# Patient Record
Sex: Male | Born: 1945 | Race: White | Hispanic: No | Marital: Married | Smoking: Never smoker
Health system: Southern US, Community
[De-identification: ages and names within clinical notes are randomized; demographics above are authoritative.]

## PROBLEM LIST (undated history)

## (undated) DIAGNOSIS — C4491 Basal cell carcinoma of skin, unspecified: Secondary | ICD-10-CM

## (undated) DIAGNOSIS — M1990 Unspecified osteoarthritis, unspecified site: Secondary | ICD-10-CM

## (undated) DIAGNOSIS — C61 Malignant neoplasm of prostate: Secondary | ICD-10-CM

## (undated) DIAGNOSIS — N400 Enlarged prostate without lower urinary tract symptoms: Secondary | ICD-10-CM

## (undated) DIAGNOSIS — Z5189 Encounter for other specified aftercare: Secondary | ICD-10-CM

## (undated) DIAGNOSIS — I10 Essential (primary) hypertension: Secondary | ICD-10-CM

## (undated) DIAGNOSIS — K219 Gastro-esophageal reflux disease without esophagitis: Secondary | ICD-10-CM

## (undated) DIAGNOSIS — N189 Chronic kidney disease, unspecified: Secondary | ICD-10-CM

## (undated) HISTORY — PX: HAND SURGERY: SHX662

## (undated) HISTORY — DX: Unspecified osteoarthritis, unspecified site: M19.90

## (undated) HISTORY — DX: Malignant neoplasm of prostate: C61

## (undated) HISTORY — DX: Gastro-esophageal reflux disease without esophagitis: K21.9

## (undated) HISTORY — DX: Encounter for other specified aftercare: Z51.89

## (undated) HISTORY — DX: Benign prostatic hyperplasia without lower urinary tract symptoms: N40.0

## (undated) HISTORY — DX: Basal cell carcinoma of skin, unspecified: C44.91

## (undated) HISTORY — DX: Essential (primary) hypertension: I10

## (undated) HISTORY — DX: Chronic kidney disease, unspecified: N18.9

---
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
## 1970-01-13 HISTORY — PX: TONSILLECTOMY: SHX5217

## 1983-01-14 HISTORY — PX: RETINAL DETACHMENT SURGERY: SHX105

## 1996-12-07 ENCOUNTER — Encounter: Payer: Self-pay | Admitting: Internal Medicine

## 2003-12-11 ENCOUNTER — Ambulatory Visit: Payer: Self-pay | Admitting: Internal Medicine

## 2004-01-14 HISTORY — PX: COLONOSCOPY: SHX174

## 2004-05-22 ENCOUNTER — Ambulatory Visit: Payer: Self-pay | Admitting: Family Medicine

## 2004-05-29 ENCOUNTER — Ambulatory Visit: Payer: Self-pay | Admitting: Internal Medicine

## 2004-06-14 ENCOUNTER — Ambulatory Visit: Payer: Self-pay | Admitting: Internal Medicine

## 2004-06-24 ENCOUNTER — Ambulatory Visit: Payer: Self-pay | Admitting: Internal Medicine

## 2004-06-24 LAB — HM COLONOSCOPY: HM Colonoscopy: NORMAL

## 2004-11-27 ENCOUNTER — Ambulatory Visit: Payer: Self-pay | Admitting: Internal Medicine

## 2005-02-05 ENCOUNTER — Ambulatory Visit: Payer: Self-pay | Admitting: Family Medicine

## 2005-03-06 ENCOUNTER — Ambulatory Visit: Payer: Self-pay | Admitting: Internal Medicine

## 2005-05-26 ENCOUNTER — Ambulatory Visit: Payer: Self-pay | Admitting: Internal Medicine

## 2005-05-28 ENCOUNTER — Ambulatory Visit: Payer: Self-pay | Admitting: Ophthalmology

## 2005-07-28 ENCOUNTER — Ambulatory Visit: Payer: Self-pay | Admitting: Family Medicine

## 2005-12-01 ENCOUNTER — Ambulatory Visit: Payer: Self-pay | Admitting: Internal Medicine

## 2006-01-30 ENCOUNTER — Ambulatory Visit: Payer: Self-pay | Admitting: Family Medicine

## 2006-05-25 DIAGNOSIS — I10 Essential (primary) hypertension: Secondary | ICD-10-CM | POA: Insufficient documentation

## 2006-06-01 ENCOUNTER — Ambulatory Visit: Payer: Self-pay | Admitting: Internal Medicine

## 2006-07-08 ENCOUNTER — Ambulatory Visit: Payer: Self-pay | Admitting: Internal Medicine

## 2006-07-08 DIAGNOSIS — R071 Chest pain on breathing: Secondary | ICD-10-CM | POA: Insufficient documentation

## 2006-07-09 ENCOUNTER — Encounter (INDEPENDENT_AMBULATORY_CARE_PROVIDER_SITE_OTHER): Payer: Self-pay | Admitting: *Deleted

## 2006-07-14 ENCOUNTER — Telehealth (INDEPENDENT_AMBULATORY_CARE_PROVIDER_SITE_OTHER): Payer: Self-pay | Admitting: *Deleted

## 2006-10-05 ENCOUNTER — Ambulatory Visit: Payer: Self-pay | Admitting: Internal Medicine

## 2006-10-13 ENCOUNTER — Encounter: Payer: Self-pay | Admitting: Internal Medicine

## 2006-10-13 ENCOUNTER — Ambulatory Visit: Payer: Self-pay

## 2006-11-09 ENCOUNTER — Ambulatory Visit: Payer: Self-pay | Admitting: Family Medicine

## 2006-12-14 ENCOUNTER — Ambulatory Visit: Payer: Self-pay | Admitting: Internal Medicine

## 2006-12-14 DIAGNOSIS — M1990 Unspecified osteoarthritis, unspecified site: Secondary | ICD-10-CM | POA: Insufficient documentation

## 2006-12-15 LAB — CONVERTED CEMR LAB
LDL Cholesterol: 93 mg/dL (ref 0–99)
PSA: 5.29 ng/mL — ABNORMAL HIGH (ref 0.10–4.00)
VLDL: 13 mg/dL (ref 0–40)

## 2006-12-23 ENCOUNTER — Telehealth: Payer: Self-pay | Admitting: Internal Medicine

## 2006-12-24 ENCOUNTER — Ambulatory Visit: Payer: Self-pay | Admitting: Family Medicine

## 2007-03-02 ENCOUNTER — Ambulatory Visit: Payer: Self-pay | Admitting: Internal Medicine

## 2007-03-03 LAB — CONVERTED CEMR LAB
PSA, Free: 0.7 ng/mL
PSA: 3.38 ng/mL (ref 0.10–4.00)

## 2007-06-15 ENCOUNTER — Ambulatory Visit: Payer: Self-pay | Admitting: Internal Medicine

## 2007-07-08 ENCOUNTER — Telehealth (INDEPENDENT_AMBULATORY_CARE_PROVIDER_SITE_OTHER): Payer: Self-pay | Admitting: *Deleted

## 2007-07-12 ENCOUNTER — Emergency Department: Payer: Self-pay | Admitting: Emergency Medicine

## 2007-09-08 ENCOUNTER — Ambulatory Visit: Payer: Self-pay

## 2007-10-14 HISTORY — PX: ROTATOR CUFF REPAIR: SHX139

## 2007-12-06 ENCOUNTER — Ambulatory Visit: Payer: Self-pay | Admitting: Family Medicine

## 2007-12-21 ENCOUNTER — Ambulatory Visit: Payer: Self-pay | Admitting: Internal Medicine

## 2007-12-22 LAB — CONVERTED CEMR LAB
AST: 24 units/L (ref 0–37)
Basophils Absolute: 0 10*3/uL (ref 0.0–0.1)
CO2: 30 meq/L (ref 19–32)
Calcium: 8.9 mg/dL (ref 8.4–10.5)
Glucose, Bld: 94 mg/dL (ref 70–99)
Hemoglobin: 15.1 g/dL (ref 13.0–17.0)
Lymphocytes Relative: 18.6 % (ref 12.0–46.0)
MCHC: 34.5 g/dL (ref 30.0–36.0)
Neutro Abs: 5 10*3/uL (ref 1.4–7.7)
Neutrophils Relative %: 68.5 % (ref 43.0–77.0)
RDW: 12.2 % (ref 11.5–14.6)
Sodium: 138 meq/L (ref 135–145)
TSH: 3.03 microintl units/mL (ref 0.35–5.50)
Total Bilirubin: 1.2 mg/dL (ref 0.3–1.2)

## 2008-02-28 ENCOUNTER — Ambulatory Visit: Payer: Self-pay | Admitting: Family Medicine

## 2008-04-18 ENCOUNTER — Ambulatory Visit: Payer: Self-pay | Admitting: Family Medicine

## 2008-04-18 DIAGNOSIS — M545 Low back pain, unspecified: Secondary | ICD-10-CM | POA: Insufficient documentation

## 2008-04-18 LAB — CONVERTED CEMR LAB
Bilirubin Urine: NEGATIVE
Blood in Urine, dipstick: NEGATIVE
Glucose, Urine, Semiquant: NEGATIVE
Ketones, urine, test strip: NEGATIVE
Specific Gravity, Urine: 1.02
pH: 6

## 2008-04-21 ENCOUNTER — Encounter: Admission: RE | Admit: 2008-04-21 | Discharge: 2008-04-21 | Payer: Self-pay | Admitting: Family Medicine

## 2008-04-25 ENCOUNTER — Encounter (INDEPENDENT_AMBULATORY_CARE_PROVIDER_SITE_OTHER): Payer: Self-pay | Admitting: *Deleted

## 2008-06-13 ENCOUNTER — Telehealth: Payer: Self-pay | Admitting: Internal Medicine

## 2008-07-04 ENCOUNTER — Ambulatory Visit: Payer: Self-pay | Admitting: Internal Medicine

## 2008-07-04 DIAGNOSIS — R5383 Other fatigue: Secondary | ICD-10-CM

## 2008-07-04 DIAGNOSIS — R5381 Other malaise: Secondary | ICD-10-CM | POA: Insufficient documentation

## 2009-01-02 ENCOUNTER — Encounter: Payer: Self-pay | Admitting: Internal Medicine

## 2009-01-03 ENCOUNTER — Ambulatory Visit: Payer: Self-pay | Admitting: Internal Medicine

## 2009-01-03 DIAGNOSIS — N401 Enlarged prostate with lower urinary tract symptoms: Secondary | ICD-10-CM

## 2009-01-03 DIAGNOSIS — N138 Other obstructive and reflux uropathy: Secondary | ICD-10-CM | POA: Insufficient documentation

## 2009-01-03 DIAGNOSIS — L909 Atrophic disorder of skin, unspecified: Secondary | ICD-10-CM | POA: Insufficient documentation

## 2009-01-03 DIAGNOSIS — L919 Hypertrophic disorder of the skin, unspecified: Secondary | ICD-10-CM

## 2009-01-03 DIAGNOSIS — R079 Chest pain, unspecified: Secondary | ICD-10-CM | POA: Insufficient documentation

## 2009-01-09 LAB — CONVERTED CEMR LAB
ALT: 30 U/L
AST: 29 U/L
Albumin: 4.1 g/dL
Alkaline Phosphatase: 57 U/L
BUN: 13 mg/dL
Basophils Absolute: 0 10*3/uL
Basophils Relative: 0.5 %
Bilirubin, Direct: 0.2 mg/dL
CO2: 31 meq/L
Calcium: 9.2 mg/dL
Chloride: 105 meq/L
Creatinine, Ser: 1.4 mg/dL
Eosinophils Absolute: 0.1 10*3/uL
Eosinophils Relative: 2.2 %
GFR calc non Af Amer: 54.37 mL/min
Glucose, Bld: 94 mg/dL
HCT: 51.1 %
Hemoglobin: 16.4 g/dL
Lymphocytes Relative: 19.3 %
Lymphs Abs: 1.3 10*3/uL
MCHC: 32.2 g/dL
MCV: 97 fL
Monocytes Absolute: 0.6 10*3/uL
Monocytes Relative: 9.8 %
Neutro Abs: 4.6 10*3/uL
Neutrophils Relative %: 68.2 %
PSA: 5.52 ng/mL — ABNORMAL HIGH
Phosphorus: 3.5 mg/dL
Platelets: 207 10*3/uL
Potassium: 4.6 meq/L
RBC: 5.26 M/uL
RDW: 12.2 %
Sodium: 141 meq/L
TSH: 4.83 u[IU]/mL
Total Bilirubin: 1.1 mg/dL
Total Protein: 7.1 g/dL
WBC: 6.6 10*3/uL

## 2009-01-13 HISTORY — PX: PROSTATE BIOPSY: SHX241

## 2009-01-18 ENCOUNTER — Ambulatory Visit: Payer: Self-pay | Admitting: Family Medicine

## 2009-01-18 LAB — CONVERTED CEMR LAB
Epithelial cells, urine: 0 /lpf
Ketones, urine, test strip: NEGATIVE
Nitrite: NEGATIVE
Protein, U semiquant: NEGATIVE
Urobilinogen, UA: 0.2
WBC, UA: 0 cells/hpf

## 2009-01-19 ENCOUNTER — Telehealth: Payer: Self-pay | Admitting: Internal Medicine

## 2009-01-31 ENCOUNTER — Encounter: Payer: Self-pay | Admitting: Internal Medicine

## 2009-07-06 ENCOUNTER — Ambulatory Visit: Payer: Self-pay | Admitting: Internal Medicine

## 2009-09-27 ENCOUNTER — Telehealth: Payer: Self-pay | Admitting: Internal Medicine

## 2009-12-26 ENCOUNTER — Ambulatory Visit: Payer: Self-pay | Admitting: Internal Medicine

## 2009-12-26 DIAGNOSIS — B029 Zoster without complications: Secondary | ICD-10-CM | POA: Insufficient documentation

## 2010-01-11 ENCOUNTER — Ambulatory Visit: Payer: Self-pay | Admitting: Internal Medicine

## 2010-01-15 LAB — CONVERTED CEMR LAB
Albumin: 3.9 g/dL (ref 3.5–5.2)
Alkaline Phosphatase: 51 units/L (ref 39–117)
BUN: 15 mg/dL (ref 6–23)
Basophils Absolute: 0 10*3/uL (ref 0.0–0.1)
Basophils Relative: 0.4 % (ref 0.0–3.0)
CO2: 30 meq/L (ref 19–32)
Calcium: 9 mg/dL (ref 8.4–10.5)
Chloride: 103 meq/L (ref 96–112)
Cholesterol: 125 mg/dL (ref 0–200)
Eosinophils Absolute: 0.2 10*3/uL (ref 0.0–0.7)
HCT: 46.4 % (ref 39.0–52.0)
Hemoglobin: 15.9 g/dL (ref 13.0–17.0)
LDL Cholesterol: 73 mg/dL (ref 0–99)
Lymphs Abs: 1.6 10*3/uL (ref 0.7–4.0)
MCHC: 34.2 g/dL (ref 30.0–36.0)
MCV: 95.6 fL (ref 78.0–100.0)
Neutro Abs: 4.5 10*3/uL (ref 1.4–7.7)
Phosphorus: 3.3 mg/dL (ref 2.3–4.6)
Potassium: 4.7 meq/L (ref 3.5–5.1)
RBC: 4.86 M/uL (ref 4.22–5.81)
RDW: 13.4 % (ref 11.5–14.6)
TSH: 4.68 microintl units/mL (ref 0.35–5.50)
Total Protein: 6.8 g/dL (ref 6.0–8.3)
Triglycerides: 104 mg/dL (ref 0.0–149.0)

## 2010-02-10 LAB — CONVERTED CEMR LAB
ALT: 22 units/L (ref 0–53)
Alkaline Phosphatase: 60 units/L (ref 39–117)
Basophils Relative: 0.3 % (ref 0.0–1.0)
Eosinophils Relative: 1.7 % (ref 0.0–5.0)
GFR calc Af Amer: 79 mL/min
Glucose, Bld: 98 mg/dL (ref 70–99)
HCT: 43.5 % (ref 39.0–52.0)
Lymphocytes Relative: 16.1 % (ref 12.0–46.0)
Neutrophils Relative %: 73.6 % (ref 43.0–77.0)
Phosphorus: 3.3 mg/dL (ref 2.3–4.6)
Platelets: 217 10*3/uL (ref 150–400)
Potassium: 4.3 meq/L (ref 3.5–5.1)
RBC: 4.71 M/uL (ref 4.22–5.81)
Sodium: 144 meq/L (ref 135–145)
Total Bilirubin: 1 mg/dL (ref 0.3–1.2)
Total Protein: 6.3 g/dL (ref 6.0–8.3)
WBC: 7.1 10*3/uL (ref 4.5–10.5)

## 2010-02-13 NOTE — Progress Notes (Signed)
Summary: finasteride  Phone Note Call from Patient Call back at Home Phone [phone]   Caller: Patient Call For: Cindee Salt MD Summary of Call: Patient says that you had discussed putting him on finasteride at his last office visit. He is asking if he could have this called to [facility] in [place].  Initial call taken by: Melody Comas,  September 27, 2009 1:46 PM  Follow-up for Phone Call        Let him know that Rx was sent Have him call if he has more trouble voiding with the change in meds Follow-up by: Cindee Salt MD,  September 27, 2009 2:50 PM  Additional Follow-up for Phone Call Additional follow up Details #1::        LMOM at home that rx was called in and to call if any problems. Additional Follow-up by: Mervin Hack CMA Duncan Dull),  September 27, 2009 4:20 PM    New/Updated Medications: FINASTERIDE 5 MG TABS (FINASTERIDE) 1 tab daily to reduce prostate size Prescriptions: FINASTERIDE 5 MG TABS (FINASTERIDE) 1 tab daily to reduce prostate size  #30 x 11   Entered and Authorized by:   Cindee Salt MD   Signed by:   Cindee Salt MD on 09/27/2009   Method used:   Electronically to        [facility] (retail)       [address]       [place], [place]  [address]       Ph: [phone]       Fax: [phone]   RxID:   [id number]
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
## 2010-02-13 NOTE — Progress Notes (Signed)
Summary: Rx Flomax  Phone Note Call from Patient   Caller: Patient Call For: Cindee Salt MD Summary of Call: Patient called says he saw Billie on yesterday and she sent in a Rx for him for Flomax.  He said he went to pharmacy to get Rx and it was not there.  Called [facility] and spoke to the pharmacist, the Rx was sent electronically and it has been filled.  Patient notified.   Initial call taken by: Linde Gillis CMA (AAMA),  January 19, 2009 11:22 AM

## 2010-02-13 NOTE — Assessment & Plan Note (Signed)
Summary: PROSTATE/DS   Vital Signs:  Patient profile:   65 year old male Height:      64 inches Weight:      194 pounds BMI:     33.42 Temp:     98.7 degrees F oral Pulse rate:   64 / minute Pulse rhythm:   regular BP sitting:   118 / 78  (left arm) Cuff size:   large  Vitals Entered By: Lewanda Rife LPN (January 18, 2009 2:28 PM)  CC:  prostate and void small amts frequently and elevated PSA.  History of Present Illness: Here due to small frequent voidings--worse past 3days--more frequent, no fever or chills --testicles "uncomfortable" past few days PSA elevated on 01/03/2009 to 5.552, up from 3.38 on 03/02/2007.--to repeat PAS in 3 mo and refer if elevated is the current plan  pain in RUQ when bends over tie shoes  sudden onset of diarrhea thiis morning after bkft--has resolved  drinks coffee  ~16oz once daily            tea--16 oz once daily            no sodas            some chocolate  Allergies (verified): No Known Drug Allergies  Past History:  Past Medical History: Reviewed history from 01/03/2009 and no changes required. GERD Hypertension Osteoarthritis Benign prostatic hypertrophy  Review of Systems      See HPI  Physical Exam  General:  alert, well-developed, well-nourished, and well-hydrated.   Rectal:  normal sphincter tone and no masses.   Prostate:  no nodules and no asymmetry.  not tender Neurologic:  alert & oriented X3 and gait normal.   Psych:  normally interactive and slightly anxious.     Impression & Recommendations:  Problem # 1:  BENIGN PROSTATIC HYPERTROPHY (ICD-600.00) Assessment Deteriorated  discussed presentation with Dr Erick Alley start on Flomax now will refer to urology due to increased retension His updated medication list for this problem includes:    Flomax 0.4 Mg Caps (Tamsulosin hcl) .Marland Kitchen... Take 1 daily for bph  Orders: UA Dipstick W/ Micro (manual) (04540)  Problem # 2:  ELEVATED PROSTATE SPECIFIC ANTIGEN  (ICD-790.93) Assessment: Comment Only referral to urology for eval Orders: Urology Referral (Urology)  Complete Medication List: 1)  Aspirin 81 Mg Tabs (Aspirin) .... Take one by mouth daily 2)  Atenolol 25 Mg Tabs (Atenolol) .... Take 1/2 by mouth daily 3)  Omeprazole 20 Mg Tbec (Omeprazole) .... Take 1 tab daily 4)  Amlodipine Besylate 5 Mg Tabs (Amlodipine besylate) .Marland Kitchen.. 1 tab daily for high blood pressure 5)  Flomax 0.4 Mg Caps (Tamsulosin hcl) .... Take 1 daily for bph  Patient Instructions: 1)  refer to urology Prescriptions: FLOMAX 0.4 MG CAPS (TAMSULOSIN HCL) take 1 daily for BPH  #30 x 1   Entered and Authorized by:   Gildardo Griffes FNP   Signed by:   Delilah Shan CMA (AAMA) on 01/19/2009   Method used:   Electronically to        [facility] (retail)       [address]       [place], [place]  [address]       Ph: [phone]       Fax: [phone]   RxID:   [id number]   Current Allergies (reviewed today): No known allergies   Laboratory Results   Urine  Tests  Date/Time Received: January 18, 2009 2:51 PM  Date/Time Reported: January 18, 2009 2:51 PM   Routine Urinalysis   Color: yellow Appearance: Clear Glucose: negative   (Normal Range: Negative) Bilirubin: negative   (Normal Range: Negative) Ketone: negative   (Normal Range: Negative) Spec. Gravity: 1.010   (Normal Range: 1.003-1.035) Blood: trace-lysed   (Normal Range: Negative) pH: 6.0   (Normal Range: 5.0-8.0) Protein: negative   (Normal Range: Negative) Urobilinogen: 0.2   (Normal Range: 0-1) Nitrite: negative   (Normal Range: Negative) Leukocyte Esterace: negative   (Normal Range: Negative)  Urine Microscopic WBC/HPF: 0 RBC/HPF: 0-1 Bacteria/HPF: 0 Epithelial/HPF: 0        Laboratory Results   Urine Tests    Routine Urinalysis   Color: yellow Appearance: Clear Glucose: negative   (Normal Range: Negative) Bilirubin: negative   (Normal  Range: Negative) Ketone: negative   (Normal Range: Negative) Spec. Gravity: 1.010   (Normal Range: 1.003-1.035) Blood: trace-lysed   (Normal Range: Negative) pH: 6.0   (Normal Range: 5.0-8.0) Protein: negative   (Normal Range: Negative) Urobilinogen: 0.2   (Normal Range: 0-1) Nitrite: negative   (Normal Range: Negative) Leukocyte Esterace: negative   (Normal Range: Negative)  Urine Microscopic WBC/hpf: 0 RBC/hpf: 0-1 Bacteria: 0 Epithelial: 0
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
## 2010-02-14 NOTE — Assessment & Plan Note (Signed)
Summary: ?RASH ON CHEST AND BACK/CLE   Vital Signs:  Patient profile:   65 year old male Height:      64 inches Weight:      192 pounds BMI:     33.08 Temp:     99.4 degrees F oral Pulse rate:   60 / minute Pulse rhythm:   regular BP sitting:   126 / 80  (left arm) Cuff size:   large  Vitals Entered By: Selena Batten Dance CMA Duncan Dull) (December 26, 2009 9:23 AM) CC: Rash on chest and back   History of Present Illness: CC: rash  4d h/o rash on back and chest.  Started feeling irritation on back as well as burning.  not really itchy. more burning.  h/o chicken pox in past  No h/o previous rash.  No change in detergents/soap/shampoo.  Does go outside often, but not recently in brush/woods.  No fevers/chills, abd pain, n/v, trouble breathing or oral lesions.  New medicine finasteride about 3 months ago started.  (previously on jalyn)  + coworker with shingles last week.  Current Medications (verified): 1)  Atenolol 25 Mg Tabs (Atenolol) .... Take 1/2 By Mouth Daily 2)  Amlodipine Besylate 5 Mg Tabs (Amlodipine Besylate) .Marland Kitchen.. 1 Tab Daily For High Blood Pressure 3)  Aspirin 81 Mg Tabs (Aspirin) .... Take One By Mouth Daily 4)  Omeprazole 20 Mg Tbec (Omeprazole) .... Take 1 Tab Daily 5)  Finasteride 5 Mg Tabs (Finasteride) .Marland Kitchen.. 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   Lungs:  normal respiratory effort and normal breath sounds.   Heart:  normal rate, regular rhythm, no murmur, and no gallop.   Skin:  erythematous cluster of blisters  R T4 dermatomal distribution chest, axilla, back.   Impression & Recommendations:  Problem # 1:  SHINGLES (ICD-053.9) 4-5 days into course.  treat with acyclovir to help prevent PHN, ibuprofen for discomfort.  handout on shingles provided with care instructions.  Complete Medication List: 1)  Atenolol 25 Mg Tabs (Atenolol) .... Take 1/2 by mouth daily 2)  Amlodipine Besylate 5 Mg Tabs (Amlodipine besylate) .Marland Kitchen.. 1 tab daily for high blood pressure 3)  Aspirin 81 Mg Tabs (Aspirin) .... Take one by mouth daily 4)  Omeprazole 20 Mg Tbec (Omeprazole) .... Take 1 tab daily 5)  Finasteride 5 Mg Tabs (Finasteride) .Marland Kitchen.. 1 tab daily to reduce prostate size 6)  Acyclovir 800 Mg Tabs (Acyclovir) .... One by mouth 5x/day x 7 days  Patient Instructions: 1)  You have shingles. 2)  Take acyclovir 5 x/day for 7 days. 3)  Handout provided. 4)  Ibuprofen for pain/discomfort. 5)  Keep area covered and as clean and dry as able. 6)  Let us know if not getting better as expected. Prescriptions: ACYCLOVIR 800 MG TABS (ACYCLOVIR) one by mouth 5x/day x 7 days  #35 x 0   Entered and Authorized by:   Eustaquio Boyden  MD   Signed by:   Eustaquio Boyden  MD on 12/26/2009   Method used:   Electronically to  [facility] (retail)       [address]       [place], [place]  [address]       Ph: [phone]       Fax: [phone]   RxID:   [id number]    Orders Added: 1)  Est. Patient Level III [40102]    Current Allergies (reviewed today): No known allergies

## 2010-04-22 ENCOUNTER — Other Ambulatory Visit: Payer: Self-pay | Admitting: *Deleted

## 2010-04-22 MED ORDER — OMEPRAZOLE MAGNESIUM 20 MG PO TBEC: 20.0000 mg | DELAYED_RELEASE_TABLET | Freq: Every day | ORAL | Status: DC

## 2010-05-30 ENCOUNTER — Other Ambulatory Visit: Payer: Self-pay | Admitting: *Deleted

## 2010-05-30 MED ORDER — AMLODIPINE BESYLATE 5 MG PO TABS: 5.0000 mg | ORAL_TABLET | Freq: Every day | ORAL | Status: DC

## 2010-07-16 ENCOUNTER — Encounter: Payer: Self-pay | Admitting: Internal Medicine

## 2010-07-18 ENCOUNTER — Other Ambulatory Visit: Payer: Self-pay | Admitting: *Deleted

## 2010-07-18 MED ORDER — ATENOLOL 25 MG PO TABS: ORAL_TABLET | ORAL | Status: DC

## 2010-07-18 NOTE — Telephone Encounter (Signed)
rx sent to pharmacy by e-script  

## 2010-07-19 ENCOUNTER — Ambulatory Visit: Payer: Self-pay | Admitting: Internal Medicine

## 2010-07-19 ENCOUNTER — Encounter: Payer: Self-pay | Admitting: Internal Medicine

## 2010-07-19 ENCOUNTER — Ambulatory Visit (INDEPENDENT_AMBULATORY_CARE_PROVIDER_SITE_OTHER): Payer: BC Managed Care – PPO | Admitting: Internal Medicine

## 2010-07-19 VITALS — BP 133/78 | HR 54 | Temp 98.3°F | Ht 64.0 in | Wt 191.0 lb

## 2010-07-19 DIAGNOSIS — K219 Gastro-esophageal reflux disease without esophagitis: Secondary | ICD-10-CM | POA: Insufficient documentation

## 2010-07-19 DIAGNOSIS — N400 Enlarged prostate without lower urinary tract symptoms: Secondary | ICD-10-CM

## 2010-07-19 DIAGNOSIS — I10 Essential (primary) hypertension: Secondary | ICD-10-CM

## 2010-07-19 DIAGNOSIS — R079 Chest pain, unspecified: Secondary | ICD-10-CM

## 2010-07-19 MED ORDER — ATENOLOL 25 MG PO TABS: ORAL_TABLET | ORAL | Status: DC

## 2010-07-19 NOTE — Assessment & Plan Note (Signed)
Does okay on the med 

## 2010-07-19 NOTE — Assessment & Plan Note (Signed)
Atypical pain Stress test in past EKG shows bradycardia from beta blocker and J point elevation--no ischemic changes No further workup unless more worrisome symtpoms--discussed with him

## 2010-07-19 NOTE — Assessment & Plan Note (Signed)
BP Readings from Last 3 Encounters:  07/19/10 133/78  01/11/10 148/90  12/26/09 126/80   Good control  No changes needed

## 2010-07-19 NOTE — Assessment & Plan Note (Signed)
Voids okay on finasteride No change

## 2010-07-19 NOTE — Progress Notes (Signed)
Subjective:    Patient ID: Bobby Woods, male    DOB: [birth year], 65 y.o.   MRN: [id number]  HPI Doing okay Still having some stress with wife's issues "I have been taking care of everyone but me"  Has had some chest pains since last visit--none in several weeks Not exertional Feels like aching on left Would last for hours No SOB No ankle edema but some swelling above ankles after sitting all day at work  Occ checks BP Usually good 120's/80's  Voiding okay Nocturia 1-2 at night Feels the finasteride has helped  Some spine arthritis Other aches and pains No meds now  Current Outpatient Prescriptions on File Prior to Visit  Medication Sig Dispense Refill  . amLODipine (NORVASC) 5 MG tablet Take 1 tablet (5 mg total) by mouth daily.  30 tablet  11  . aspirin 81 MG tablet Take 81 mg by mouth daily.        Marland Kitchen atenolol (TENORMIN) 25 MG tablet Take 1/2 tablet by mouth once daily  45 tablet  0  . finasteride (PROSCAR) 5 MG tablet Take 5 mg by mouth daily.        Marland Kitchen omeprazole (PRILOSEC OTC) 20 MG tablet Take 1 tablet (20 mg total) by mouth daily.  28 tablet  1  . DISCONTD: traMADol (ULTRAM) 50 MG tablet Take 50-100 mg by mouth at bedtime as needed.          No Known Allergies  Past Medical History  Diagnosis Date  . GERD (gastroesophageal reflux disease)   . Hypertension   . Arthritis   . BPH (benign prostatic hypertrophy)     Past Surgical History  Procedure Date  . Inguinal hernia repair 1965  . Tonsillectomy 1972  . Retinal detachment surgery 1985    left, right 05/07  . Rotator cuff repair 10/09    right  . Prostate biopsy 2011    negative    Family History  Problem Relation Age of Onset  . Cancer Father     prostate  . Cancer Paternal Uncle     prostate cancer    History   Social History  . Marital Status: Married    Spouse Name: N/A    Number of Children: 3  . Years of Education: N/A   Occupational History  . Art therapist    Social  History Main Topics  . Smoking status: Never Smoker   . Smokeless tobacco: Never Used  . Alcohol Use: No  . Drug Use: No  . Sexually Active: Not on file   Other Topics Concern  . Not on file   Social History Narrative  . No narrative on file   Review of Systems Appetite is fine Weight is stable Generally sleeps okay Hasn't had time to exercise with caregiving for dad and wife No reflux problems as long as he takes the omeprazole     Objective:   Physical Exam  Constitutional: He appears well-developed and well-nourished. No distress.  Neck: Normal range of motion. Neck supple. No thyromegaly present.  Cardiovascular: Normal rate, regular rhythm, normal heart sounds and intact distal pulses.  Exam reveals no gallop.   No murmur heard. Pulmonary/Chest: Effort normal and breath sounds normal. No respiratory distress. He has no wheezes. He has no rales.  Abdominal: Soft. He exhibits no mass. There is no tenderness.  Musculoskeletal: Normal range of motion. He exhibits no edema and no tenderness.  Lymphadenopathy:    He has no cervical  adenopathy.  Psychiatric: He has a normal mood and affect. His behavior is normal. Judgment and thought content normal.          Assessment & Plan:

## 2010-07-23 ENCOUNTER — Other Ambulatory Visit: Payer: Self-pay | Admitting: *Deleted

## 2010-07-23 MED ORDER — OMEPRAZOLE MAGNESIUM 20 MG PO TBEC: 20.0000 mg | DELAYED_RELEASE_TABLET | Freq: Every day | ORAL | Status: DC

## 2010-07-23 NOTE — Telephone Encounter (Signed)
Received faxed refill request from pharmacy. Rx sent to pharmacy electronically. 

## 2010-08-12 ENCOUNTER — Encounter: Payer: Self-pay | Admitting: Family Medicine

## 2010-08-12 ENCOUNTER — Ambulatory Visit (INDEPENDENT_AMBULATORY_CARE_PROVIDER_SITE_OTHER): Payer: BC Managed Care – PPO | Admitting: Family Medicine

## 2010-08-12 DIAGNOSIS — B029 Zoster without complications: Secondary | ICD-10-CM

## 2010-08-12 MED ORDER — VALACYCLOVIR HCL 1 G PO TABS: 1000.0000 mg | ORAL_TABLET | Freq: Three times a day (TID) | ORAL | Status: DC

## 2010-08-12 NOTE — Assessment & Plan Note (Signed)
Likely atypical presentation of zoster.  Start valtrex, out of work until crusted and f/u prn.  He agrees.

## 2010-08-12 NOTE — Patient Instructions (Signed)
Start the valtrex today.  Out of work until rash is scabbed over.  Let us know if you have other concerns.  Take care.

## 2010-08-12 NOTE — Progress Notes (Signed)
Last Wednesday or Thursday-he felt burning and stinging in the L upper back (this was the site of the prev zoster), but no rash.  Now with rash on B medial thighs and medial forearms.  Itches and stings.  No help with topical cortisone.   Feels like shingles.    Feeling well o/w.  Meds, vitals, and allergies reviewed.   ROS: See HPI.  Otherwise, noncontributory.  nad ncat Mmm Back and torso wnl on skin exam except for hypersensitivity on L upper back Medial thighs x2  and forearms x2 with dermatomal rash- erythematous blanching maculopapular rash with occ vesicles

## 2010-08-16 ENCOUNTER — Telehealth: Payer: Self-pay | Admitting: *Deleted

## 2010-08-16 NOTE — Telephone Encounter (Signed)
I would continue this for now.  If he continues to have troubles or new symptoms, then I'd like to see him next week.  Thanks.

## 2010-08-16 NOTE — Telephone Encounter (Signed)
Pt has been taking valtrex for shingles since Monday.  He continues to get new bumps.  He's taking 1000 mg's 3 times a day.  Any suggestions for what he can do.

## 2010-08-16 NOTE — Telephone Encounter (Signed)
Left message on machine asking pt to call back. 

## 2010-08-20 NOTE — Telephone Encounter (Signed)
Left message on machine at home for patient to return call. 

## 2010-08-21 NOTE — Telephone Encounter (Signed)
.  left message to have patient return my call.  

## 2010-08-21 NOTE — Telephone Encounter (Signed)
I usually advise waiting a while after an attack (like a year) before getting the vaccine

## 2010-08-21 NOTE — Telephone Encounter (Signed)
Patient advised as instructed via telephone.  He stated that the blisters are scabbing over, there is no redness like there was before.  He is still itching a little but he feels better than he did before.  He wanted to know if he should at some point get the Shingles vaccine?  Please advise.

## 2010-08-22 NOTE — Telephone Encounter (Signed)
Spoke with patient and advised results   

## 2010-09-10 ENCOUNTER — Other Ambulatory Visit: Payer: Self-pay | Admitting: *Deleted

## 2010-09-10 MED ORDER — AMLODIPINE BESYLATE 5 MG PO TABS: 5.0000 mg | ORAL_TABLET | Freq: Every day | ORAL | Status: DC

## 2010-09-10 NOTE — Telephone Encounter (Signed)
rx sent to pharmacy by e-script  

## 2010-10-24 ENCOUNTER — Other Ambulatory Visit: Payer: Self-pay | Admitting: *Deleted

## 2010-10-24 MED ORDER — FINASTERIDE 5 MG PO TABS: 5.0000 mg | ORAL_TABLET | Freq: Every day | ORAL | Status: DC

## 2010-12-09 ENCOUNTER — Other Ambulatory Visit: Payer: Self-pay | Admitting: *Deleted

## 2010-12-09 ENCOUNTER — Other Ambulatory Visit: Payer: Self-pay | Admitting: Family Medicine

## 2010-12-09 ENCOUNTER — Encounter: Payer: Self-pay | Admitting: Family Medicine

## 2010-12-09 ENCOUNTER — Encounter: Payer: Self-pay | Admitting: *Deleted

## 2010-12-09 ENCOUNTER — Ambulatory Visit (INDEPENDENT_AMBULATORY_CARE_PROVIDER_SITE_OTHER): Payer: BC Managed Care – PPO | Admitting: Family Medicine

## 2010-12-09 VITALS — BP 150/90 | HR 93 | Temp 98.7°F | Ht 64.0 in | Wt 189.0 lb

## 2010-12-09 DIAGNOSIS — J209 Acute bronchitis, unspecified: Secondary | ICD-10-CM

## 2010-12-09 DIAGNOSIS — I10 Essential (primary) hypertension: Secondary | ICD-10-CM

## 2010-12-09 MED ORDER — AZITHROMYCIN 250 MG PO TABS: ORAL_TABLET | ORAL | Status: AC

## 2010-12-09 NOTE — Progress Notes (Signed)
  Subjective:    Patient ID: Bobby Woods, male    DOB: February 09, 1945, 65 y.o.   MRN: [id number]  Sinusitis The current episode started 1 to 4 weeks ago. There has been no fever. He is experiencing no pain. Associated symptoms include congestion, coughing, headaches and a sore throat. Pertinent negatives include no chills, ear pain, shortness of breath, sinus pressure or sneezing. (Dry cough, occ yellow productive) Treatments tried: ricola cough drops.   4 family members with illness at home..bronchitis or sinus infection. Was around dust over weekend.  Hypertension:  Elevated today...has not taken medication yesterday or today yet. Using medication without problems or lightheadedness:  Chest pain with exertion:None Edema:None Short of breath:None Average home BPs: Not checking. Other issues:    Review of Systems  Constitutional: Negative for chills.  HENT: Positive for congestion and sore throat. Negative for ear pain, sneezing and sinus pressure.   Respiratory: Positive for cough. Negative for shortness of breath.   Neurological: Positive for headaches.       Objective:   Physical Exam  Constitutional: Vital signs are normal. He appears well-developed and well-nourished.  Non-toxic appearance. He does not appear ill. No distress.  HENT:  Head: Normocephalic and atraumatic.  Right Ear: Hearing, tympanic membrane, external ear and ear canal normal. No tenderness. No foreign bodies. Tympanic membrane is not retracted and not bulging.  Left Ear: Hearing, tympanic membrane, external ear and ear canal normal. No tenderness. No foreign bodies. Tympanic membrane is not retracted and not bulging.  Nose: Nose normal. No mucosal edema or rhinorrhea. Right sinus exhibits no maxillary sinus tenderness and no frontal sinus tenderness. Left sinus exhibits no maxillary sinus tenderness and no frontal sinus tenderness.  Mouth/Throat: Uvula is midline, oropharynx is clear and moist and mucous  membranes are normal. Normal dentition. No dental caries. No oropharyngeal exudate or tonsillar abscesses.  Eyes: Conjunctivae, EOM and lids are normal. Pupils are equal, round, and reactive to light. No foreign bodies found.  Neck: Trachea normal, normal range of motion and phonation normal. Neck supple. Carotid bruit is not present. No mass and no thyromegaly present.  Cardiovascular: Normal rate, regular rhythm, S1 normal, S2 normal, normal heart sounds, intact distal pulses and normal pulses.  Exam reveals no gallop.   No murmur heard. Pulmonary/Chest: Effort normal and breath sounds normal. No respiratory distress. He has no wheezes. He has no rhonchi. He has no rales.  Abdominal: Soft. Normal appearance and bowel sounds are normal. There is no hepatosplenomegaly. There is no tenderness. There is no rebound, no guarding and no CVA tenderness. No hernia.  Neurological: He is alert. He has normal reflexes.  Skin: Skin is warm, dry and intact. No rash noted.  Psychiatric: He has a normal mood and affect. His speech is normal and behavior is normal. Judgment normal.          Assessment & Plan:

## 2010-12-09 NOTE — Assessment & Plan Note (Signed)
Will treat with antibiotics given present symptoms greater than 10 days. NO cough med indicated. Start mucinex DM.

## 2010-12-09 NOTE — Assessment & Plan Note (Signed)
Bp elevated due to not taking med in 2 days... Pt encouraged to take med daily.

## 2010-12-09 NOTE — Patient Instructions (Addendum)
Make sure taking medication everyday to control blood pressure.  Follow BP at home.. Goal <140/90. Start mucinex DM to break up mucus and treat cough. Start antibiotic for likely bacterial bronchitis.  Call if not  Improving as expected.

## 2010-12-27 ENCOUNTER — Ambulatory Visit (INDEPENDENT_AMBULATORY_CARE_PROVIDER_SITE_OTHER): Payer: BC Managed Care – PPO | Admitting: Family Medicine

## 2010-12-27 ENCOUNTER — Encounter: Payer: Self-pay | Admitting: Family Medicine

## 2010-12-27 DIAGNOSIS — J329 Chronic sinusitis, unspecified: Secondary | ICD-10-CM

## 2010-12-27 MED ORDER — BENZONATATE 200 MG PO CAPS: 200.0000 mg | ORAL_CAPSULE | Freq: Three times a day (TID) | ORAL | Status: AC | PRN

## 2010-12-27 MED ORDER — AMOXICILLIN 875 MG PO TABS: 875.0000 mg | ORAL_TABLET | Freq: Two times a day (BID) | ORAL | Status: AC

## 2010-12-27 NOTE — Progress Notes (Signed)
Was seen 12/09/10, rx'd zmax and had diarrhea from that med but he overall improved.  Now with return of sx:  duration of symptoms: 1 week  Rhinorrhea: no Congestion: no ear pain: no sore throat: yes, scratchy Cough: yes, no sputum Myalgias: no other concerns: facial pain Felt hot the last few nights "I just feel bad."    ROS: See HPI.  Otherwise negative.    Meds, vitals, and allergies reviewed.   GEN: nad, alert and oriented HEENT: mucous membranes moist, TM w/o erythema, nasal epithelium injected, OP with cobblestoning, Max sinus ttp NECK: supple w/o LA CV: rrr. PULM: ctab, no inc wob ABD: soft, +bs EXT: no edema

## 2010-12-27 NOTE — Patient Instructions (Signed)
Drink plenty of fluids, take tylenol as needed, and gargle with warm salt water for your throat.  Take the amoxil for 10 days.  Take the tessalon for cough.  This should gradually improve.  Take care.  Let us know if you have other concerns.

## 2010-12-29 ENCOUNTER — Encounter: Payer: Self-pay | Admitting: Family Medicine

## 2010-12-29 DIAGNOSIS — J329 Chronic sinusitis, unspecified: Secondary | ICD-10-CM | POA: Insufficient documentation

## 2010-12-29 NOTE — Assessment & Plan Note (Signed)
Nontoxic, but given the exam, I would treat.  Use Amoxil and tessalon, rest and fluids, f/u prn.  He agrees.

## 2011-01-08 ENCOUNTER — Other Ambulatory Visit: Payer: Self-pay | Admitting: Internal Medicine

## 2011-01-08 DIAGNOSIS — I10 Essential (primary) hypertension: Secondary | ICD-10-CM

## 2011-01-15 ENCOUNTER — Other Ambulatory Visit (INDEPENDENT_AMBULATORY_CARE_PROVIDER_SITE_OTHER): Payer: BC Managed Care – PPO

## 2011-01-15 DIAGNOSIS — I10 Essential (primary) hypertension: Secondary | ICD-10-CM

## 2011-01-15 LAB — HEPATIC FUNCTION PANEL
AST: 24 U/L (ref 0–37)
Alkaline Phosphatase: 52 U/L (ref 39–117)
Bilirubin, Direct: 0.1 mg/dL (ref 0.0–0.3)

## 2011-01-15 LAB — CBC WITH DIFFERENTIAL/PLATELET
Basophils Absolute: 0 10*3/uL (ref 0.0–0.1)
Eosinophils Absolute: 0.2 10*3/uL (ref 0.0–0.7)
Hemoglobin: 15.2 g/dL (ref 13.0–17.0)
Lymphocytes Relative: 17 % (ref 12.0–46.0)
MCHC: 33.8 g/dL (ref 30.0–36.0)
Monocytes Relative: 9 % (ref 3.0–12.0)
Neutrophils Relative %: 71 % (ref 43.0–77.0)
RBC: 4.78 Mil/uL (ref 4.22–5.81)
RDW: 12.9 % (ref 11.5–14.6)

## 2011-01-15 LAB — BASIC METABOLIC PANEL
CO2: 28 mEq/L (ref 19–32)
Calcium: 8.7 mg/dL (ref 8.4–10.5)
Creatinine, Ser: 1.3 mg/dL (ref 0.4–1.5)
GFR: 57.82 mL/min — ABNORMAL LOW (ref >60.00)
Glucose, Bld: 105 mg/dL — ABNORMAL HIGH (ref 70–99)
Sodium: 142 mEq/L (ref 135–145)

## 2011-01-15 LAB — TSH: TSH: 4.67 u[IU]/mL (ref 0.35–5.50)

## 2011-01-22 ENCOUNTER — Encounter: Payer: Self-pay | Admitting: Internal Medicine

## 2011-01-22 ENCOUNTER — Ambulatory Visit (INDEPENDENT_AMBULATORY_CARE_PROVIDER_SITE_OTHER): Payer: BC Managed Care – PPO | Admitting: Internal Medicine

## 2011-01-22 VITALS — BP 112/78 | HR 56 | Temp 97.7°F | Ht 64.0 in | Wt 189.0 lb

## 2011-01-22 DIAGNOSIS — Z0000 Encounter for general adult medical examination without abnormal findings: Secondary | ICD-10-CM | POA: Insufficient documentation

## 2011-01-22 DIAGNOSIS — I10 Essential (primary) hypertension: Secondary | ICD-10-CM

## 2011-01-22 DIAGNOSIS — N400 Enlarged prostate without lower urinary tract symptoms: Secondary | ICD-10-CM

## 2011-01-22 DIAGNOSIS — Z23 Encounter for immunization: Secondary | ICD-10-CM

## 2011-01-22 DIAGNOSIS — L570 Actinic keratosis: Secondary | ICD-10-CM | POA: Insufficient documentation

## 2011-01-22 NOTE — Assessment & Plan Note (Signed)
BP Readings from Last 3 Encounters:  01/22/11 112/78  12/27/10 126/80  12/09/10 150/90   Good control back on meds Labs fine Lab Results  Component Value Date   CREATININE 1.3 01/15/2011

## 2011-01-22 NOTE — Assessment & Plan Note (Signed)
Doing well UTD on colon No PSA after discussion Discussed fitness---glucose was 105---will need to be watched zostavax next year---recent zoster attack

## 2011-01-22 NOTE — Assessment & Plan Note (Signed)
Liquid nitrogen 45 seconds x 2 Tolerated well Discussed home care 

## 2011-01-22 NOTE — Progress Notes (Signed)
Subjective:    Patient ID: Bobby Woods, male    DOB: 05-07-1945, 66 y.o.   MRN: [id number]  HPI Doing okay Wants a couple of moles checked No other new problems otherwise 28 year old father has had recurrence of prostate cancer from years ago---now on hospice  Discussed PSA testing He had negative biopsy and is comfortable without checking again  Respiratory symptoms are some better Still some cough and occ yellow mucus No SOB or fever  Current Outpatient Prescriptions on File Prior to Visit  Medication Sig Dispense Refill  . amLODipine (NORVASC) 5 MG tablet Take 1 tablet (5 mg total) by mouth daily.  90 tablet  3  . aspirin 81 MG tablet Take 81 mg by mouth daily.        Marland Kitchen atenolol (TENORMIN) 25 MG tablet Take 1/2 tablet by mouth once daily  45 tablet  3  . finasteride (PROSCAR) 5 MG tablet Take 1 tablet (5 mg total) by mouth daily.  30 tablet  11  . omeprazole (PRILOSEC OTC) 20 MG tablet Take 1 tablet (20 mg total) by mouth daily.  90 tablet  1    Allergies  Allergen Reactions  . Zithromax (Azithromycin Dihydrate)     GI upset    Past Medical History  Diagnosis Date  . Hypertension   . Arthritis   . BPH (benign prostatic hypertrophy)   . GERD (gastroesophageal reflux disease)     Past Surgical History  Procedure Date  . Inguinal hernia repair 1965  . Tonsillectomy 1972  . Retinal detachment surgery 1985    left, right 05/07  . Rotator cuff repair 10/09    right  . Prostate biopsy 2011    negative    Family History  Problem Relation Age of Onset  . Cancer Father     prostate  . Cancer Paternal Uncle     prostate cancer    History   Social History  . Marital Status: Married    Spouse Name: N/A    Number of Children: 3  . Years of Education: N/A   Occupational History  . Art therapist    Social History Main Topics  . Smoking status: Never Smoker   . Smokeless tobacco: Never Used  . Alcohol Use: No  . Drug Use: No  . Sexually Active:  Not on file   Other Topics Concern  . Not on file   Social History Narrative  . No narrative on file   Review of Systems  Constitutional: Negative for fatigue and unexpected weight change.       Wears seat belt  HENT: Positive for rhinorrhea and tinnitus. Negative for hearing loss, congestion and dental problem.        Keeps up with dentist  Eyes: Negative for visual disturbance.       No unilateral vision loss or diplopia  Respiratory: Positive for cough. Negative for chest tightness and shortness of breath.   Cardiovascular: Positive for palpitations. Negative for chest pain and leg swelling.       Occ feels brief palpitations---not exertional. Last only seconds  Gastrointestinal: Negative for nausea, vomiting, abdominal pain, constipation and blood in stool.       Heartburn quiet on meds   Genitourinary: Negative for dysuria, urgency, frequency and difficulty urinating.       Voids well on the finasteride No sexual problems  Musculoskeletal: Positive for back pain. Negative for joint swelling and arthralgias.  Intermittent back pain--no meds  Skin: Negative for rash.       Moles to check  Neurological: Positive for numbness and headaches. Negative for dizziness, syncope and light-headedness.       Occ numbness in hands---goes away on its own occ mild headache  Hematological: Negative for adenopathy. Does not bruise/bleed easily.  Psychiatric/Behavioral: Negative for sleep disturbance and dysphoric mood. The patient is not nervous/anxious.        Ongoing financial stress       Objective:   Physical Exam  Constitutional: He is oriented to person, place, and time. He appears well-developed and well-nourished. No distress.  HENT:  Head: Normocephalic and atraumatic.  Right Ear: External ear normal.  Left Ear: External ear normal.  Mouth/Throat: Oropharynx is clear and moist. No oropharyngeal exudate.       TMs normal  Eyes: Conjunctivae and EOM are normal. Pupils are  equal, round, and reactive to light.  Neck: Normal range of motion. Neck supple. No thyromegaly present.  Cardiovascular: Normal rate, regular rhythm, normal heart sounds and intact distal pulses.  Exam reveals no gallop.   No murmur heard. Pulmonary/Chest: Effort normal and breath sounds normal. No respiratory distress. He has no wheezes. He has no rales.  Abdominal: Soft. There is no tenderness.  Musculoskeletal: Normal range of motion. He exhibits no edema and no tenderness.  Lymphadenopathy:    He has no cervical adenopathy.  Neurological: He is alert and oriented to person, place, and time.  Skin: No rash noted.       Multiple benign nevi and keratoses 1 actinic ~65mm on right upper chest  Psychiatric: He has a normal mood and affect. His behavior is normal. Judgment and thought content normal.          Assessment & Plan:

## 2011-01-22 NOTE — Assessment & Plan Note (Signed)
Voids well on the med 

## 2011-02-06 ENCOUNTER — Other Ambulatory Visit: Payer: Self-pay | Admitting: *Deleted

## 2011-02-06 MED ORDER — OMEPRAZOLE MAGNESIUM 20 MG PO TBEC: 20.0000 mg | DELAYED_RELEASE_TABLET | Freq: Every day | ORAL | Status: DC

## 2011-05-13 ENCOUNTER — Other Ambulatory Visit: Payer: Self-pay | Admitting: *Deleted

## 2011-05-13 MED ORDER — OMEPRAZOLE MAGNESIUM 20 MG PO TBEC: 20.0000 mg | DELAYED_RELEASE_TABLET | Freq: Every day | ORAL | Status: DC

## 2011-05-13 MED ORDER — ATENOLOL 25 MG PO TABS: ORAL_TABLET | ORAL | Status: DC

## 2011-07-07 ENCOUNTER — Other Ambulatory Visit: Payer: Self-pay | Admitting: *Deleted

## 2011-07-07 MED ORDER — AMLODIPINE BESYLATE 5 MG PO TABS: 5.0000 mg | ORAL_TABLET | Freq: Every day | ORAL | Status: DC

## 2011-07-24 ENCOUNTER — Ambulatory Visit (INDEPENDENT_AMBULATORY_CARE_PROVIDER_SITE_OTHER): Payer: BC Managed Care – PPO | Admitting: Internal Medicine

## 2011-07-24 ENCOUNTER — Encounter: Payer: Self-pay | Admitting: Internal Medicine

## 2011-07-24 VITALS — BP 110/70 | HR 54 | Temp 98.4°F | Ht 64.0 in | Wt 189.0 lb

## 2011-07-24 DIAGNOSIS — I10 Essential (primary) hypertension: Secondary | ICD-10-CM

## 2011-07-24 DIAGNOSIS — N400 Enlarged prostate without lower urinary tract symptoms: Secondary | ICD-10-CM

## 2011-07-24 DIAGNOSIS — M545 Low back pain, unspecified: Secondary | ICD-10-CM

## 2011-07-24 DIAGNOSIS — K219 Gastro-esophageal reflux disease without esophagitis: Secondary | ICD-10-CM

## 2011-07-24 NOTE — Assessment & Plan Note (Signed)
Does fine on the med He will continue

## 2011-07-24 NOTE — Progress Notes (Signed)
Subjective:    Patient ID: Bobby Woods, male    DOB: 12-May-1945, 66 y.o.   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 amLODipine (NORVASC) 5 MG tablet Take 1 tablet (5 mg total) by mouth daily.  90 tablet  3  . aspirin 81 MG tablet Take 81 mg by mouth daily.        Marland Kitchen atenolol (TENORMIN) 25 MG tablet Take 1/2 tablet by mouth once daily  45 tablet  3  . finasteride (PROSCAR) 5 MG tablet Take 1 tablet (5 mg total) by mouth daily.  30 tablet  11  . omeprazole (PRILOSEC OTC) 20 MG tablet Take 1 tablet (20 mg total) by mouth daily.  90 tablet  1    Allergies  Allergen Reactions  . Zithromax (Azithromycin Dihydrate)     GI upset    Past Medical History  Diagnosis Date  . Hypertension   . Arthritis   . BPH (benign prostatic hypertrophy)   . GERD (gastroesophageal reflux disease)     Past Surgical History  Procedure Date  . Inguinal hernia repair 1965  . Tonsillectomy 1972  . Retinal detachment surgery 1985    left, right 05/07  . Rotator cuff repair 10/09    right  . Prostate biopsy 2011    negative    Family History  Problem Relation Age of Onset  . Cancer Father     prostate  . Cancer Paternal Uncle     prostate cancer    History   Social History  . Marital Status: Married    Spouse Name: N/A    Number of Children: 3  . 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 Smoking status: Never Smoker   . Smokeless tobacco: Never Used  . Alcohol Use: No  . Drug Use: No  . Sexually Active: Not on file   Other Topics Concern  . Not on file   Social History Narrative  . No narrative on file   Review of Systems Sleeps well Appetite is good Weight is stable Had bouts of abdominal pain after eating peanuts and corn---better now     Objective:   Physical Exam  Constitutional: He appears well-developed and well-nourished. No distress.  Neck: Normal range of motion. Neck supple. No thyromegaly present.  Cardiovascular: Normal rate, regular rhythm, normal heart sounds and intact distal pulses.  Exam reveals no gallop.   No murmur heard. Pulmonary/Chest: Effort normal and breath sounds normal. No respiratory distress. He has no wheezes. He has no rales.  Abdominal: Soft. There is no tenderness.  Musculoskeletal: He exhibits no edema and no tenderness.  Lymphadenopathy:    He has no cervical adenopathy.  Psychiatric: He has a normal mood and affect. His behavior is normal. Thought  content normal.          Assessment & Plan:

## 2011-07-24 NOTE — Assessment & Plan Note (Signed)
Only occ pain Hasn't needed meds lately

## 2011-07-24 NOTE — Assessment & Plan Note (Signed)
BP Readings from Last 3 Encounters:  07/24/11 110/70  01/22/11 112/78  12/27/10 126/80   Good control No changes needed

## 2011-07-24 NOTE — Assessment & Plan Note (Signed)
Voids okay on the finasteride If worsens, would add tamsulosin

## 2011-07-25 ENCOUNTER — Ambulatory Visit: Payer: BC Managed Care – PPO | Admitting: Internal Medicine

## 2011-08-18 ENCOUNTER — Other Ambulatory Visit: Payer: Self-pay | Admitting: *Deleted

## 2011-08-18 MED ORDER — OMEPRAZOLE MAGNESIUM 20 MG PO TBEC: 20.0000 mg | DELAYED_RELEASE_TABLET | Freq: Every day | ORAL | Status: DC

## 2011-10-28 ENCOUNTER — Other Ambulatory Visit: Payer: Self-pay | Admitting: *Deleted

## 2011-10-28 MED ORDER — FINASTERIDE 5 MG PO TABS: 5.0000 mg | ORAL_TABLET | Freq: Every day | ORAL | Status: DC

## 2011-12-16 ENCOUNTER — Other Ambulatory Visit: Payer: Self-pay | Admitting: *Deleted

## 2011-12-16 MED ORDER — ATENOLOL 25 MG PO TABS: ORAL_TABLET | ORAL | Status: DC

## 2011-12-18 ENCOUNTER — Encounter: Payer: Self-pay | Admitting: Family Medicine

## 2011-12-18 ENCOUNTER — Ambulatory Visit (INDEPENDENT_AMBULATORY_CARE_PROVIDER_SITE_OTHER): Payer: BC Managed Care – PPO | Admitting: Family Medicine

## 2011-12-18 VITALS — BP 122/78 | HR 56 | Temp 98.8°F | Wt 189.2 lb

## 2011-12-18 DIAGNOSIS — J069 Acute upper respiratory infection, unspecified: Secondary | ICD-10-CM

## 2011-12-18 NOTE — Progress Notes (Signed)
  Subjective:    Patient ID: Bobby Woods, male    DOB: 1945/02/20, 66 y.o.   MRN: [id number]  HPI CC: ST  3d h/o sore throat, last night felt feverish.  Throat not getting better.  + R ear pain.  + nasal congestion with some blood.  Has chronic cough, attributes to GERD.  Nonproductive.  So far has tried nothing for this.  No abd pain, HA, tooth pain.  No new rashes.  Son smokes at home, sometimes inside. Daughter sick recently with ear infection, wife recently sick as well with sore throat. No h/o asthma/COPD.  Past Medical History  Diagnosis Date  . Hypertension   . Arthritis   . BPH (benign prostatic hypertrophy)   . GERD (gastroesophageal reflux disease)     Review of Systems Per HPI    Objective:   Physical Exam  Nursing note and vitals reviewed. Constitutional: He appears well-developed and well-nourished. No distress.  HENT:  Head: Normocephalic and atraumatic.  Right Ear: Hearing, tympanic membrane and external ear normal.  Left Ear: Hearing, tympanic membrane, external ear and ear canal normal.  Nose: No mucosal edema or rhinorrhea. Right sinus exhibits no maxillary sinus tenderness and no frontal sinus tenderness. Left sinus exhibits frontal sinus tenderness (mild). Left sinus exhibits no maxillary sinus tenderness.  Mouth/Throat: Uvula is midline and mucous membranes are normal. Posterior oropharyngeal erythema present. No oropharyngeal exudate, posterior oropharyngeal edema or tonsillar abscesses.       Mild erythema inferior ear canal on right Fluid behind bilateral ears.  Eyes: Conjunctivae normal and EOM are normal. Pupils are equal, round, and reactive to light. No scleral icterus.  Neck: Normal range of motion. Neck supple.  Cardiovascular: Normal rate, regular rhythm, normal heart sounds and intact distal pulses.   No murmur heard. Pulmonary/Chest: Effort normal and breath sounds normal. No respiratory distress. He has no wheezes. He has no rales.   Lymphadenopathy:    He has cervical adenopathy (mild R AC LAD).  Skin: Skin is warm and dry. No rash noted.      Assessment & Plan:

## 2011-12-18 NOTE — Patient Instructions (Addendum)
I do think you have a viral upper respiratory infection. Antibiotics are not needed for this.  Viral infections usually take 7-10 days to resolve.  The cough can last several weeks to go away. Use ibuprofen 400-600mg  twice daily for next 2-3 days, with food. Salt water gargles. Suck on cold things like popsicles or warm things like herbal teas (whichever soothes the throat better). Push fluids and plenty of rest. If fever >101, if worsening productive cough, worsening ear pain, or if symptoms are going on past 10 days, let me know. Call clinic with questions.  Good to see you today.

## 2011-12-18 NOTE — Assessment & Plan Note (Signed)
See pt instructions for plan. No evidence of bacterial infection today.  Anticipate all viral. Red flags to notify us discussed.

## 2011-12-22 ENCOUNTER — Telehealth: Payer: Self-pay | Admitting: Internal Medicine

## 2011-12-22 MED ORDER — AMOXICILLIN 500 MG PO TABS: 1000.0000 mg | ORAL_TABLET | Freq: Two times a day (BID) | ORAL | Status: DC

## 2011-12-22 NOTE — Telephone Encounter (Signed)
Geraldine Contras was with another pt; she advised empiric amoxicillin is no different than amoxicillin.Patient notified as instructed by telephone.Medication phoned to [facility]) as instructed.Pt will call back when finishes med with update; sooner if needed.

## 2011-12-22 NOTE — Telephone Encounter (Signed)
Please call him If not doing any better, would try empiric amoxicillin 500mg  2 tabs bid #40 x 0

## 2011-12-22 NOTE — Telephone Encounter (Signed)
Patient Information:  Caller Name: Bobby Woods  Phone: [phone]  Patient: Bobby Woods  Gender: Male  DOB: 1945/05/26  Age: 66 Years  PCP: Eustaquio Boyden [facility])   Symptoms  Reason For Call & Symptoms: Patient was in the office 12/18/11 and seen by Dr. Patrice Paradise and diagnosed with URI.  He states on going  congestion,  right ear discomfort, cough productive yellow. ? Fever last night awoke wet with sweat "fever broke". Sides of neck sore below ear.  He was told to call back if he was no better for antibiotic  Reviewed Health History In EMR: Yes  Reviewed Medications In EMR: Yes  Reviewed Allergies In EMR: Yes  Reviewed Surgeries / Procedures: No  Date of Onset of Symptoms: 12/16/2011  Treatments Tried: Ibuprofen  Treatments Tried Worked: No  Any Fever: Yes  Fever Taken: Tactile  Fever Time Of Reading: 10:01:18  Fever Last Reading: N/A  Guideline(s) Used:  Colds  Disposition Per Guideline:   Home Care  Reason For Disposition Reached:   Colds with no complications  Advice Given:  Reassurance  Colds are very common and may make you feel uncomfortable.  For a Runny Nose With Profuse Discharge:   Nasal mucus and discharge helps to wash viruses and bacteria out of the nose and sinuses.  Blowing the nose is all that is needed.  For a Stuffy Nose - Use Nasal Washes:  Introduction: Saline (salt water) nasal irrigation (nasal wash) is an effective and simple home remedy for treating stuffy nose and sinus congestion. The nose can be irrigated by pouring, spraying, or squirting salt water into the nose and then letting it run back out.  How it Helps: The salt water rinses out excess mucus, washes out any irritants (dust, allergens) that might be present, and moistens the nasal cavity.  Humidifier:  If the air in your home is dry, use a cool-mist humidifier  Treatment for Associated Symptoms of Colds:  For muscle aches, headaches, or moderate fever (more than 101 F or  38.9 C): Take acetaminophen every 4 hours.  Hydrate: Drink adequate liquids.  Contagiousness:  The cold virus is present in your nasal secretions.  Cover your nose and mouth with a tissue when you sneeze or cough.  Wash your hands frequently with soap and water.  Expected Course:   Fever may last 2-3 days  Nasal discharge 7-14 days  Cough up to 2-3 weeks.  Call Back If:  Difficulty breathing occurs  Fever lasts more than 3 days  Nasal discharge lasts more than 10 days  Cough lasts more than 3 weeks  You become worse  Vitamin C:   Vitamin C is probably harmless in standard doses (less than 2 gm daily).  Zinc:   Important Note about Zicam: A zinc nasal gel (i.e., Zicam) is also available over-the-counter. There have been a number of lawsuits claiming that Zicam causes loss of smell (anosmia); it is uncertain whether this truly happens, but for now you should not use this medicine.  Office Follow Up:  Does the office need to follow up with this patient?: Yes  Instructions For The Office: Per Patient, he was  told to call back today if no better for antibiotic.  RN Note:  Patient uses [facility] in [place].  He was told to call back to office today if no better.

## 2011-12-22 NOTE — Telephone Encounter (Signed)
Male answered and stated that patient was not at home and to try again later. I advised I would call tomorrow

## 2012-02-04 ENCOUNTER — Encounter: Payer: Self-pay | Admitting: Internal Medicine

## 2012-02-04 ENCOUNTER — Ambulatory Visit (INDEPENDENT_AMBULATORY_CARE_PROVIDER_SITE_OTHER): Payer: Medicare Other | Admitting: Internal Medicine

## 2012-02-04 VITALS — BP 120/80 | HR 51 | Temp 98.4°F | Ht 65.0 in | Wt 188.0 lb

## 2012-02-04 DIAGNOSIS — Z0000 Encounter for general adult medical examination without abnormal findings: Secondary | ICD-10-CM

## 2012-02-04 DIAGNOSIS — N400 Enlarged prostate without lower urinary tract symptoms: Secondary | ICD-10-CM

## 2012-02-04 DIAGNOSIS — I10 Essential (primary) hypertension: Secondary | ICD-10-CM

## 2012-02-04 DIAGNOSIS — K219 Gastro-esophageal reflux disease without esophagitis: Secondary | ICD-10-CM

## 2012-02-04 LAB — CBC WITH DIFFERENTIAL/PLATELET
Basophils Absolute: 0 10*3/uL (ref 0.0–0.1)
Eosinophils Relative: 2.5 % (ref 0.0–5.0)
HCT: 45.7 % (ref 39.0–52.0)
Hemoglobin: 15.7 g/dL (ref 13.0–17.0)
Lymphs Abs: 1.2 10*3/uL (ref 0.7–4.0)
MCV: 91.5 fl (ref 78.0–100.0)
Monocytes Relative: 8.8 % (ref 3.0–12.0)
Neutro Abs: 4.1 10*3/uL (ref 1.4–7.7)
RDW: 13.1 % (ref 11.5–14.6)

## 2012-02-04 LAB — HEPATIC FUNCTION PANEL
AST: 26 U/L (ref 0–37)
Albumin: 4.1 g/dL (ref 3.5–5.2)
Alkaline Phosphatase: 53 U/L (ref 39–117)
Total Protein: 7 g/dL (ref 6.0–8.3)

## 2012-02-04 LAB — BASIC METABOLIC PANEL
Chloride: 106 mEq/L (ref 96–112)
GFR: 67.57 mL/min (ref >60.00)
Glucose, Bld: 111 mg/dL — ABNORMAL HIGH (ref 70–99)
Potassium: 4.1 mEq/L (ref 3.5–5.1)
Sodium: 140 mEq/L (ref 135–145)

## 2012-02-04 MED ORDER — ATENOLOL 25 MG PO TABS: ORAL_TABLET | ORAL | Status: DC

## 2012-02-04 MED ORDER — FINASTERIDE 5 MG PO TABS: 5.0000 mg | ORAL_TABLET | Freq: Every day | ORAL | Status: DC

## 2012-02-04 MED ORDER — OMEPRAZOLE 20 MG PO CPDR: 20.0000 mg | DELAYED_RELEASE_CAPSULE | Freq: Every day | ORAL | Status: DC

## 2012-02-04 MED ORDER — AMLODIPINE BESYLATE 5 MG PO TABS: 5.0000 mg | ORAL_TABLET | Freq: Every day | ORAL | Status: DC

## 2012-02-04 NOTE — Assessment & Plan Note (Signed)
Voiding fine  No changes needed

## 2012-02-04 NOTE — Assessment & Plan Note (Signed)
Quiet on the med 

## 2012-02-04 NOTE — Assessment & Plan Note (Signed)
Fairly healthy Discussed his overweight and working more on fitness Rx for zostavax

## 2012-02-04 NOTE — Progress Notes (Signed)
Subjective:    Patient ID: Bobby Woods, male    DOB: [birth year], 67 y.o.   MRN: [id number]  HPI Here for physical Reviewed advanced directives Retirement has been very busy---taking wife and daughter (chronic headaches) to multiple doctor visits Has joined [employer] and [facility] 3-4 [facility] per [facility] usually Walks/swims/exercise program  Stomach has been quiet on the [employer] refused the omeprazole---discussed  Voiding okay Nocturia 1-2 and stable No daytime symptoms  Current Outpatient Prescriptions on File Prior to Visit  Medication Sig Dispense Refill  . amLODipine (NORVASC) 5 MG tablet Take 1 tablet (5 mg total) by mouth daily.  90 tablet  3  . aspirin 81 MG tablet Take 81 mg by mouth daily.        Marland Kitchen atenolol (TENORMIN) 25 MG tablet Take 1/2 tablet by mouth once daily  45 tablet  11  . finasteride (PROSCAR) 5 MG tablet Take 1 tablet (5 mg total) by mouth daily.  30 tablet  11  . omeprazole (PRILOSEC OTC) 20 MG tablet Take 1 tablet (20 mg total) by mouth daily.  90 tablet  3    Allergies  Allergen Reactions  . Zithromax (Azithromycin Dihydrate)     GI upset    Past Medical History  Diagnosis Date  . Hypertension   . Arthritis   . BPH (benign prostatic hypertrophy)   . GERD (gastroesophageal reflux disease)     Past Surgical History  Procedure Date  . Inguinal hernia repair 1965  . Tonsillectomy 1972  . Retinal detachment surgery 1985    left, right 05/07  . Rotator cuff repair 10/09    right  . Prostate biopsy 2011    negative    Family History  Problem Relation Age of Onset  . Cancer Father     prostate  . Cancer Paternal Uncle     prostate cancer    History   Social History  . Marital Status: Married    Spouse Name: N/A    Number of Children: 3  . Years of Education: N/A   Occupational History  . 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 Smoking status: Never Smoker   . Smokeless tobacco: Never Used    . Alcohol Use: No  . Drug Use: No  . Sexually Active: Not on file   Other Topics Concern  . Not on file   Social History Narrative   Has living willWife has [name] health care POA and alternate is [name] (cousin)Would accept attempts at resuscitation but no prolonged artificial ventilationWould accept a feeding tube   Review of Systems  Constitutional: Negative for fatigue and unexpected weight change.       Wears seat belt  HENT: Positive for tinnitus. Negative for hearing loss, congestion, rhinorrhea and dental problem.        Mild tinnitus, slight hearing problems Regular with dentist  Eyes: Negative for visual disturbance.       No diplopia or unilateral vision loss  Respiratory: Negative for cough, chest tightness and shortness of breath.   Cardiovascular: Positive for chest pain and palpitations. Negative for leg swelling.       Occ very brief "hurting" in chest--can be sore to touch (he thinks it is costochondritis) Rare skipped beats  Gastrointestinal: Negative for nausea, vomiting, abdominal pain, constipation and blood in stool.       No heartburn on medication  Genitourinary: Negative for urgency, frequency and  difficulty urinating.       No sexual problems  Musculoskeletal: Positive for back pain. Negative for joint swelling and arthralgias.       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       Has lesion on chest he wants checked  Neurological: Negative for dizziness, syncope, weakness, light-headedness, numbness and headaches.  Hematological: Negative for adenopathy. Does not bruise/bleed easily.  Psychiatric/Behavioral: Negative for sleep disturbance and dysphoric mood. The patient is not nervous/anxious.        Dealing okay with stress from wife and daughter's illnesses       Objective:   Physical Exam  Constitutional: He is oriented to person, place, and time. He appears well-developed and well-nourished. No distress.   HENT:  Head: Normocephalic and atraumatic.  Right Ear: External ear normal.  Left Ear: External ear normal.  Mouth/Throat: Oropharynx is clear and moist. No oropharyngeal exudate.  Eyes: Conjunctivae normal and EOM are normal.       Eccentric left pupil  Neck: Normal range of motion. Neck supple. No thyromegaly present.  Cardiovascular: Normal rate, regular rhythm, normal heart sounds and intact distal pulses.  Exam reveals no gallop.   No murmur heard. Pulmonary/Chest: Effort normal and breath sounds normal. No respiratory distress. He has no wheezes. He has no rales.  Abdominal: There is no tenderness.  Musculoskeletal: He exhibits no edema and no tenderness.  Lymphadenopathy:    He has no cervical adenopathy.  Neurological: He is alert and oriented to person, place, and time.  Skin: No rash noted. No erythema.       Benign keratosis on chest  Psychiatric: He has a normal mood and affect. His behavior is normal.          Assessment & Plan:

## 2012-02-04 NOTE — Assessment & Plan Note (Signed)
BP Readings from Last 3 Encounters:  02/04/12 120/80  12/18/11 122/78  07/24/11 110/70   Good control Due for labs

## 2012-02-05 ENCOUNTER — Encounter: Payer: Self-pay | Admitting: *Deleted

## 2012-10-23 ENCOUNTER — Other Ambulatory Visit: Payer: Self-pay | Admitting: Internal Medicine

## 2013-02-08 ENCOUNTER — Ambulatory Visit (INDEPENDENT_AMBULATORY_CARE_PROVIDER_SITE_OTHER): Payer: Medicare Other | Admitting: Internal Medicine

## 2013-02-08 ENCOUNTER — Encounter: Payer: Self-pay | Admitting: Internal Medicine

## 2013-02-08 VITALS — BP 120/80 | HR 56 | Temp 98.1°F | Ht 65.0 in | Wt 186.0 lb

## 2013-02-08 DIAGNOSIS — Z0000 Encounter for general adult medical examination without abnormal findings: Secondary | ICD-10-CM

## 2013-02-08 DIAGNOSIS — I10 Essential (primary) hypertension: Secondary | ICD-10-CM

## 2013-02-08 DIAGNOSIS — K219 Gastro-esophageal reflux disease without esophagitis: Secondary | ICD-10-CM

## 2013-02-08 DIAGNOSIS — N400 Enlarged prostate without lower urinary tract symptoms: Secondary | ICD-10-CM

## 2013-02-08 DIAGNOSIS — D234 Other benign neoplasm of skin of scalp and neck: Secondary | ICD-10-CM

## 2013-02-08 DIAGNOSIS — Z23 Encounter for immunization: Secondary | ICD-10-CM

## 2013-02-08 LAB — CBC WITH DIFFERENTIAL/PLATELET
Basophils Absolute: 0 10*3/uL (ref 0.0–0.1)
Basophils Relative: 0.3 % (ref 0.0–3.0)
EOS ABS: 0.1 10*3/uL (ref 0.0–0.7)
Eosinophils Relative: 1.8 % (ref 0.0–5.0)
HEMATOCRIT: 48.4 % (ref 39.0–52.0)
HEMOGLOBIN: 16.4 g/dL (ref 13.0–17.0)
LYMPHS ABS: 1.5 10*3/uL (ref 0.7–4.0)
Lymphocytes Relative: 21.9 % (ref 12.0–46.0)
MCHC: 33.9 g/dL (ref 30.0–36.0)
MCV: 93 fl (ref 78.0–100.0)
MONO ABS: 0.6 10*3/uL (ref 0.1–1.0)
MONOS PCT: 8.9 % (ref 3.0–12.0)
NEUTROS ABS: 4.6 10*3/uL (ref 1.4–7.7)
Neutrophils Relative %: 67.1 % (ref 43.0–77.0)
PLATELETS: 197 10*3/uL (ref 150.0–400.0)
RBC: 5.21 Mil/uL (ref 4.22–5.81)
RDW: 12.9 % (ref 11.5–14.6)
WBC: 6.8 10*3/uL (ref 4.5–10.5)

## 2013-02-08 LAB — TSH: TSH: 2.73 u[IU]/mL (ref 0.35–5.50)

## 2013-02-08 LAB — COMPREHENSIVE METABOLIC PANEL
ALK PHOS: 51 U/L (ref 39–117)
ALT: 26 U/L (ref 0–53)
AST: 23 U/L (ref 0–37)
Albumin: 4.4 g/dL (ref 3.5–5.2)
BUN: 16 mg/dL (ref 6–23)
CO2: 29 meq/L (ref 19–32)
Calcium: 9.1 mg/dL (ref 8.4–10.5)
Chloride: 105 mEq/L (ref 96–112)
Creatinine, Ser: 1.3 mg/dL (ref 0.4–1.5)
GFR: 57.96 mL/min — AB (ref >60.00)
Glucose, Bld: 101 mg/dL — ABNORMAL HIGH (ref 70–99)
Potassium: 4.1 mEq/L (ref 3.5–5.1)
SODIUM: 140 meq/L (ref 135–145)
TOTAL PROTEIN: 7.5 g/dL (ref 6.0–8.3)
Total Bilirubin: 1.5 mg/dL — ABNORMAL HIGH (ref 0.3–1.2)

## 2013-02-08 LAB — T4, FREE: Free T4: 0.93 ng/dL (ref 0.60–1.60)

## 2013-02-08 NOTE — Assessment & Plan Note (Signed)
Quiet on the PPI Still has some cough though

## 2013-02-08 NOTE — Addendum Note (Signed)
Addended by: Despina Hidden on: 02/08/2013 11:55 AM   Modules accepted: Orders

## 2013-02-08 NOTE — Assessment & Plan Note (Signed)
BP Readings from Last 3 Encounters:  02/08/13 120/80  02/04/12 120/80  12/18/11 122/78   Good control Due for labs No changes

## 2013-02-08 NOTE — Patient Instructions (Signed)
Exercise to Lose Weight Exercise and a healthy diet may help you lose weight. Your doctor may suggest specific exercises. EXERCISE IDEAS AND TIPS  Choose low-cost things you enjoy doing, such as walking, bicycling, or exercising to workout videos.  Take stairs instead of the elevator.  Walk during your lunch break.  Park your car further away from work or school.  Go to a gym or an exercise class.  Start with 5 to 10 minutes of exercise each day. Build up to 30 minutes of exercise 4 to 6 days a week.  Wear shoes with good support and comfortable clothes.  Stretch before and after working out.  Work out until you breathe harder and your heart beats faster.  Drink extra water when you exercise.  Do not do so much that you hurt yourself, feel dizzy, or get very short of breath. Exercises that burn about 150 calories:  Running 1  miles in 15 minutes.  Playing volleyball for 45 to 60 minutes.  Washing and waxing a car for 45 to 60 minutes.  Playing touch football for 45 minutes.  Walking 1  miles in 35 minutes.  Pushing a stroller 1  miles in 30 minutes.  Playing basketball for 30 minutes.  Raking leaves for 30 minutes.  Bicycling 5 miles in 30 minutes.  Walking 2 miles in 30 minutes.  Dancing for 30 minutes.  Shoveling snow for 15 minutes.  Swimming laps for 20 minutes.  Walking up stairs for 15 minutes.  Bicycling 4 miles in 15 minutes.  Gardening for 30 to 45 minutes.  Jumping rope for 15 minutes.  Washing windows or floors for 45 to 60 minutes. Document Released: 02/01/2010 Document Revised: 03/24/2011 Document Reviewed: 02/01/2010 [facility] Patient Information 2014 [place], [place]. DASH Diet The DASH diet stands for "Dietary Approaches to Stop Hypertension." It is a healthy eating plan that has been shown to reduce high blood pressure (hypertension) in as little as 14 days, while also possibly providing other significant health benefits. These other  health benefits include reducing the risk of breast cancer after menopause and reducing the risk of type 2 diabetes, heart disease, colon cancer, and stroke. Health benefits also include weight loss and slowing kidney failure in patients with chronic kidney disease.  DIET GUIDELINES  Limit salt (sodium). Your diet should contain less than 1500 mg of sodium daily.  Limit refined or processed carbohydrates. Your diet should include mostly whole grains. Desserts and added sugars should be used sparingly.  Include small amounts of heart-healthy fats. These types of fats include nuts, oils, and tub margarine. Limit saturated and trans fats. These fats have been shown to be harmful in the body. CHOOSING FOODS  The following food groups are based on a 2000 calorie diet. See your Registered Dietitian for individual calorie needs. Grains and Grain Products (6 to 8 servings daily)  Eat More Often: Whole-wheat bread, brown rice, whole-grain or wheat pasta, quinoa, popcorn without added fat or salt (air popped).  Eat Less Often: White bread, white pasta, white rice, cornbread. Vegetables (4 to 5 servings daily)  Eat More Often: Fresh, frozen, and canned vegetables. Vegetables may be raw, steamed, roasted, or grilled with a minimal amount of fat.  Eat Less Often/Avoid: Creamed or fried vegetables. Vegetables in a cheese sauce. Fruit (4 to 5 servings daily)  Eat More Often: All fresh, canned (in natural juice), or frozen fruits. Dried fruits without added sugar. One hundred percent fruit juice ( cup [237 mL] daily).  Eat Less Often: Dried fruits with added sugar. Canned fruit in light or heavy syrup. [employer], Fish, and Poultry (2 servings or less daily. One serving is 3 to 4 oz [85-114 g]).  Eat More Often: Ninety percent or leaner ground beef, tenderloin, sirloin. Round cuts of beef, chicken breast, [place] breast. All fish. Grill, bake, or broil your meat. Nothing should be fried.  Eat Less  Often/Avoid: Fatty cuts of meat, [place], or chicken leg, thigh, or wing. Fried cuts of meat or fish. Dairy (2 to 3 servings)  Eat More Often: Low-fat or fat-free milk, low-fat plain or light yogurt, reduced-fat or part-skim cheese.  Eat Less Often/Avoid: Milk (whole, 2%).Whole milk yogurt. Full-fat cheeses. Nuts, Seeds, and Legumes (4 to 5 servings per week)  Eat More Often: All without added salt.  Eat Less Often/Avoid: Salted nuts and seeds, canned beans with added salt. Fats and Sweets (limited)  Eat More Often: Vegetable oils, tub margarines without trans fats, sugar-free gelatin. Mayonnaise and salad dressings.  Eat Less Often/Avoid: Coconut oils, palm oils, butter, stick margarine, cream, half and half, cookies, candy, pie. FOR MORE INFORMATION The Dash Diet Eating Plan: www.dashdiet.org Document Released: 12/19/2010 Document Revised: 03/24/2011 Document Reviewed: 12/19/2010 [facility] Patient Information 2014 [place], [place].

## 2013-02-08 NOTE — Progress Notes (Signed)
Pre-visit discussion using our clinic review tool. No additional management support is needed unless otherwise documented below in the visit note.  

## 2013-02-08 NOTE — Progress Notes (Signed)
Subjective:    Patient ID: Bobby Woods, male    DOB: [birth year], 68 y.o.   MRN: [id number]  HPI Here for physical Tries to stay active Still busy with wife and daughter with their health visits [facility]  Voids okay Still on finasteride  Takes the prilosec regularly Controls the heartburn No swallowing problems  Current Outpatient Prescriptions on File Prior to Visit  Medication Sig Dispense Refill  . aspirin 81 MG tablet Take 81 mg by mouth daily.        Marland Kitchen atenolol (TENORMIN) 25 MG tablet Take 1/2 tablet by mouth once daily  45 tablet  3  . finasteride (PROSCAR) 5 MG tablet TAKE 1 TABLET (5 MG TOTAL) BY MOUTH DAILY.  90 tablet  2  . omeprazole (PRILOSEC) 20 MG capsule Take 1 capsule (20 mg total) by mouth daily.  90 capsule  3   No current facility-administered medications on file prior to visit.    Allergies  Allergen Reactions  . Zithromax [Azithromycin Dihydrate]     GI upset    Past Medical History  Diagnosis Date  . Hypertension   . Arthritis   . BPH (benign prostatic hypertrophy)   . GERD (gastroesophageal reflux disease)     Past Surgical History  Procedure Laterality Date  . Inguinal hernia repair  1965  . Tonsillectomy  1972  . Retinal detachment surgery  1985    left, right 05/07  . Rotator cuff repair  10/09    right  . Prostate biopsy  2011    negative    Family History  Problem Relation Age of Onset  . Cancer Father     prostate  . Cancer Paternal Uncle     prostate cancer    History   Social History  . Marital Status: Married    Spouse Name: N/A    Number of Children: 3  . Years of Education: N/A   Occupational History  . 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 Smoking status: Never Smoker   . Smokeless tobacco: Never Used  . Alcohol Use: No  . Drug Use: No  . Sexual Activity: Not on file   Other Topics Concern  . Not on file   Social History Narrative   Has  living will   Wife has [place] health care POA and alternate is [name] (cousin)   Would accept attempts at resuscitation but no prolonged artificial ventilation   Would accept a feeding tube   Review of Systems  Constitutional: Negative for fatigue and unexpected weight change.       Weight down 2# Wears seat belt  HENT: Positive for tinnitus. Negative for dental problem and hearing loss.        Rare tinnitus Regular with dentist  Eyes: Negative for visual disturbance.       No diplopia or unilateral vision loss  Respiratory: Positive for cough. Negative for chest tightness and shortness of breath.        Some cough from GERD  Cardiovascular: Positive for chest pain. Negative for palpitations and leg swelling.       Rare "hurt" in chest -- over sternum. At rest and 1 minute or less. No associated symptoms. No exertional symptoms  Gastrointestinal: Negative for nausea, vomiting, abdominal pain, constipation and blood in stool.  Endocrine: Negative for cold intolerance and heat intolerance.  Genitourinary: Negative for urgency, frequency and difficulty  urinating.       Nocturia x 2 No sex--no problem  Musculoskeletal: Positive for arthralgias and back pain. Negative for joint swelling.       Some pain and stiffness in fingers and hands--no meds. Occasional back pain  Skin: Negative for rash.       Has a few spots he needs checked  Allergic/Immunologic: Negative for environmental allergies and immunocompromised state.  Neurological: Negative for dizziness, syncope, weakness, light-headedness, numbness and headaches.  Hematological: Negative for adenopathy. Does not bruise/bleed easily.  Psychiatric/Behavioral: Positive for sleep disturbance. Negative for dysphoric mood. The patient is not nervous/anxious.        Occasional trouble getting back to sleep after voiding No daytime somnolence       Objective:   Physical Exam  Constitutional: He is oriented to person, place, and  time. He appears well-developed and well-nourished. No distress.  HENT:  Head: Normocephalic and atraumatic.  Right Ear: External ear normal.  Left Ear: External ear normal.  Mouth/Throat: Oropharynx is clear and moist. No oropharyngeal exudate.  Eyes: Conjunctivae and EOM are normal. Pupils are equal, round, and reactive to light.  Neck: Normal range of motion. Neck supple. No thyromegaly present.  Cardiovascular: Normal rate, regular rhythm, normal heart sounds and intact distal pulses.  Exam reveals no gallop.   No murmur heard. Pulmonary/Chest: Effort normal and breath sounds normal. No respiratory distress. He has no wheezes. He has no rales.  Abdominal: Soft. There is no tenderness.  Musculoskeletal: He exhibits no edema and no tenderness.  Lymphadenopathy:    He has no cervical adenopathy.  Neurological: He is alert and oriented to person, place, and time.  Skin: No rash noted. No erythema.  Mildly inflamed dermatofibroma on posterior neck Multiple seb keratoses and benign nevi  Psychiatric: He has a normal mood and affect. His behavior is normal.          Assessment & Plan:

## 2013-02-08 NOTE — Assessment & Plan Note (Signed)
Generally healthy Counseling done Td today No PSA after discussion

## 2013-02-08 NOTE — Assessment & Plan Note (Signed)
Liquid nitrogen 45 seconds x 2 Tolerated well Discussed home care  seb keratosis on chest also treated

## 2013-02-08 NOTE — Assessment & Plan Note (Signed)
Voiding fine on the finasteride

## 2013-02-09 ENCOUNTER — Encounter: Payer: Self-pay | Admitting: *Deleted

## 2013-02-16 ENCOUNTER — Telehealth: Payer: Self-pay | Admitting: Internal Medicine

## 2013-02-16 NOTE — Telephone Encounter (Signed)
Relevant patient education mailed to patient.  

## 2013-03-11 ENCOUNTER — Other Ambulatory Visit: Payer: Self-pay | Admitting: Internal Medicine

## 2013-03-25 ENCOUNTER — Other Ambulatory Visit: Payer: Self-pay | Admitting: *Deleted

## 2013-03-25 MED ORDER — ATENOLOL 25 MG PO TABS: ORAL_TABLET | ORAL | Status: DC

## 2013-05-27 ENCOUNTER — Other Ambulatory Visit: Payer: Self-pay | Admitting: Internal Medicine

## 2013-08-12 ENCOUNTER — Encounter: Payer: Self-pay | Admitting: Internal Medicine

## 2013-09-03 ENCOUNTER — Other Ambulatory Visit: Payer: Self-pay | Admitting: Internal Medicine

## 2014-01-30 ENCOUNTER — Other Ambulatory Visit: Payer: Self-pay | Admitting: *Deleted

## 2014-01-30 MED ORDER — ATENOLOL 25 MG PO TABS: ORAL_TABLET | ORAL | Status: DC

## 2014-01-30 NOTE — Telephone Encounter (Signed)
Rx has been send.

## 2014-02-08 ENCOUNTER — Encounter: Payer: Medicare Other | Admitting: Internal Medicine

## 2014-04-05 ENCOUNTER — Encounter: Payer: Self-pay | Admitting: Internal Medicine

## 2014-04-05 ENCOUNTER — Ambulatory Visit (INDEPENDENT_AMBULATORY_CARE_PROVIDER_SITE_OTHER): Payer: Medicare Other | Admitting: Internal Medicine

## 2014-04-05 VITALS — BP 122/80 | HR 58 | Temp 99.3°F | Wt 180.2 lb

## 2014-04-05 DIAGNOSIS — J069 Acute upper respiratory infection, unspecified: Secondary | ICD-10-CM

## 2014-04-05 NOTE — Progress Notes (Signed)
   Subjective:    Patient ID: Bobby Woods, male    DOB: 28-Jun-1945, 69 y.o.   MRN: [id number]  HPI Here due to respiratory symptoms  Got sick yesterday--came on quick Throat was scratchy--then head congestion and voice change Low grade fever in night-- no sweats or chills though Lots of cough-- mostly dry Clear rhinorrhea No SOB No ear pain  No meds for this ---other than cough drops  Current Outpatient Prescriptions on File Prior to Visit  Medication Sig Dispense Refill  . amLODipine (NORVASC) 5 MG tablet TAKE 1 TABLET (5 MG TOTAL) BY MOUTH DAILY. 90 tablet 3  . aspirin 81 MG tablet Take 81 mg by mouth daily.      Marland Kitchen atenolol (TENORMIN) 25 MG tablet Take 1/2 tablet by mouth once daily 45 tablet 0  . finasteride (PROSCAR) 5 MG tablet TAKE 1 TABLET (5 MG TOTAL) BY MOUTH DAILY. 90 tablet 2  . omeprazole (PRILOSEC) 20 MG capsule TAKE 1 CAPSULE (20 MG TOTAL) BY MOUTH DAILY. 90 capsule 3   No current facility-administered medications on file prior to visit.    Allergies  Allergen Reactions  . Zithromax [Azithromycin Dihydrate]     GI upset    Past Medical History  Diagnosis Date  . Hypertension   . Arthritis   . BPH (benign prostatic hypertrophy)   . GERD (gastroesophageal reflux disease)     Past Surgical History  Procedure Laterality Date  . Inguinal hernia repair  1965  . Tonsillectomy  1972  . Retinal detachment surgery  1985    left, right 05/07  . Rotator cuff repair  10/09    right  . Prostate biopsy  2011    negative    Family History  Problem Relation Age of Onset  . Cancer Father     prostate  . Cancer Paternal Uncle     prostate cancer    History   Social History  . Marital Status: Married    Spouse Name: N/A  . Number of Children: 3  . Years of Education: N/A   Occupational History  . 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 Smoking status: Never Smoker   . Smokeless tobacco: Never Used  . Alcohol Use: No    . Drug Use: No  . Sexual Activity: Not on file   Other Topics Concern  . Not on file   Social History Narrative   Has living will   Wife has [place] health care POA and alternate is [name] (cousin)   Would accept attempts at resuscitation but no prolonged artificial ventilation   Would accept a feeding tube   Review of Systems  No rash No vomiting or diarrhea Appetite is okay     Objective:   Physical Exam  Constitutional: No distress.  Slight coarse cough Mild hoarseness  HENT:  Mouth/Throat: Oropharynx is clear and moist. No oropharyngeal exudate.  Mild maxillary and frontal tenderness Moderate nasal inflammation TMs normal  Neck: Normal range of motion. Neck supple. No thyromegaly present.  Pulmonary/Chest: Effort normal and breath sounds normal. No respiratory distress. He has no wheezes. He has no rales.  Lymphadenopathy:    He has no cervical adenopathy.  Skin: No rash noted.          Assessment & Plan:

## 2014-04-05 NOTE — Assessment & Plan Note (Signed)
Discussed apparent viral etiology and supportive care Would try amoxicillin if worsening next week Discussed Rx for his allergies

## 2014-04-05 NOTE — Progress Notes (Signed)
Pre visit review using our clinic review tool, if applicable. No additional management support is needed unless otherwise documented below in the visit note. 

## 2014-04-05 NOTE — Patient Instructions (Signed)
Please try over the counter loratadine 10mg  1-2 tabs daily or cetirizine 10mg  1 tab daily for the allergies. If you are worsening, instead of improving, next week--- call and I will send an order for an antibiotic.

## 2014-04-16 ENCOUNTER — Encounter: Payer: Self-pay | Admitting: Internal Medicine

## 2014-04-25 ENCOUNTER — Ambulatory Visit (INDEPENDENT_AMBULATORY_CARE_PROVIDER_SITE_OTHER): Payer: Medicare Other | Admitting: Internal Medicine

## 2014-04-25 ENCOUNTER — Encounter: Payer: Self-pay | Admitting: Internal Medicine

## 2014-04-25 VITALS — BP 120/80 | HR 60 | Temp 97.7°F | Ht 65.0 in | Wt 184.0 lb

## 2014-04-25 DIAGNOSIS — Z23 Encounter for immunization: Secondary | ICD-10-CM

## 2014-04-25 DIAGNOSIS — I10 Essential (primary) hypertension: Secondary | ICD-10-CM

## 2014-04-25 DIAGNOSIS — L570 Actinic keratosis: Secondary | ICD-10-CM | POA: Diagnosis not present

## 2014-04-25 DIAGNOSIS — K219 Gastro-esophageal reflux disease without esophagitis: Secondary | ICD-10-CM

## 2014-04-25 DIAGNOSIS — Z0000 Encounter for general adult medical examination without abnormal findings: Secondary | ICD-10-CM | POA: Diagnosis not present

## 2014-04-25 DIAGNOSIS — N400 Enlarged prostate without lower urinary tract symptoms: Secondary | ICD-10-CM | POA: Diagnosis not present

## 2014-04-25 DIAGNOSIS — Z7189 Other specified counseling: Secondary | ICD-10-CM | POA: Insufficient documentation

## 2014-04-25 LAB — PSA: PSA: 3.15 ng/mL (ref 0.10–4.00)

## 2014-04-25 LAB — CBC WITH DIFFERENTIAL/PLATELET
BASOS ABS: 0 10*3/uL (ref 0.0–0.1)
BASOS PCT: 0.4 % (ref 0.0–3.0)
EOS ABS: 0.2 10*3/uL (ref 0.0–0.7)
Eosinophils Relative: 2.5 % (ref 0.0–5.0)
HCT: 46.2 % (ref 39.0–52.0)
Hemoglobin: 15.8 g/dL (ref 13.0–17.0)
LYMPHS PCT: 18.1 % (ref 12.0–46.0)
Lymphs Abs: 1.4 10*3/uL (ref 0.7–4.0)
MCHC: 34.3 g/dL (ref 30.0–36.0)
MCV: 91.5 fl (ref 78.0–100.0)
Monocytes Absolute: 0.6 10*3/uL (ref 0.1–1.0)
Monocytes Relative: 8.4 % (ref 3.0–12.0)
NEUTROS PCT: 70.6 % (ref 43.0–77.0)
Neutro Abs: 5.5 10*3/uL (ref 1.4–7.7)
PLATELETS: 219 10*3/uL (ref 150.0–400.0)
RBC: 5.05 Mil/uL (ref 4.22–5.81)
RDW: 13.5 % (ref 11.5–15.5)
WBC: 7.8 10*3/uL (ref 4.0–10.5)

## 2014-04-25 LAB — COMPREHENSIVE METABOLIC PANEL
ALK PHOS: 68 U/L (ref 39–117)
ALT: 19 U/L (ref 0–53)
AST: 19 U/L (ref 0–37)
Albumin: 4.3 g/dL (ref 3.5–5.2)
BILIRUBIN TOTAL: 0.8 mg/dL (ref 0.2–1.2)
BUN: 17 mg/dL (ref 6–23)
CO2: 31 meq/L (ref 19–32)
CREATININE: 1.28 mg/dL (ref 0.40–1.50)
Calcium: 9.6 mg/dL (ref 8.4–10.5)
Chloride: 102 mEq/L (ref 96–112)
GFR: 59.32 mL/min — ABNORMAL LOW (ref >60.00)
GLUCOSE: 106 mg/dL — AB (ref 70–99)
Potassium: 4.5 mEq/L (ref 3.5–5.1)
Sodium: 138 mEq/L (ref 135–145)
Total Protein: 6.9 g/dL (ref 6.0–8.3)

## 2014-04-25 LAB — T4, FREE: Free T4: 0.82 ng/dL (ref 0.60–1.60)

## 2014-04-25 NOTE — Assessment & Plan Note (Signed)
Single lesion on right cheek treated with liquid nitrogen-- 40 seconds x 2

## 2014-04-25 NOTE — Assessment & Plan Note (Signed)
Voids okay on Rx Finasteride will affect his PSA

## 2014-04-25 NOTE — Assessment & Plan Note (Signed)
I have personally reviewed the Medicare Annual Wellness questionnaire and have noted 1. The patient's medical and social history 2. Their use of alcohol, tobacco or illicit drugs 3. Their current medications and supplements 4. The patient's functional ability including ADL's, fall risks, home safety risks and hearing or visual             impairment. 5. Diet and physical activities 6. Evidence for depression or mood disorders  The patients weight, height, BMI and visual acuity have been recorded in the chart I have made referrals, counseling and provided education to the patient based review of the above and I have provided the pt with a written personalized care plan for preventive services.  I have provided you with a copy of your personalized plan for preventive services. Please take the time to review along with your updated medication list.  Setting up colonoscopy for June Will check PSA after discussion prevnar today Needs to get back to more regular exercise

## 2014-04-25 NOTE — Assessment & Plan Note (Signed)
Controlled with PPI Will continue

## 2014-04-25 NOTE — Assessment & Plan Note (Signed)
See social history 

## 2014-04-25 NOTE — Assessment & Plan Note (Signed)
BP Readings from Last 3 Encounters:  04/25/14 120/80  04/05/14 122/80  02/08/13 120/80   Good control No changes needed

## 2014-04-25 NOTE — Progress Notes (Signed)
Subjective:    Patient ID: Bobby Woods, male    DOB: 1945/01/24, 69 y.o.   MRN: [id number]  HPI Here for Medicare wellness and follow up of chronic medical conditions Reviewed advanced directives No hospitalizations or procedures in past 1 year Dentist-- Dr French Ana doctor-- [place]. No other doctors No tobacco products and no alcohol Tries to exercise when he gets to-- Y, etc. Likes to swim. Hearing is okay--mild loss if people speak low volume Vision is okay Independent in instrumental ADLs No falls No depression or anhedonia Reviewed Bobby med list Mild memory lapses--nothing that seems worrisome  Voids okay Continues on the finasteride No recent visit at the urologist Discussed PSA testing  Gets occasional chest pain if he moves Bobby left arm up---soreness over pectoralis muscle No SOB Mild dizziness processing down aisle in church and stands still for a while. Passes quickly No syncope No edema  Stomach is okay No heartburn as long as he takes the omeprazole  Current Outpatient Prescriptions on File Prior to Visit  Medication Sig Dispense Refill  . amLODipine (NORVASC) 5 MG tablet TAKE 1 TABLET (5 MG TOTAL) BY MOUTH DAILY. 90 tablet 3  . aspirin 81 MG tablet Take 81 mg by mouth daily.      Marland Kitchen atenolol (TENORMIN) 25 MG tablet Take 1/2 tablet by mouth once daily 45 tablet 0  . finasteride (PROSCAR) 5 MG tablet TAKE 1 TABLET (5 MG TOTAL) BY MOUTH DAILY. 90 tablet 2  . omeprazole (PRILOSEC) 20 MG capsule TAKE 1 CAPSULE (20 MG TOTAL) BY MOUTH DAILY. 90 capsule 3   No current facility-administered medications on file prior to visit.    Allergies  Allergen Reactions  . Zithromax [Azithromycin Dihydrate]     GI upset    Past Medical History  Diagnosis Date  . Hypertension   . Arthritis   . BPH (benign prostatic hypertrophy)   . GERD (gastroesophageal reflux disease)     Past Surgical History  Procedure Laterality Date  . Inguinal hernia repair   1965  . Tonsillectomy  1972  . Retinal detachment surgery  1985    left, right 05/07  . Rotator cuff repair  10/09    right  . Prostate biopsy  2011    negative    Family History  Problem Relation Age of Onset  . Cancer Father     prostate  . Cancer Paternal Uncle     prostate cancer    History   Social History  . Marital Status: Married    Spouse Name: N/A  . Number of Children: 3  . Years of Education: N/A   Occupational History  . 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 Smoking status: Never Smoker   . Smokeless tobacco: Never Used  . Alcohol Use: No  . Drug Use: No  . Sexual Activity: Not on file   Other Topics Concern  . Not on file   Social History Narrative   Has living will   Wife has [facility] health care POA and alternate is Bobby Woods (cousin)   Would accept attempts at resuscitation but no prolonged artificial ventilation   Not sure about a feeding tube   Review of Systems Just got recall for colonoscopy. He is due in June. Bowels have been fine. Sleeps poorly. Awakens to help wife get up for work. Trouble getting back to sleep.  Occasional daytime somnolence--naps help Appetite  is fine Weight is up a few pounds Finally sold father's house---needed a lot of work till it could be ready Has a few spots he needs checked. Has irritated pimple on chin that gets hit with shaving Teeth okay--regular with dentist Wears seat belt    Objective:   Physical Exam  Constitutional: He is oriented to person, place, and time. He appears well-developed and well-nourished. No distress.  HENT:  Mouth/Throat: Oropharynx is clear and moist. No oropharyngeal exudate.  Neck: Normal range of motion. Neck supple. No thyromegaly present.  Cardiovascular: Normal rate, regular rhythm, normal heart sounds and intact distal pulses.  Exam reveals no gallop.   No murmur heard. Pulmonary/Chest: Effort normal and breath sounds normal. No respiratory  distress. He has no wheezes. He has no rales.  Abdominal: Soft. There is no tenderness.  Musculoskeletal: He exhibits no edema or tenderness.  Lymphadenopathy:    He has no cervical adenopathy.  Neurological: He is alert and oriented to person, place, and time.  President-- "Bobby Woods, Bobby Woods, Bobby Woods--then Bobby Woods [phone] [id number] Recall 2/3  Skin:  Actinic right cheek Small 1-29mm pimple on chin Multiple other benign nevi and keratoses  Psychiatric: He has a normal mood and affect. Bobby behavior is normal.          Assessment & Plan:

## 2014-04-25 NOTE — Addendum Note (Signed)
Addended by: Despina Hidden on: 04/25/2014 12:05 PM   Modules accepted: Orders

## 2014-04-25 NOTE — Progress Notes (Signed)
Pre visit review using our clinic review tool, if applicable. No additional management support is needed unless otherwise documented below in the visit note. 

## 2014-04-26 ENCOUNTER — Encounter: Payer: Self-pay | Admitting: *Deleted

## 2014-05-11 ENCOUNTER — Encounter: Payer: Self-pay | Admitting: Internal Medicine

## 2014-05-23 ENCOUNTER — Other Ambulatory Visit: Payer: Self-pay

## 2014-05-23 MED ORDER — ATENOLOL 25 MG PO TABS: ORAL_TABLET | ORAL | Status: DC

## 2014-05-23 NOTE — Telephone Encounter (Signed)
Pt request refill atenolol to [facility]el; advised done.

## 2014-06-02 ENCOUNTER — Other Ambulatory Visit: Payer: Self-pay | Admitting: Internal Medicine

## 2014-06-26 ENCOUNTER — Ambulatory Visit (AMBULATORY_SURGERY_CENTER): Payer: Self-pay | Admitting: *Deleted

## 2014-06-26 VITALS — Ht 66.0 in | Wt 182.0 lb

## 2014-06-26 DIAGNOSIS — Z1211 Encounter for screening for malignant neoplasm of colon: Secondary | ICD-10-CM

## 2014-06-26 MED ORDER — NA SULFATE-K SULFATE-MG SULF 17.5-3.13-1.6 GM/177ML PO SOLN: 1.0000 | Freq: Once | ORAL | Status: DC

## 2014-06-26 NOTE — Progress Notes (Signed)
No  egg or soy allergy No issues with past sedation No home 02 use No diet pills No e mail,pt states he doesnt use a computer at all

## 2014-07-10 ENCOUNTER — Encounter: Payer: Medicare Other | Admitting: Internal Medicine

## 2014-07-11 ENCOUNTER — Encounter: Payer: Self-pay | Admitting: Internal Medicine

## 2014-07-11 ENCOUNTER — Ambulatory Visit (AMBULATORY_SURGERY_CENTER): Payer: Medicare Other | Admitting: Internal Medicine

## 2014-07-11 VITALS — BP 130/78 | HR 50 | Temp 97.2°F | Resp 18 | Ht 66.0 in | Wt 182.0 lb

## 2014-07-11 DIAGNOSIS — K635 Polyp of colon: Secondary | ICD-10-CM

## 2014-07-11 DIAGNOSIS — Z1211 Encounter for screening for malignant neoplasm of colon: Secondary | ICD-10-CM | POA: Diagnosis not present

## 2014-07-11 DIAGNOSIS — D120 Benign neoplasm of cecum: Secondary | ICD-10-CM

## 2014-07-11 DIAGNOSIS — D123 Benign neoplasm of transverse colon: Secondary | ICD-10-CM | POA: Diagnosis not present

## 2014-07-11 MED ORDER — SODIUM CHLORIDE 0.9 % IV SOLN: 500.0000 mL | INTRAVENOUS | Status: DC

## 2014-07-11 NOTE — Progress Notes (Signed)
Report to PACU, RN, vss, BBS= Clear.  

## 2014-07-11 NOTE — Progress Notes (Signed)
Called to room to assist during endoscopic procedure.  Patient ID and intended procedure confirmed with present staff. Received instructions for my participation in the procedure from the performing physician.  

## 2014-07-11 NOTE — Op Note (Signed)
[place]  [address] [place], [address]   COLONOSCOPY PROCEDURE REPORT  PATIENT: Bobby Woods, Bobby Woods  MR#: [id number] BIRTHDATE: 07/06/1945 , 68  yrs. old GENDER: male ENDOSCOPIST: Eustace Quail, MD REFERRED IR:[id number] Recall, PROCEDURE DATE:  07/11/2014 PROCEDURE:   Colonoscopy, screening and Colonoscopy with snare polypectomy X2 First Screening Colonoscopy - Avg.  risk and is 50 yrs.  old or older - No.  Prior Negative Screening - Now for repeat screening. 10 or more years since last screening  History of Adenoma - Now for follow-up colonoscopy & has been > or = to 3 yrs.  N/A  Polyps removed today? Yes ASA CLASS:   Class II INDICATIONS:Screening for colonic neoplasia and Colorectal Neoplasm Risk Assessment for this procedure is average risk. Previous examinations in 2002 and again in 2006 were negative. MEDICATIONS: Monitored anesthesia care and Propofol 200 mg IV  DESCRIPTION OF PROCEDURE:   After the risks benefits and alternatives of the procedure were thoroughly explained, informed consent was obtained.  The digital rectal exam revealed no abnormalities of the rectum.   The LB [id number]  endoscope was introduced through the anus and advanced to the cecum, which was identified by both the appendix and ileocecal valve. No adverse events experienced.   The quality of the prep was excellent. (MoviPrep was used)  The instrument was then slowly withdrawn as the colon was fully examined. Estimated blood loss is zero unless otherwise noted in this procedure report.     COLON FINDINGS: Two polyps were found in the transverse colon(5 mm) and at the cecum(2 mm).  A polypectomy was performed with a cold snare.  The resection was complete, the polyp tissue was completely retrieved and sent to histology.   The examination was otherwise normal.  Retroflexed views revealed no abnormalities. The time to cecum = 2.2 Withdrawal time = 9.3   The scope was  withdrawn and the procedure completed. COMPLICATIONS: There were no immediate complications.  ENDOSCOPIC IMPRESSION: 1.   Two polyps were found in the transverse colon and at the cecum; polypectomy was performed with a cold snare 2.   The examination was otherwise normal  RECOMMENDATIONS: 1. Repeat colonoscopy in 5 years if polyp adenomatous; otherwise 10 years  eSigned:  Eustace Quail, MD 07/11/2014 11:12 AM   cc: The Patient and Venia Carbon, MD

## 2014-07-11 NOTE — Patient Instructions (Signed)
YOU HAD AN ENDOSCOPIC PROCEDURE TODAY AT [facility]:   Refer to the procedure report that was given to you for any specific questions about what was found during the examination.  If the procedure report does not answer your questions, please call your gastroenterologist to clarify.  If you requested that your care partner not be given the details of your procedure findings, then the procedure report has been included in a sealed envelope for you to review at your convenience later.  YOU SHOULD EXPECT: Some feelings of bloating in the abdomen. Passage of more gas than usual.  Walking can help get rid of the air that was put into your GI tract during the procedure and reduce the bloating. If you had a lower endoscopy (such as a colonoscopy or flexible sigmoidoscopy) you may notice spotting of blood in your stool or on the toilet paper. If you underwent a bowel prep for your procedure, you may not have a normal bowel movement for a few days.  Please Note:  You might notice some irritation and congestion in your nose or some drainage.  This is from the oxygen used during your procedure.  There is no need for concern and it should clear up in a day or so.  SYMPTOMS TO REPORT IMMEDIATELY:   Following lower endoscopy (colonoscopy or flexible sigmoidoscopy):  Excessive amounts of blood in the stool  Significant tenderness or worsening of abdominal pains  Swelling of the abdomen that is new, acute  Fever of 100F or higher  For urgent or emergent issues, a gastroenterologist can be reached at any hour by calling [phone].   DIET: Your first meal following the procedure should be a small meal and then it is ok to progress to your normal diet. Heavy or fried foods are harder to digest and may make you feel nauseous or bloated.  Likewise, meals heavy in dairy and vegetables can increase bloating.  Drink plenty of fluids but you should avoid alcoholic beverages for 24  hours.  ACTIVITY:  You should plan to take it easy for the rest of today and you should NOT DRIVE or use heavy machinery until tomorrow (because of the sedation medicines used during the test).    FOLLOW UP: Our staff will call the number listed on your records the next business day following your procedure to check on you and address any questions or concerns that you may have regarding the information given to you following your procedure. If we do not reach you, we will leave a message.  However, if you are feeling well and you are not experiencing any problems, there is no need to return our call.  We will assume that you have returned to your regular daily activities without incident.  If any biopsies were taken you will be contacted by phone or by letter within the next 1-3 weeks.  Please call us at [phone] if you have not heard about the biopsies in 3 weeks.    SIGNATURES/CONFIDENTIALITY: You and/or your care partner have signed paperwork which will be entered into your electronic medical record.  These signatures attest to the fact that that the information above on your After Visit Summary has been reviewed and is understood.  Full responsibility of the confidentiality of this discharge information lies with you and/or your care-partner.  Polyp handout given Repeat Colonoscopy in 5 years until otherwise noted

## 2014-07-12 ENCOUNTER — Telehealth: Payer: Self-pay | Admitting: *Deleted

## 2014-07-12 NOTE — Telephone Encounter (Signed)
  Follow up Call-  Call back number 07/11/2014  Post procedure Call Back phone  # [phone]  Permission to leave phone message Yes     Patient questions:  Do you have a fever, pain , or abdominal swelling? No. Pain Score  0 *  Have you tolerated food without any problems? Yes.    Have you been able to return to your normal activities? Yes.    Do you have any questions about your discharge instructions: Diet   No. Medications  No. Follow up visit  No.  Do you have questions or concerns about your Care? No.  Actions: * If pain score is 4 or above: No action needed, pain <4.

## 2014-07-18 ENCOUNTER — Encounter: Payer: Self-pay | Admitting: Internal Medicine

## 2014-07-28 ENCOUNTER — Other Ambulatory Visit: Payer: Self-pay | Admitting: Internal Medicine

## 2014-08-29 ENCOUNTER — Other Ambulatory Visit: Payer: Self-pay | Admitting: Internal Medicine

## 2014-08-31 ENCOUNTER — Other Ambulatory Visit: Payer: Self-pay | Admitting: Internal Medicine

## 2014-09-10 ENCOUNTER — Other Ambulatory Visit: Payer: Self-pay | Admitting: Internal Medicine

## 2014-11-10 ENCOUNTER — Ambulatory Visit (INDEPENDENT_AMBULATORY_CARE_PROVIDER_SITE_OTHER): Payer: Medicare Other | Admitting: Family Medicine

## 2014-11-10 ENCOUNTER — Encounter: Payer: Self-pay | Admitting: Family Medicine

## 2014-11-10 ENCOUNTER — Ambulatory Visit: Payer: Medicare Other | Admitting: Family Medicine

## 2014-11-10 VITALS — BP 120/78 | HR 55 | Temp 98.2°F | Ht 65.0 in | Wt 187.0 lb

## 2014-11-10 DIAGNOSIS — M545 Low back pain, unspecified: Secondary | ICD-10-CM | POA: Insufficient documentation

## 2014-11-10 DIAGNOSIS — M722 Plantar fascial fibromatosis: Secondary | ICD-10-CM | POA: Diagnosis not present

## 2014-11-10 DIAGNOSIS — M7710 Lateral epicondylitis, unspecified elbow: Secondary | ICD-10-CM | POA: Insufficient documentation

## 2014-11-10 DIAGNOSIS — M7712 Lateral epicondylitis, left elbow: Secondary | ICD-10-CM

## 2014-11-10 MED ORDER — BACLOFEN 10 MG PO TABS: 5.0000 mg | ORAL_TABLET | Freq: Three times a day (TID) | ORAL | Status: DC | PRN

## 2014-11-10 MED ORDER — ACE TENNIS ELBOW STRAP MISC: 1.0000 | Freq: Every day | Status: DC

## 2014-11-10 NOTE — Patient Instructions (Signed)
Nice to meet you. You likely have plantar fascitis in your right foot. You should ice this area for 15-20 minutes 3 times a day. You should pick up heel cups from a local sporting goods store to have in your shoes.  You likely have tennis elbow in your left elbow. We will send in a prescription for a brace. You should ice this area as above. You also have strained your back. You should use heat on this.  You can take tylenol as needed for discomfort.  If you develop worsening pain, abdominal pain, nausea, vomiting, diarrhea, numbness, weakness, loss of bowel or bladder function, or fevers please seek medical attention.

## 2014-11-10 NOTE — Progress Notes (Signed)
Patient ID: Bobby Woods, male   DOB: February 13, 1945, 69 y.o.   MRN: [id number]  Bobby Rumps, MD Phone: [phone]  Bobby Woods is a 69 y.o. male who presents today for same-day appointment.  Left elbow pain: Patient notes the area of the lateral epicondyles has been tender and painful for several months. He notes this waxes and wanes. It is intermittent. He notes some radiation down his forearm. Occasionally his fingers hurt as well with this. He has no numbness or weakness in the left arm or hand. He had no injury to the area. He did repetitive motion at work in the past. He has not tried anything for this.  Right heel pain: Patient notes for several months the mid and anterior portion of his right heel has been sore. This occurs in the morning and is worse with this first step out of bed. He gets progressively better throughout the day. He had no injury to this area. He has no numbness or weakness. He's never had this before.  Left low back pain: Patient notes over the weekend he was moving a large pile of branches at his son's house from the backyard out to the street so that the city could pick them up. He notes he started to feel sore in his left low back with this and it progressively got worse throughout the day. He notes now it hurts with rotation of his back. He has no abdominal pain with this. He has no shortness of breath with this. He has no radiation down his legs. He has no dysuria, urinary frequency, or urgency with this. He has had no blood in his urine. He has been having normal bowel movements with this. He denies bowel and bladder incontinence, saddle anesthesia, fevers, and history of cancer. He notes this bothers him mostly at night when he rolls over in bed.  PMH: nonsmoker.   ROS see history of present illness  Objective  Physical Exam Filed Vitals:   11/10/14 1413  BP: 120/78  Pulse: 55  Temp: 98.2 F (36.8 C)   Physical Exam  Constitutional: He is  well-developed, well-nourished, and in no distress.  HENT:  Head: Normocephalic and atraumatic.  Right Ear: External ear normal.  Left Ear: External ear normal.  Mouth/Throat: Oropharynx is clear and moist. No oropharyngeal exudate.  Eyes: Conjunctivae are normal. Pupils are equal, round, and reactive to light.  Neck: Neck supple.  Cardiovascular: Normal rate, regular rhythm and normal heart sounds.  Exam reveals no gallop and no friction rub.   No murmur heard. 2+ DP and radial pulses, hands and feet are warm and well perfused  Pulmonary/Chest: Effort normal and breath sounds normal. No respiratory distress. He has no wheezes. He has no rales.  Abdominal: Soft. Bowel sounds are normal. He exhibits no distension. There is no tenderness. There is no rebound.  Musculoskeletal: He exhibits no edema.  No midline back tenderness or step-off, there is left low back muscular tenderness to palpation, there is no erythema or swelling in this area, exam of left elbow reveals tenderness over the lateral epicondyle and pain with resisted pronation, there is no tenderness of the rest of his forearm or elbow there is no swelling or erythema, he has full range of motion of his elbow, exam of the right heel reveals tenderness at the insertion of the plantar fascia, there is no swelling or erythema, hands and feet are warm and well perfused  Lymphadenopathy:    He has  no cervical adenopathy.  Neurological:  5/5 strength in bilateral biceps, triceps, grip, quads, hamstrings, plantar and dorsiflexion, sensation to light touch intact in bilateral UE and LE, normal gait, 2+ patellar reflexes  Skin: He is not diaphoretic.     Assessment/Plan: Please see individual problem list.  Tennis elbow Symptoms in left elbow consistent with lateral epicondylitis. He is neurovascularly intact. He will ice this area and wear tennis elbow brace. He can take Tylenol for discomfort. Since ibuprofen is not a great option given  his history of reflux and his decreased GFR. He is given return precautions.  Plantar fasciitis of right foot Right heel discomfort likely related to the plantar fasciitis based on location and reproduction of pain and history. he is neurovascularly intact. He will treat this with ice and heel cups. Given return precautions.  Left low back pain History and exam are consistent with back strain. His vitals are stable, he has no abdominal pain, and he has intact peripheral pulses making a vascular cause of this discomfort unlikely. He is neuro vascularly intact. He has no red flags. Treat his discomfort with Tylenol and baclofen. He will apply heat to the area. He is given return precautions.    Meds ordered this encounter  Medications  . baclofen (LIORESAL) 10 MG tablet    Sig: Take 0.5 tablets (5 mg total) by mouth 3 (three) times daily as needed for muscle spasms.    Dispense:  20 each    Refill:  0  . Elastic Bandages & Supports (ACE TENNIS ELBOW STRAP) MISC    Sig: 1 Device by Does not apply route daily.    Dispense:  1 each    Refill:  0    Bobby Woods

## 2014-11-10 NOTE — Progress Notes (Signed)
Pre visit review using our clinic review tool, if applicable. No additional management support is needed unless otherwise documented below in the visit note. 

## 2014-11-10 NOTE — Assessment & Plan Note (Addendum)
Right heel discomfort likely related to the plantar fasciitis based on location and reproduction of pain and history. he is neurovascularly intact. He will treat this with ice and heel cups. Given return precautions.

## 2014-11-10 NOTE — Assessment & Plan Note (Signed)
History and exam are consistent with back strain. His vitals are stable, he has no abdominal pain, and he has intact peripheral pulses making a vascular cause of this discomfort unlikely. He is neuro vascularly intact. He has no red flags. Treat his discomfort with Tylenol and baclofen. He will apply heat to the area. He is given return precautions.

## 2014-11-10 NOTE — Assessment & Plan Note (Signed)
Symptoms in left elbow consistent with lateral epicondylitis. He is neurovascularly intact. He will ice this area and wear tennis elbow brace. He can take Tylenol for discomfort. Since ibuprofen is not a great option given his history of reflux and his decreased GFR. He is given return precautions.

## 2014-11-13 ENCOUNTER — Other Ambulatory Visit: Payer: Self-pay

## 2014-11-13 NOTE — Telephone Encounter (Signed)
Please advise refill? 

## 2014-11-15 NOTE — Telephone Encounter (Signed)
I sent this in for the patient on 11/10/14, he should not have needed a refill that quickly. Please see if he ever got this filled. Thanks.

## 2014-11-17 MED ORDER — BACLOFEN 10 MG PO TABS: 5.0000 mg | ORAL_TABLET | Freq: Three times a day (TID) | ORAL | Status: DC | PRN

## 2014-11-17 NOTE — Telephone Encounter (Signed)
Approved: #[phone] x 0 If he has ongoing problems, he should schedule a follow up

## 2015-01-18 ENCOUNTER — Ambulatory Visit (INDEPENDENT_AMBULATORY_CARE_PROVIDER_SITE_OTHER)
Admission: RE | Admit: 2015-01-18 | Discharge: 2015-01-18 | Disposition: A | Discharge status: Home / Self Care | Payer: Medicare Other | Source: Ambulatory Visit | Attending: Primary Care | Admitting: Primary Care

## 2015-01-18 ENCOUNTER — Ambulatory Visit (INDEPENDENT_AMBULATORY_CARE_PROVIDER_SITE_OTHER): Payer: Medicare Other | Admitting: Primary Care

## 2015-01-18 ENCOUNTER — Encounter: Payer: Self-pay | Admitting: Primary Care

## 2015-01-18 VITALS — BP 120/86 | HR 55 | Temp 98.4°F | Ht 65.0 in | Wt 187.0 lb

## 2015-01-18 DIAGNOSIS — R109 Unspecified abdominal pain: Secondary | ICD-10-CM | POA: Diagnosis not present

## 2015-01-18 DIAGNOSIS — M79671 Pain in right foot: Secondary | ICD-10-CM | POA: Diagnosis not present

## 2015-01-18 DIAGNOSIS — M7751 Other enthesopathy of right foot: Secondary | ICD-10-CM | POA: Insufficient documentation

## 2015-01-18 DIAGNOSIS — M779 Enthesopathy, unspecified: Secondary | ICD-10-CM | POA: Diagnosis not present

## 2015-01-18 LAB — COMPREHENSIVE METABOLIC PANEL
ALT: 21 U/L (ref 0–53)
AST: 20 U/L (ref 0–37)
Albumin: 4.4 g/dL (ref 3.5–5.2)
Alkaline Phosphatase: 54 U/L (ref 39–117)
BUN: 17 mg/dL (ref 6–23)
CALCIUM: 9.3 mg/dL (ref 8.4–10.5)
CHLORIDE: 104 meq/L (ref 96–112)
CO2: 30 meq/L (ref 19–32)
Creatinine, Ser: 1.23 mg/dL (ref 0.40–1.50)
GFR: 61.98 mL/min (ref >60.00)
Glucose, Bld: 90 mg/dL (ref 70–99)
POTASSIUM: 4.1 meq/L (ref 3.5–5.1)
Sodium: 140 mEq/L (ref 135–145)
Total Bilirubin: 0.8 mg/dL (ref 0.2–1.2)
Total Protein: 6.8 g/dL (ref 6.0–8.3)

## 2015-01-18 LAB — CBC WITH DIFFERENTIAL/PLATELET
BASOS PCT: 0.3 % (ref 0.0–3.0)
Basophils Absolute: 0 10*3/uL (ref 0.0–0.1)
Eosinophils Absolute: 0.1 10*3/uL (ref 0.0–0.7)
Eosinophils Relative: 1.5 % (ref 0.0–5.0)
HEMATOCRIT: 46 % (ref 39.0–52.0)
Hemoglobin: 15.5 g/dL (ref 13.0–17.0)
LYMPHS ABS: 1.7 10*3/uL (ref 0.7–4.0)
LYMPHS PCT: 20.4 % (ref 12.0–46.0)
MCHC: 33.7 g/dL (ref 30.0–36.0)
MCV: 92.8 fl (ref 78.0–100.0)
MONOS PCT: 8.3 % (ref 3.0–12.0)
Monocytes Absolute: 0.7 10*3/uL (ref 0.1–1.0)
NEUTROS ABS: 5.6 10*3/uL (ref 1.4–7.7)
Neutrophils Relative %: 69.5 % (ref 43.0–77.0)
PLATELETS: 204 10*3/uL (ref 150.0–400.0)
RBC: 4.96 Mil/uL (ref 4.22–5.81)
RDW: 13.2 % (ref 11.5–15.5)
WBC: 8.1 10*3/uL (ref 4.0–10.5)

## 2015-01-18 LAB — POC URINALSYSI DIPSTICK (AUTOMATED)
BILIRUBIN UA: NEGATIVE
GLUCOSE UA: NEGATIVE
Ketones, UA: NEGATIVE
LEUKOCYTES UA: NEGATIVE
NITRITE UA: NEGATIVE
Protein, UA: NEGATIVE
RBC UA: NEGATIVE
Spec Grav, UA: >=1.03
Urobilinogen, UA: NEGATIVE
pH, UA: 6

## 2015-01-18 LAB — LIPASE: Lipase: 21 U/L (ref 11.0–59.0)

## 2015-01-18 NOTE — Patient Instructions (Signed)
Your urine test did not show any evidence of kidney stones.  Complete lab work prior to leaving today. I will notify you of your results once received.   Complete xray(s) prior to leaving today. I will notify you of your results once received.  Please notify me if you develop nausea, vomiting, diarrhea, abdominal pain.   You may have a bone spur, so be sure to wear comfortable, padded shoes, and do exercises as discussed.  I'll be in touch with you soon.  It was a pleasure meeting you!

## 2015-01-18 NOTE — Assessment & Plan Note (Addendum)
Heel pain x several months, no improvement with heel cups and tylenol. Xray today reveals small bone spur.  Education provided regarding treatment, home exercises provided. Will offer podiatry referral for further evaluation. He is to notify PCP if no improvement.  Suspect posterior knee pain is due to changes in gait due to heel pain. Discussed exercises and good body mechanics.

## 2015-01-18 NOTE — Progress Notes (Signed)
Pre visit review using our clinic review tool, if applicable. No additional management support is needed unless otherwise documented below in the visit note. 

## 2015-01-18 NOTE — Progress Notes (Signed)
Subjective:    Patient ID: Bobby Woods, male    DOB: [birth year], 70 y.o.   MRN: [id number]  HPI  Ms. Bobby Woods is a 70 year old male who presents today with a chief complaint of back and lower extremity pain. His pain is located to the right posterior knee and heel. His back pain has been present for several months and has now moved around to the left lateral side.   Denies numbness/tingling, urinary symptoms, penile discharge, lower extremity swelling, recent trauma or injury, abdominal pain, nausea/vomiting. He was evaluated in October for similar symptoms and suspected to have a muscle strain and plantar fasciitis. He purchased gel heel cups without any improvement.   He's been taking tylenol with some improvement. His heel pain is first noticeable after getting out of bed and progresses throughout the day. He's been walking differently for the past several weeks due to heel pain. His lateral side pain is worse with movement, mostly noticeable when changing positions in bed.  Review of Systems  Constitutional: Negative for fever and chills.  Cardiovascular: Negative for leg swelling.  Gastrointestinal: Negative for abdominal pain.  Genitourinary: Negative for dysuria and flank pain.  Musculoskeletal: Negative for back pain.       Right heel and posterior knee pain  Neurological: Negative for numbness.       Past Medical History  Diagnosis Date  . Arthritis   . BPH (benign prostatic hypertrophy)   . GERD (gastroesophageal reflux disease)   . Hypertension     on medicines   . Blood transfusion without reported diagnosis     age 10 year     Social History   Social History  . Marital Status: Married    Spouse Name: N/A  . Number of Children: 3  . Years of Education: N/A   Occupational History  . 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 Smoking status: Never Smoker   . Smokeless tobacco: Never Used  . Alcohol Use: No  . Drug Use: No  .  Sexual Activity: Not on file   Other Topics Concern  . Not on file   Social History Narrative   Has living will   Wife has [place] health care POA and alternate is Bobby Woods (cousin)   Would accept attempts at resuscitation but no prolonged artificial ventilation   Not sure about a feeding tube    Past Surgical History  Procedure Laterality Date  . Inguinal hernia repair  1965  . Tonsillectomy  1972  . Retinal detachment surgery  1985    left, right 05/07  . Rotator cuff repair  10/09    right  . Prostate biopsy  2011    negative  . Hand surgery    . Colonoscopy  2006    Family History  Problem Relation Age of Onset  . Cancer Father     prostate  . Cancer Paternal Uncle     prostate cancer  . Colon cancer Neg Hx   . Rectal cancer Neg Hx   . Stomach cancer Neg Hx     Allergies  Allergen Reactions  . Zithromax [Azithromycin Dihydrate]     GI upset    Current Outpatient Prescriptions on File Prior to Visit  Medication Sig Dispense Refill  . amLODipine (NORVASC) 5 MG tablet TAKE 1 TABLET (5 MG TOTAL) BY MOUTH DAILY. 90 tablet 3  . aspirin 81 MG tablet Take 81 mg by  mouth daily.      Marland Kitchen atenolol (TENORMIN) 25 MG tablet TAKE 1/2 TABLET BY MOUTH ONCE DAILY. 45 tablet 3  . finasteride (PROSCAR) 5 MG tablet TAKE 1 TABLET (5 MG TOTAL) BY MOUTH DAILY. 90 tablet 3  . omeprazole (PRILOSEC) 20 MG capsule TAKE 1 CAPSULE (20 MG TOTAL) BY MOUTH DAILY. 90 capsule 3  . baclofen (LIORESAL) 10 MG tablet Take 0.5 tablets (5 mg total) by mouth 3 (three) times daily as needed for muscle spasms. (Patient not taking: Reported on 01/18/2015) 20 each 0  . Elastic Bandages & Supports (ACE TENNIS ELBOW STRAP) MISC 1 Device by Does not apply route daily. (Patient not taking: Reported on 01/18/2015) 1 each 0   No current facility-administered medications on file prior to visit.    BP 120/86 mmHg  Pulse 55  Temp(Src) 98.4 F (36.9 C) (Oral)  Ht 5\' 5"  (1.651 m)  Wt 187 lb (84.823 kg)  BMI  31.12 kg/m2  SpO2 97%    Objective:   Physical Exam  Constitutional: He appears well-nourished.  Cardiovascular: Normal rate and regular rhythm.   Pulmonary/Chest: Effort normal and breath sounds normal.  Abdominal: There is generalized tenderness. There is no rebound, no CVA tenderness and negative Murphy's sign.  Musculoskeletal:  Right heel tender upon palpation. No tenderness to left lateral abdomen/flank. Negative straight leg raise.          Assessment & Plan:  Abdominal Tenderness/left lateral side pain:  Incidental finding of abdominal tenderness during exam. No complaints of abdominal pain, however during assessment of left lateral flank patient had generalized tenderness to abdomen. Will obtain CBC and CMP to rule out other causes. Pending. UA today negative, no signs of renal stone. Suspect muscle involvement as his pain is mostly present upon movement in bed. Labs returned and are unremarkable.

## 2015-01-19 ENCOUNTER — Other Ambulatory Visit: Payer: Self-pay | Admitting: Primary Care

## 2015-01-19 DIAGNOSIS — M7730 Calcaneal spur, unspecified foot: Secondary | ICD-10-CM

## 2015-04-27 ENCOUNTER — Encounter: Payer: Medicare Other | Admitting: Internal Medicine

## 2015-05-01 ENCOUNTER — Encounter: Payer: Self-pay | Admitting: Internal Medicine

## 2015-05-01 ENCOUNTER — Ambulatory Visit (INDEPENDENT_AMBULATORY_CARE_PROVIDER_SITE_OTHER): Payer: Medicare Other | Admitting: Internal Medicine

## 2015-05-01 VITALS — BP 132/84 | HR 63 | Temp 98.2°F | Ht 64.0 in | Wt 187.0 lb

## 2015-05-01 DIAGNOSIS — Z0000 Encounter for general adult medical examination without abnormal findings: Secondary | ICD-10-CM

## 2015-05-01 DIAGNOSIS — I10 Essential (primary) hypertension: Secondary | ICD-10-CM

## 2015-05-01 DIAGNOSIS — Z7189 Other specified counseling: Secondary | ICD-10-CM

## 2015-05-01 DIAGNOSIS — K219 Gastro-esophageal reflux disease without esophagitis: Secondary | ICD-10-CM

## 2015-05-01 DIAGNOSIS — N400 Enlarged prostate without lower urinary tract symptoms: Secondary | ICD-10-CM

## 2015-05-01 DIAGNOSIS — M722 Plantar fascial fibromatosis: Secondary | ICD-10-CM

## 2015-05-01 NOTE — Assessment & Plan Note (Signed)
Has not tolerated days off with PPI Continue daily (despite concerns he has heard about)

## 2015-05-01 NOTE — Progress Notes (Signed)
Subjective:    Patient ID: Bobby Woods, male    DOB: 1945/12/25, 70 y.o.   MRN: [id number]  HPI  Here for Medicare wellness and follow up of chronic health conditions Reviewed form and advanced directives Reviewed other doctors No tobacco or alcohol Vision is fine. Mild hearing loss--not a big deal No falls No depression or anhedonia Independent with instrumental ADLs No apparent cognitive problems  Has had flare of plantar fasciitis Better now Did get good walking shoes (?[employer]) Also had epicondylitis--better also  Wants to exercise more Limited by his feet Mostly does yard work Had been swimming regularly last year--but spends much of time taking wife and family for appointments  No chest pain No SOB No dizziness or syncope No edema Occasional headaches--uses tylenol with success at times  Voids okay on med Nocturia stable---usually 2 per night Stream is okay--still not very strong but adequate  Ongoing heartburn if he misses prilosec dose No swallowing problems Does fine if he takes it  Current Outpatient Prescriptions on File Prior to Visit  Medication Sig Dispense Refill  . amLODipine (NORVASC) 5 MG tablet TAKE 1 TABLET (5 MG TOTAL) BY MOUTH DAILY. 90 tablet 3  . aspirin 81 MG tablet Take 81 mg by mouth daily.      Marland Kitchen atenolol (TENORMIN) 25 MG tablet TAKE 1/2 TABLET BY MOUTH ONCE DAILY. 45 tablet 3  . finasteride (PROSCAR) 5 MG tablet TAKE 1 TABLET (5 MG TOTAL) BY MOUTH DAILY. 90 tablet 3  . omeprazole (PRILOSEC) 20 MG capsule TAKE 1 CAPSULE (20 MG TOTAL) BY MOUTH DAILY. 90 capsule 3   No current facility-administered medications on file prior to visit.    Allergies  Allergen Reactions  . Zithromax [Azithromycin Dihydrate]     GI upset    Past Medical History  Diagnosis Date  . Arthritis   . BPH (benign prostatic hypertrophy)   . GERD (gastroesophageal reflux disease)   . Hypertension     on medicines   . Blood transfusion without reported  diagnosis     age 70 year     Past Surgical History  Procedure Laterality Date  . Inguinal hernia repair  1965  . Tonsillectomy  1972  . Retinal detachment surgery  1985    left, right 05/07  . Rotator cuff repair  10/09    right  . Prostate biopsy  2011    negative  . Hand surgery    . Colonoscopy  2006    Family History  Problem Relation Age of Onset  . Cancer Father     prostate  . Cancer Paternal Uncle     prostate cancer  . Colon cancer Neg Hx   . Rectal cancer Neg Hx   . Stomach cancer Neg Hx     Social History   Social History  . Marital Status: Married    Spouse Name: N/A  . Number of Children: 3  . Years of Education: N/A   Occupational History  . 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 Smoking status: Never Smoker   . Smokeless tobacco: Never Used  . Alcohol Use: No  . Drug Use: No  . Sexual Activity: Not on file   Other Topics Concern  . Not on file   Social History Narrative   Has living will   Wife has [place] health care POA and alternate is [name] (cousin)   Would accept attempts at resuscitation  but no prolonged artificial ventilation   Not sure about a feeding tube   Review of Systems Teeth okay--- keeps up with dentist Had colon last year---had small tubular adenoma so 5 year recall Appetite is good Weight is stable--knows he should lose some Some locking up in fingers--especially after driving mower Mild arthritis problems---sporadic in back at times. No meds (rare tylenol for this) Sleeps well Has spot on back of neck he needs checked Bowels are fine    Objective:   Physical Exam  Constitutional: He is oriented to person, place, and time. He appears well-developed and well-nourished. No distress.  HENT:  Mouth/Throat: Oropharynx is clear and moist. No oropharyngeal exudate.  Neck: Normal range of motion. Neck supple. No thyromegaly present.  Cardiovascular: Normal rate, regular rhythm, normal  heart sounds and intact distal pulses.  Exam reveals no gallop.   No murmur heard. Pulmonary/Chest: Effort normal and breath sounds normal. No respiratory distress. He has no wheezes. He has no rales.  Abdominal: Soft. There is no tenderness.  Musculoskeletal: He exhibits no edema or tenderness.  Lymphadenopathy:    He has no cervical adenopathy.  Neurological: He is alert and oriented to person, place, and time.  President --- "Daisy Floro, Obama, Clinton----George Bush" [phone] D-l-r-o-w Recall 2/3  Skin: No rash noted. No erythema.  Small benign nodule posterior neck  Psychiatric: He has a normal mood and affect. His behavior is normal.          Assessment & Plan:

## 2015-05-01 NOTE — Assessment & Plan Note (Signed)
I have personally reviewed the Medicare Annual Wellness questionnaire and have noted 1. The patient's medical and social history 2. Their use of alcohol, tobacco or illicit drugs 3. Their current medications and supplements 4. The patient's functional ability including ADL's, fall risks, home safety risks and hearing or visual             impairment. 5. Diet and physical activities 6. Evidence for depression or mood disorders  The patients weight, height, BMI and visual acuity have been recorded in the chart I have made referrals, counseling and provided education to the patient based review of the above and I have provided the pt with a written personalized care plan for preventive services.  I have provided you with a copy of your personalized plan for preventive services. Please take the time to review along with your updated medication list.  Defer PSA to next year at least Will need colon again in 2021 Up to date on immunizations--yearly flu

## 2015-05-01 NOTE — Assessment & Plan Note (Signed)
See social history 

## 2015-05-01 NOTE — Progress Notes (Signed)
Pre visit review using our clinic review tool, if applicable. No additional management support is needed unless otherwise documented below in the visit note. 

## 2015-05-01 NOTE — Assessment & Plan Note (Signed)
BP Readings from Last 3 Encounters:  05/01/15 132/84  01/18/15 120/86  11/10/14 120/78   Good control Recent labs fine

## 2015-05-01 NOTE — Assessment & Plan Note (Signed)
Voids okay on finasteride No change needed

## 2015-05-01 NOTE — Assessment & Plan Note (Signed)
Better with heel lifts and firmer support shoes

## 2015-06-01 ENCOUNTER — Other Ambulatory Visit: Payer: Self-pay | Admitting: Internal Medicine

## 2015-09-08 ENCOUNTER — Other Ambulatory Visit: Payer: Self-pay | Admitting: Internal Medicine

## 2015-09-27 ENCOUNTER — Other Ambulatory Visit: Payer: Self-pay | Admitting: Internal Medicine

## 2015-11-05 ENCOUNTER — Other Ambulatory Visit: Payer: Self-pay | Admitting: Internal Medicine

## 2015-11-05 ENCOUNTER — Telehealth: Payer: Self-pay

## 2015-11-05 NOTE — Telephone Encounter (Signed)
Please switch him to metoprolol succinate 25mg  daily 1 year Rx Let him know that I am fine with him just staying on this unless he has some problem with it

## 2015-11-05 NOTE — Telephone Encounter (Signed)
Tarheel left v/m; atenolol is on b.o. And request to switch to different med; possibly Metoprolol or whatever Dr Silvio Pate chooses. Tarheel request cb.

## 2015-11-05 NOTE — Telephone Encounter (Signed)
Left message to call office

## 2015-11-06 MED ORDER — METOPROLOL SUCCINATE ER 25 MG PO TB24: 25.0000 mg | ORAL_TABLET | Freq: Every day | ORAL | 3 refills | Status: DC

## 2015-11-06 NOTE — Telephone Encounter (Signed)
Spoke to pt. Atenolol stopped and sent in metoprolol.

## 2016-02-07 ENCOUNTER — Ambulatory Visit (INDEPENDENT_AMBULATORY_CARE_PROVIDER_SITE_OTHER): Payer: Medicare Other | Admitting: Primary Care

## 2016-02-07 ENCOUNTER — Encounter: Payer: Self-pay | Admitting: Primary Care

## 2016-02-07 VITALS — BP 130/84 | HR 57 | Temp 98.2°F | Ht 65.0 in | Wt 182.1 lb

## 2016-02-07 DIAGNOSIS — J069 Acute upper respiratory infection, unspecified: Secondary | ICD-10-CM | POA: Diagnosis not present

## 2016-02-07 MED ORDER — AMOXICILLIN 875 MG PO TABS: 875.0000 mg | ORAL_TABLET | Freq: Two times a day (BID) | ORAL | 0 refills | Status: DC

## 2016-02-07 NOTE — Progress Notes (Signed)
Pre visit review using our clinic review tool, if applicable. No additional management support is needed unless otherwise documented below in the visit note. 

## 2016-02-07 NOTE — Patient Instructions (Signed)
Your symptoms are representative of a viral illness which will resolve on its own over time. Our goal is to treat your symptoms in order to aid your body in the healing process and to make you more comfortable.   Try taking Ibuprofen 400 mg three times daily as needed for inflammation/pain.  Ear Pressure: Try using Flonase (fluticasone) nasal spray. Instill 1 spray in each nostril twice daily.   Continue Dayquil as needed for cough.  Fill the Amoxicillin in 2-3 days if no improvement or if you start to fell worse.  It was a pleasure meeting you!

## 2016-02-07 NOTE — Progress Notes (Signed)
Subjective:    Patient ID: Bobby Woods, male    DOB: 1945-10-21, 71 y.o.   MRN: [id number]  HPI  Bobby Woods is a 71 year old male who presents today with a chief complaint of ear pain. His pain is located to his bilateral ears and has been present for the past 2-3 days. He also reports cough and fatigue that has been present for the past 1 week. He denies fevers, sore throat, sinus pressure, shortness of breath. He's taken Dayquil with some improvement.Overall his cough has improved. He has been exposed to his grandchildren who have recently been ill.  Review of Systems  Constitutional: Positive for fatigue. Negative for chills and fever.  HENT: Positive for ear discharge. Negative for congestion and sinus pressure.   Respiratory: Positive for cough. Negative for shortness of breath and wheezing.   Cardiovascular: Negative for chest pain.       Past Medical History:  Diagnosis Date  . Arthritis   . Blood transfusion without reported diagnosis    age 47 year   . BPH (benign prostatic hypertrophy)   . GERD (gastroesophageal reflux disease)   . Hypertension    on medicines      Social History   Social History  . Marital status: Married    Spouse name: N/A  . Number of children: 3  . Years of education: N/A   Occupational History  . 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 Smoking status: Never Smoker  . Smokeless tobacco: Never Used  . Alcohol use No  . Drug use: No  . Sexual activity: Not on file   Other Topics Concern  . Not on file   Social History Narrative   Has living will   Wife has [place] health care POA and alternate is [name] (cousin)   Would accept attempts at resuscitation but no prolonged artificial ventilation   Not sure about a feeding tube    Past Surgical History:  Procedure Laterality Date  . COLONOSCOPY  2006  . HAND SURGERY    . [place]  . PROSTATE BIOPSY  2011   negative  . [place]   left, right 05/07  . ROTATOR CUFF REPAIR  10/09   right  . TONSILLECTOMY  1972    Family History  Problem Relation Age of Onset  . Cancer Father     prostate  . Cancer Paternal Uncle     prostate cancer  . Colon cancer Neg Hx   . Rectal cancer Neg Hx   . Stomach cancer Neg Hx     Allergies  Allergen Reactions  . Zithromax [Azithromycin Dihydrate]     GI upset    Current Outpatient Prescriptions on File Prior to Visit  Medication Sig Dispense Refill  . amLODipine (NORVASC) 5 MG tablet TAKE 1 TABLET (5 MG TOTAL) BY MOUTH DAILY. 90 tablet 2  . aspirin 81 MG tablet Take 81 mg by mouth daily.      . finasteride (PROSCAR) 5 MG tablet TAKE 1 TABLET (5 MG TOTAL) BY MOUTH DAILY. 90 tablet 1  . metoprolol succinate (TOPROL-XL) 25 MG 24 hr tablet Take 1 tablet (25 mg total) by mouth daily. 90 tablet 3  . omeprazole (PRILOSEC) 20 MG capsule TAKE 1 CAPSULE (20 MG TOTAL) BY MOUTH DAILY. 90 capsule 3   No current facility-administered medications on file prior to visit.  BP 130/84   Pulse (!) 57   Temp 98.2 F (36.8 C) (Oral)   Ht 5\' 5"  (1.651 m)   Wt 182 lb 1.9 oz (82.6 kg)   SpO2 98%   BMI 30.31 kg/m    Objective:   Physical Exam  Constitutional: He appears well-nourished.  HENT:  Right Ear: Tympanic membrane and ear canal normal.  Left Ear: Tympanic membrane and ear canal normal.  Nose: No mucosal edema. Right sinus exhibits no maxillary sinus tenderness and no frontal sinus tenderness. Left sinus exhibits no maxillary sinus tenderness and no frontal sinus tenderness.  Mouth/Throat: Oropharynx is clear and moist. No posterior oropharyngeal erythema.  Dullness to bilateral TM's  Eyes: Conjunctivae are normal.  Neck: Neck supple.  Cardiovascular: Normal rate and regular rhythm.   Pulmonary/Chest: Effort normal and breath sounds normal. He has no wheezes. He has no rales.  Dry cough during exam  Skin: Skin is warm and  dry.          Assessment & Plan:  URI:  Ear pain x 2-3 days, cough with fatigue x 1 week. Cough improved, no fevers.  Exam today with clear lungs, does not appear sickly, dry cough during exam, no ear infection. Suspect viral URI and recommend we treat with conservative measures as he's feeling better and has an unremarkable exam. Recommended ibuprofen, Flonase, Dayquil, rest. Rx printed for Amoxil course for him to fill in 2-3 days if cough doesn't improve/becomes worse. He verbalized understanding.  Sheral Flow, NP

## 2016-04-13 ENCOUNTER — Other Ambulatory Visit: Payer: Self-pay | Admitting: Internal Medicine

## 2016-05-02 ENCOUNTER — Encounter: Payer: Medicare Other | Admitting: Internal Medicine

## 2016-05-23 ENCOUNTER — Other Ambulatory Visit: Payer: Self-pay | Admitting: Internal Medicine

## 2016-07-07 ENCOUNTER — Ambulatory Visit (INDEPENDENT_AMBULATORY_CARE_PROVIDER_SITE_OTHER): Payer: Medicare Other | Admitting: Internal Medicine

## 2016-07-07 ENCOUNTER — Encounter: Payer: Self-pay | Admitting: Internal Medicine

## 2016-07-07 VITALS — BP 122/84 | HR 53 | Temp 98.3°F | Ht 65.0 in | Wt 184.0 lb

## 2016-07-07 DIAGNOSIS — I10 Essential (primary) hypertension: Secondary | ICD-10-CM | POA: Diagnosis not present

## 2016-07-07 DIAGNOSIS — R079 Chest pain, unspecified: Secondary | ICD-10-CM | POA: Diagnosis not present

## 2016-07-07 DIAGNOSIS — Z7189 Other specified counseling: Secondary | ICD-10-CM | POA: Diagnosis not present

## 2016-07-07 DIAGNOSIS — K219 Gastro-esophageal reflux disease without esophagitis: Secondary | ICD-10-CM

## 2016-07-07 DIAGNOSIS — Z0000 Encounter for general adult medical examination without abnormal findings: Secondary | ICD-10-CM

## 2016-07-07 DIAGNOSIS — N401 Enlarged prostate with lower urinary tract symptoms: Secondary | ICD-10-CM | POA: Diagnosis not present

## 2016-07-07 DIAGNOSIS — N138 Other obstructive and reflux uropathy: Secondary | ICD-10-CM

## 2016-07-07 LAB — CBC WITH DIFFERENTIAL/PLATELET
BASOS PCT: 0.8 % (ref 0.0–3.0)
Basophils Absolute: 0 10*3/uL (ref 0.0–0.1)
EOS ABS: 0.2 10*3/uL (ref 0.0–0.7)
EOS PCT: 3.4 % (ref 0.0–5.0)
HEMATOCRIT: 45.7 % (ref 39.0–52.0)
Hemoglobin: 15.8 g/dL (ref 13.0–17.0)
LYMPHS PCT: 17.5 % (ref 12.0–46.0)
Lymphs Abs: 1.1 10*3/uL (ref 0.7–4.0)
MCHC: 34.5 g/dL (ref 30.0–36.0)
MCV: 92.1 fl (ref 78.0–100.0)
MONO ABS: 0.6 10*3/uL (ref 0.1–1.0)
Monocytes Relative: 9.3 % (ref 3.0–12.0)
NEUTROS ABS: 4.4 10*3/uL (ref 1.4–7.7)
Neutrophils Relative %: 69 % (ref 43.0–77.0)
Platelets: 206 10*3/uL (ref 150.0–400.0)
RBC: 4.97 Mil/uL (ref 4.22–5.81)
RDW: 12.8 % (ref 11.5–15.5)
WBC: 6.4 10*3/uL (ref 4.0–10.5)

## 2016-07-07 LAB — COMPREHENSIVE METABOLIC PANEL
ALT: 20 U/L (ref 0–53)
AST: 18 U/L (ref 0–37)
Albumin: 4.4 g/dL (ref 3.5–5.2)
Alkaline Phosphatase: 59 U/L (ref 39–117)
BUN: 15 mg/dL (ref 6–23)
CHLORIDE: 103 meq/L (ref 96–112)
CO2: 31 mEq/L (ref 19–32)
CREATININE: 1.24 mg/dL (ref 0.40–1.50)
Calcium: 9.4 mg/dL (ref 8.4–10.5)
GFR: 61.14 mL/min (ref >60.00)
Glucose, Bld: 100 mg/dL — ABNORMAL HIGH (ref 70–99)
Potassium: 4.4 mEq/L (ref 3.5–5.1)
SODIUM: 138 meq/L (ref 135–145)
Total Bilirubin: 1.1 mg/dL (ref 0.2–1.2)
Total Protein: 6.3 g/dL (ref 6.0–8.3)

## 2016-07-07 LAB — PSA: PSA: 4.08 ng/mL — AB (ref 0.10–4.00)

## 2016-07-07 LAB — T4, FREE: Free T4: 0.69 ng/dL (ref 0.60–1.60)

## 2016-07-07 NOTE — Progress Notes (Signed)
Subjective:    Patient ID: Bobby Woods, male    DOB: 1945/02/25, 71 y.o.   MRN: [id number]  HPI Here for Medicare wellness visit and follow up of chronic health conditions Reviewed form and advanced directives Reviewed other doctors No alcohol or tobacco Tries to exercise regularly--swimming, walking, yard work No falls No depression or anhedonia Vision is fine Some hearing loss--no sig progression Independent with instrumental ADLs No significant memory issues  No new concerns Does need his skin checked---?concerned about lesions (but doesn't know of any)  BP seems to be fine Will get some minor chest pain-- not exertional. Goes away within a couple of minutes No SOB No change in exercise tolerance Not related to meals No edema No palpitations or rare  Voids okay Nocturia x 2 at least--- stable Flow is okay  On PPI daily No heartburn or dysphagia as long as he takes it  Current Outpatient Prescriptions on File Prior to Visit  Medication Sig Dispense Refill  . amLODipine (NORVASC) 5 MG tablet TAKE 1 TABLET (5 MG TOTAL) BY MOUTH DAILY. 90 tablet 2  . aspirin 81 MG tablet Take 81 mg by mouth daily.      . finasteride (PROSCAR) 5 MG tablet TAKE 1 TABLET (5 MG TOTAL) BY MOUTH DAILY. 90 tablet 0  . metoprolol succinate (TOPROL-XL) 25 MG 24 hr tablet Take 1 tablet (25 mg total) by mouth daily. 90 tablet 3  . omeprazole (PRILOSEC) 20 MG capsule TAKE 1 CAPSULE (20 MG TOTAL) BY MOUTH DAILY. 90 capsule 0   No current facility-administered medications on file prior to visit.     Allergies  Allergen Reactions  . Zithromax [Azithromycin Dihydrate]     GI upset    Past Medical History:  Diagnosis Date  . Arthritis   . Blood transfusion without reported diagnosis    age 1 year   . BPH (benign prostatic hypertrophy)   . GERD (gastroesophageal reflux disease)   . Hypertension    on medicines     Past Surgical History:  Procedure Laterality Date  . COLONOSCOPY   2006  . HAND SURGERY    . [place]  . PROSTATE BIOPSY  2011   negative  . [place]   left, right 05/07  . ROTATOR CUFF REPAIR  10/09   right  . TONSILLECTOMY  1972    Family History  Problem Relation Age of Onset  . Cancer Father        prostate  . Cancer Paternal Uncle        prostate cancer  . Colon cancer Neg Hx   . Rectal cancer Neg Hx   . Stomach cancer Neg Hx     Social History   Social History  . Marital status: Married    Spouse name: N/A  . Number of children: 3  . Years of education: N/A   Occupational History  . 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 Smoking status: Never Smoker  . Smokeless tobacco: Never Used  . Alcohol use No  . Drug use: No  . Sexual activity: Not on file   Other Topics Concern  . Not on file   Social History Narrative   Has living will   Wife has [place] health care POA and alternate is [name] (cousin)   Would accept attempts at resuscitation but no prolonged artificial ventilation   Not  sure about a feeding tube   Review of Systems Wife not doing well--"can barely go" Thinking about going back to work--for finances Appetite is okay Weight is stable Sleep is not good at times--some night awakening (things on his mind) Wears seat belt Bowels are fine. No blood No joint pain. Intermittent mild back pain (no meds) No rash  Teeth fine--keeps up with dentist    Objective:   Physical Exam  Constitutional: He is oriented to person, place, and time. He appears well-nourished. No distress.  HENT:  Mouth/Throat: Oropharynx is clear and moist. No oropharyngeal exudate.  Neck: Normal range of motion. Neck supple. No thyromegaly present.  Cardiovascular: Regular rhythm, normal heart sounds and intact distal pulses.  Exam reveals no gallop.   No murmur heard. Mild bradycardia  Pulmonary/Chest: Effort normal and breath sounds normal. No  respiratory distress. He has no wheezes. He has no rales.  Abdominal: Soft. There is no tenderness.  Musculoskeletal: He exhibits no edema or tenderness.  Lymphadenopathy:    He has no cervical adenopathy.  Neurological: He is alert and oriented to person, place, and time.  President--- "Dwaine Deter, Bush" [phone] D-l-r-o-w Recall 3/3  Skin: No rash noted. No erythema.  No actinics  Psychiatric: He has a normal mood and affect. His behavior is normal.          Assessment & Plan:

## 2016-07-07 NOTE — Assessment & Plan Note (Addendum)
Very atypical --not exertional and no changes with activity Not clearly GI either EKG--sinus bradycardia. Repolarization changes seen but no ischemic changes Compared to 2012---same rate, slight T change in L (just about the same)  I doubt this is ischemia Discussed warning symptoms but would hold off on further work up

## 2016-07-07 NOTE — Assessment & Plan Note (Signed)
Quiet on the PPI 

## 2016-07-07 NOTE — Assessment & Plan Note (Signed)
See social history 

## 2016-07-07 NOTE — Assessment & Plan Note (Signed)
BP Readings from Last 3 Encounters:  07/07/16 122/84  02/07/16 130/84  05/01/15 132/84   Good control

## 2016-07-07 NOTE — Assessment & Plan Note (Signed)
Mild stable symptoms No med changes needed

## 2016-07-07 NOTE — Assessment & Plan Note (Signed)
I have personally reviewed the Medicare Annual Wellness questionnaire and have noted 1. The patient's medical and social history 2. Their use of alcohol, tobacco or illicit drugs 3. Their current medications and supplements 4. The patient's functional ability including ADL's, fall risks, home safety risks and hearing or visual             impairment. 5. Diet and physical activities 6. Evidence for depression or mood disorders  The patients weight, height, BMI and visual acuity have been recorded in the chart I have made referrals, counseling and provided education to the patient based review of the above and I have provided the pt with a written personalized care plan for preventive services.  I have provided you with a copy of your personalized plan for preventive services. Please take the time to review along with your updated medication list.  Discussed PSA--will check for last time Colon due 2021 Discussed fitness Yearly flu vaccine

## 2016-07-23 ENCOUNTER — Other Ambulatory Visit: Payer: Self-pay | Admitting: Internal Medicine

## 2016-08-13 ENCOUNTER — Other Ambulatory Visit: Payer: Self-pay | Admitting: Internal Medicine

## 2016-08-21 ENCOUNTER — Other Ambulatory Visit: Payer: Self-pay | Admitting: Internal Medicine

## 2016-10-05 ENCOUNTER — Other Ambulatory Visit: Payer: Self-pay | Admitting: Internal Medicine

## 2016-11-14 ENCOUNTER — Ambulatory Visit: Payer: Self-pay

## 2016-11-14 ENCOUNTER — Encounter: Payer: Self-pay | Admitting: Family Medicine

## 2016-11-14 ENCOUNTER — Ambulatory Visit (INDEPENDENT_AMBULATORY_CARE_PROVIDER_SITE_OTHER): Payer: Medicare Other | Admitting: Family Medicine

## 2016-11-14 VITALS — BP 132/74 | HR 61 | Temp 98.5°F | Ht 65.0 in | Wt 174.0 lb

## 2016-11-14 DIAGNOSIS — J069 Acute upper respiratory infection, unspecified: Secondary | ICD-10-CM | POA: Diagnosis not present

## 2016-11-14 NOTE — Patient Instructions (Signed)
Thank you for coming in,   Please try things such as zyrtec-D or allegra-D which is an antihistamine and decongestant.   Please try afrin which will help with nasal congestion but use for only three days.   Please also try using a netti pot on a regular occasion.  Honey can help with a sore throat.      Please feel free to call with any questions or concerns at any time, at [phone]. --Dr. Schmitz  

## 2016-11-14 NOTE — Telephone Encounter (Signed)
   Reason for Disposition . [1] Continuous (nonstop) coughing interferes with work or school AND [2] no improvement using cough treatment per protocol  Answer Assessment - Initial Assessment Questions 1. ONSET: "When did the cough begin?"      2 days ago 2. SEVERITY: "How bad is the cough today?"      Bad 3. RESPIRATORY DISTRESS: "Describe your breathing."      No problems 4. FEVER: "Do you have a fever?" If so, ask: "What is your temperature, how was it measured, and when did it start?"     No 5. HEMOPTYSIS: "Are you coughing up any blood?" If so ask: "How much?" (flecks, streaks, tablespoons, etc.)     No 6. TREATMENT: "What have you done so far to treat the cough?" (e.g., meds, fluids, humidifier)     OTC Cold and flu 7. CARDIAC HISTORY: "Do you have any history of heart disease?" (e.g., heart attack, congestive heart failure)      No 8. LUNG HISTORY: "Do you have any history of lung disease?"  (e.g., pulmonary embolus, asthma, emphysema)     No 9. PE RISK FACTORS: "Do you have a history of blood clots?" (or: recent major surgery, recent prolonged travel, bedridden )     No 10. OTHER SYMPTOMS: "Do you have any other symptoms? (e.g., runny nose, wheezing, chest pain)       Runny nose 11. PREGNANCY: "Is there any chance you are pregnant?" "When was your last menstrual period?"       No 12. TRAVEL: "Have you traveled out of the country in the last month?" (e.g., travel history, exposures)       No  Protocols used: COUGH - ACUTE NON-PRODUCTIVE-A-AH

## 2016-11-14 NOTE — Progress Notes (Signed)
Bobby Woods - 71 y.o. male MRN [id number]  Date of birth: [birth year]  SUBJECTIVE:  Including CC & ROS.  Chief Complaint  Patient presents with  . Cough    ongoing since 11/11/16. He describes the cough as dry. He does have some sinus pressure.     Bobby Woods is a 71 y.o. male that is is presenting with cough that started about 3 days ago. This cough is nonproductive. He seems to be staying the same. Has not tried any over-the-counter medications. Has had some rhinorrhea and sinus congestion. Has been around other people with sick syndromes. Denies any rash.    Review of Systems  Constitutional: Negative for fever.  HENT: Positive for rhinorrhea and sinus pressure.   Respiratory: Positive for cough.     HISTORY: Past Medical, Surgical, Social, and Family History Reviewed & Updated per EMR.   Pertinent Historical Findings include:  Past Medical History:  Diagnosis Date  . Arthritis   . Blood transfusion without reported diagnosis    age 45 year   . BPH (benign prostatic hypertrophy)   . GERD (gastroesophageal reflux disease)   . Hypertension    on medicines     Past Surgical History:  Procedure Laterality Date  . COLONOSCOPY  2006  . HAND SURGERY    . [place]  . PROSTATE BIOPSY  2011   negative  . [place]   left, right 05/07  . ROTATOR CUFF REPAIR  10/09   right  . TONSILLECTOMY  1972    Allergies  Allergen Reactions  . Zithromax [Azithromycin Dihydrate]     GI upset    Family History  Problem Relation Age of Onset  . Cancer Father        prostate  . Cancer Paternal Uncle        prostate cancer  . Colon cancer Neg Hx   . Rectal cancer Neg Hx   . Stomach cancer Neg Hx      Social History   Social History  . Marital status: Married    Spouse name: N/A  . Number of children: 3  . Years of education: N/A   Occupational History  . 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 Smoking status: Never Smoker  . Smokeless tobacco: Never Used  . Alcohol use No  . Drug use: No  . Sexual activity: Not on file   Other Topics Concern  . Not on file   Social History Narrative   Has living will   Wife has [place] health care POA and alternate is [name] (cousin)   Would accept attempts at resuscitation but no prolonged artificial ventilation   Not sure about a feeding tube     PHYSICAL EXAM:  VS: BP 132/74 (BP Location: Left Arm, Patient Position: Sitting, Cuff Size: Normal)   Pulse 61   Temp 98.5 F (36.9 C) (Oral)   Ht 5\' 5"  (1.651 m)   Wt 174 lb (78.9 kg)   SpO2 99%   BMI 28.96 kg/m  Physical Exam Gen: NAD, alert, cooperative with exam, well-appearing ENT: normal lips, normal nasal mucosa,  Eye: normal EOM, normal conjunctiva and lids CV:  no edema, +2 pedal pulses, S1-S2, regular in rhythm   Resp: no accessory muscle use, non-labored, clear to auscultation bilaterally, no crackles or wheezes Skin: no rashes, no areas of induration  Neuro: normal tone, normal sensation  to touch Psych:  normal insight, alert and oriented MSK:  Normal strength, normal gait      ASSESSMENT & PLAN:   Upper respiratory tract infection Symptoms seem to be consistent with a viral etiology - Counseled on supportive care - Given indications to return - Work note provided

## 2016-11-14 NOTE — Assessment & Plan Note (Signed)
Symptoms seem to be consistent with a viral etiology - Counseled on supportive care - Given indications to return - Work note provided

## 2017-01-14 ENCOUNTER — Other Ambulatory Visit: Payer: Self-pay | Admitting: Internal Medicine

## 2017-06-30 ENCOUNTER — Other Ambulatory Visit: Payer: Self-pay | Admitting: Internal Medicine

## 2017-07-08 ENCOUNTER — Ambulatory Visit (INDEPENDENT_AMBULATORY_CARE_PROVIDER_SITE_OTHER): Payer: Medicare Other | Admitting: Internal Medicine

## 2017-07-08 ENCOUNTER — Encounter: Payer: Self-pay | Admitting: Internal Medicine

## 2017-07-08 VITALS — BP 134/84 | HR 50 | Temp 98.5°F | Ht 64.0 in | Wt 183.0 lb

## 2017-07-08 DIAGNOSIS — R972 Elevated prostate specific antigen [PSA]: Secondary | ICD-10-CM | POA: Diagnosis not present

## 2017-07-08 DIAGNOSIS — Z23 Encounter for immunization: Secondary | ICD-10-CM

## 2017-07-08 DIAGNOSIS — Z0000 Encounter for general adult medical examination without abnormal findings: Secondary | ICD-10-CM

## 2017-07-08 DIAGNOSIS — K219 Gastro-esophageal reflux disease without esophagitis: Secondary | ICD-10-CM | POA: Diagnosis not present

## 2017-07-08 DIAGNOSIS — N138 Other obstructive and reflux uropathy: Secondary | ICD-10-CM | POA: Diagnosis not present

## 2017-07-08 DIAGNOSIS — I10 Essential (primary) hypertension: Secondary | ICD-10-CM | POA: Diagnosis not present

## 2017-07-08 DIAGNOSIS — N401 Enlarged prostate with lower urinary tract symptoms: Secondary | ICD-10-CM

## 2017-07-08 DIAGNOSIS — Z7189 Other specified counseling: Secondary | ICD-10-CM

## 2017-07-08 LAB — COMPREHENSIVE METABOLIC PANEL
ALBUMIN: 4.5 g/dL (ref 3.5–5.2)
ALK PHOS: 54 U/L (ref 39–117)
ALT: 20 U/L (ref 0–53)
AST: 21 U/L (ref 0–37)
BUN: 19 mg/dL (ref 6–23)
CALCIUM: 9.5 mg/dL (ref 8.4–10.5)
CO2: 31 mEq/L (ref 19–32)
Chloride: 104 mEq/L (ref 96–112)
Creatinine, Ser: 1.27 mg/dL (ref 0.40–1.50)
GFR: 59.3 mL/min — ABNORMAL LOW (ref >60.00)
Glucose, Bld: 111 mg/dL — ABNORMAL HIGH (ref 70–99)
POTASSIUM: 4.8 meq/L (ref 3.5–5.1)
SODIUM: 139 meq/L (ref 135–145)
TOTAL PROTEIN: 7.1 g/dL (ref 6.0–8.3)
Total Bilirubin: 0.9 mg/dL (ref 0.2–1.2)

## 2017-07-08 LAB — CBC
HCT: 46.9 % (ref 39.0–52.0)
HEMOGLOBIN: 16.1 g/dL (ref 13.0–17.0)
MCHC: 34.3 g/dL (ref 30.0–36.0)
MCV: 92.2 fl (ref 78.0–100.0)
Platelets: 205 10*3/uL (ref 150.0–400.0)
RBC: 5.09 Mil/uL (ref 4.22–5.81)
RDW: 13.2 % (ref 11.5–15.5)
WBC: 6.8 10*3/uL (ref 4.0–10.5)

## 2017-07-08 NOTE — Assessment & Plan Note (Signed)
Borderline high last year Will recheck ---last time if not much change

## 2017-07-08 NOTE — Progress Notes (Signed)
Subjective:    Patient ID: Bobby Woods, male    DOB: 21-Oct-1945, 72 y.o.   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 amLODipine (NORVASC) 5 MG tablet TAKE 1 TABLET (5 MG TOTAL) BY MOUTH DAILY. 90 tablet 3  . aspirin 81 MG tablet Take 81 mg by mouth daily.      . finasteride (PROSCAR) 5 MG tablet TAKE 1 TABLET BY MOUTH EVERY DAY 90 tablet 0  . metoprolol succinate (TOPROL-XL) 25 MG 24 hr tablet TAKE 1 TABLET BY MOUTH DAILY 90 tablet 3  . omeprazole (PRILOSEC) 20 MG capsule TAKE 1 CAPSULE BY MOUTH EVERY DAY 90 capsule 3   No current facility-administered medications on file prior to visit.     Allergies  Allergen Reactions  . Zithromax [Azithromycin Dihydrate]     GI upset    Past Medical History:  Diagnosis Date  . Arthritis   . Blood transfusion without reported diagnosis    age 81 year   . BPH (benign prostatic hypertrophy)   . GERD (gastroesophageal reflux disease)   . Hypertension    on medicines     Past Surgical History:    Procedure Laterality Date  . COLONOSCOPY  2006  . HAND SURGERY    . [place]  . PROSTATE BIOPSY  2011   negative  . [place]   left, right 05/07  . ROTATOR CUFF REPAIR  10/09   right  . TONSILLECTOMY  1972    Family History  Problem Relation Age of Onset  . Cancer Father        prostate  . Cancer Paternal Uncle        prostate cancer  . Colon cancer Neg Hx   . Rectal cancer Neg Hx   . Stomach cancer Neg Hx     Social History   Socioeconomic History  . Marital status: Married    Spouse name: Not on file  . Number of children: 3  . Years of education: Not on file  . Highest education level: Not on file  Occupational History  . 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 Occupation: Custodial/funeral assistance    Comment: Financial controller  Social Needs  . Financial resource strain: Not on file  . Food insecurity:    Worry: Not on file    Inability: Not on file  .  Transportation needs:    Medical: Not on file    Non-medical: Not on file  Tobacco Use  . Smoking status: Never Smoker  . Smokeless tobacco: Never Used  Substance and Sexual Activity  . Alcohol use: No  . Drug use: No  . Sexual activity: Not on file  Lifestyle  . Physical activity:    Days per week: Not on file    Minutes per session: Not on file  . Stress: Not on file  Relationships  . Social connections:    Talks on phone: Not on file    Gets together: Not on file    Attends religious service: Not on file    Active member of club or organization: Not on file    Attends meetings of clubs or organizations: Not on file    Relationship status: Not on file  . Intimate partner violence:    Fear of current or ex partner: Not on file    Emotionally abused: Not on file    Physically abused: Not on file    Forced sexual activity: Not on file   Other Topics Concern  . Not on file  Social History Narrative   Has living will   Wife has [place] health care POA and alternate is [name] (cousin)   Would accept attempts at resuscitation but no prolonged artificial ventilation   Not sure about a feeding tube   Review of Systems Rare headaches Sleep is not as good lately. Nocturia several times Appetite is fine Weight up and down---he monitors Wears seat belt Teeth okay---keeps up with dentist Has some spots on back to check--no derm Bowels are fine--no blood No sig back or joint pain    Objective:   Physical Exam  Constitutional: He is oriented to person, place, and time. He appears well-developed. No distress.  HENT:  Mouth/Throat: Oropharynx is clear and moist. No oropharyngeal exudate.  Neck: No thyromegaly present.  Cardiovascular: Normal rate, regular rhythm, normal heart sounds and intact distal pulses. Exam reveals no gallop.  No murmur heard. Respiratory: Effort normal and breath sounds normal. No respiratory distress. He has no wheezes. He has no rales.  GI: Soft. There is no tenderness.  Musculoskeletal: He exhibits no edema or tenderness.  Lymphadenopathy:    He has no cervical adenopathy.  Neurological: He is alert and oriented to person, place, and time.  President--- "Dwaine Deter, [place]" [phone] [id number] Recall 3/3  Skin: No rash noted. No erythema.  Fleshy mole on mid right back  Psychiatric: He has a normal mood and affect. His behavior is normal.           Assessment & Plan:

## 2017-07-08 NOTE — Assessment & Plan Note (Signed)
Nocturia the only problem No daytime issues Continue the finasteride

## 2017-07-08 NOTE — Assessment & Plan Note (Addendum)
I have personally reviewed the Medicare Annual Wellness questionnaire and have noted 1. The patient's medical and social history 2. Their use of alcohol, tobacco or illicit drugs 3. Their current medications and supplements 4. The patient's functional ability including ADL's, fall risks, home safety risks and hearing or visual             impairment. 5. Diet and physical activities 6. Evidence for depression or mood disorders  The patients weight, height, BMI and visual acuity have been recorded in the chart I have made referrals, counseling and provided education to the patient based review of the above and I have provided the pt with a written personalized care plan for preventive services.  I have provided you with a copy of your personalized plan for preventive services. Please take the time to review along with your updated medication list.  Yearly flu vaccine Colon due 2021 Consider shingrix Discussed fitness--busy with work now Pneumovax booster today

## 2017-07-08 NOTE — Addendum Note (Signed)
Addended by: Pilar Grammes on: 07/08/2017 10:29 AM   Modules accepted: Orders

## 2017-07-08 NOTE — Assessment & Plan Note (Addendum)
BP Readings from Last 3 Encounters:  07/08/17 134/84  11/14/16 132/74  07/07/16 122/84   Good control Due for labs Can decrease the asa to every other day

## 2017-07-08 NOTE — Assessment & Plan Note (Signed)
Quiet on the PPI 

## 2017-07-08 NOTE — Progress Notes (Signed)
Hearing Screening   Method: Audiometry   125Hz  250Hz  500Hz  1000Hz  2000Hz  3000Hz  4000Hz  6000Hz  8000Hz   Right ear:   40 40 40  0    Left ear:   25 40 40  0    Vision Screening Comments: February 12, 2017

## 2017-07-08 NOTE — Assessment & Plan Note (Signed)
See social history 

## 2017-07-08 NOTE — Patient Instructions (Signed)
Ask your pharmacist about the shingrix vaccine You can continue the aspirin but take it every other day.

## 2017-07-09 ENCOUNTER — Telehealth: Payer: Self-pay | Admitting: *Deleted

## 2017-07-09 LAB — PSA, TOTAL AND FREE
PSA, % Free: 11 % (calc) — ABNORMAL LOW (ref >25)
PSA, FREE: 0.5 ng/mL
PSA, Total: 4.6 ng/mL — ABNORMAL HIGH (ref <=4.0)

## 2017-07-09 NOTE — Telephone Encounter (Signed)
He should try ice for 5-10 minutes at a time & tylenol/ibuprofen prn if still bothering him

## 2017-07-09 NOTE — Telephone Encounter (Signed)
Spoke to pt. He has been putting ice on and Tylenol last night. It is better today. He is not feeling up to par. He will let us know if he has any other major issues.

## 2017-07-09 NOTE — Telephone Encounter (Signed)
Copied from [place] [phone]. Topic: General - Other >> Jul 09, 2017  8:16 AM Scherrie Gerlach wrote: Reason for CRM: Pt states he got a pna vaccine yesterday and was advised to let the dr know if he had any problems. Pt states his arm swelled (gone down now). After that, pt got chills (not any now) and the site was warm.  Still a little warm today. No other issues.

## 2017-07-31 ENCOUNTER — Other Ambulatory Visit: Payer: Self-pay | Admitting: Internal Medicine

## 2017-08-21 ENCOUNTER — Other Ambulatory Visit: Payer: Self-pay | Admitting: Internal Medicine

## 2017-10-23 ENCOUNTER — Ambulatory Visit: Payer: Self-pay

## 2017-10-23 NOTE — Telephone Encounter (Signed)
Patient called in with c/o "scrotum pain." He says "when I had my physical, my PSA was elevated and Dr. Silvio Pate mentioned prostate cancer. I have been having pain since Wednesday to both of my scrotum. The pain is an ache that comes and goes at a 4-5. I just want to see if I can come in to see Dr. Silvio Pate on Tuesday or any provider that's available to check things out to make sure it's not cancer." I asked about swelling, redness, he says "I haven't looked, so I don't know." I asked about other symptoms, he says "I have slight soreness to the right back, some abdominal soreness/discomfort, no problems with urination." According to protocol, see PCP within 3 days, no availability with PCP, appointment scheduled for Tuesday, patient request for early appointment, at 0830 with Dr. Damita Dunnings, care advice given, patient verbalized understanding.  Reason for Disposition . [1] Brief pain in scrotum or testicle AND [2] present < 1 hour AND [3] recurrent  (NO swelling)  Answer Assessment - Initial Assessment Questions 1. LOCATION and RADIATION: "Where is the pain located?"      Both testicles 2. QUALITY: "What does the pain feel like?"  (e.g., sharp, dull, aching, burning)     Aching comes and goes 3. SEVERITY: "How bad is the pain?"  (Scale 1-10; or mild, moderate, severe)   - MILD (1-3): doesn't interfere with normal activities    - MODERATE (4-7): interferes with normal activities (e.g., work or school) or awakens from sleep   - SEVERE (8-10): excruciating pain, unable to do any normal activities, difficulty walking     4-5 4. ONSET: "When did the pain start?"     Wednesday, more yesterday 5. PATTERN: "Does it come and go, or has it been constant since it started?"     Come and go, feels uncomfortable 6. SCROTAL APPEARANCE: "What does the scrotum look like?" "Is there any swelling or redness?"      I don't know 7. HERNIA: "Has a doctor ever told you that you have a hernia?"     No 8. OTHER SYMPTOMS: "Do  you have any other symptoms?" (e.g., fever, abdominal pain, vomiting, difficulty passing urine)     Soreness to right back, lower abdomen discomfort  Protocols used: SCROTAL PAIN-A-AH

## 2017-10-24 ENCOUNTER — Other Ambulatory Visit: Payer: Self-pay | Admitting: Internal Medicine

## 2017-10-27 ENCOUNTER — Encounter: Payer: Self-pay | Admitting: Family Medicine

## 2017-10-27 ENCOUNTER — Ambulatory Visit: Payer: Medicare Other | Admitting: Family Medicine

## 2017-10-27 DIAGNOSIS — N5082 Scrotal pain: Secondary | ICD-10-CM

## 2017-10-27 NOTE — Progress Notes (Signed)
Scrotal pain.  He has B testicle pain last week.  Less pain now but he is "aware that something is happening."  Still a little sore.  No trauma.  No rash.  No FCNAVD.  He doesn't feel sick o/w.  No noted lump or hernia.  No inc in pain with cough.  He does frequent lifting at work.   Prev PSA 4.6.   Has been on finasteride since 2012 per EMR/patient report.  Prev bx neg.  No burning with urination.  He has some urinary urgency at baseline.  No blood in urine.    No interventions re: testicle pain in the meantime.    Meds, vitals, and allergies reviewed.   ROS: Per HPI unless specifically indicated in ROS section   nad ncat rrr ctab Abd soft, not ttp No clear hernia felt in the groin B Testicles w/o mass  Normal ext genitalia, no discharge.

## 2017-10-27 NOTE — Patient Instructions (Signed)
You could have had separate issues, likely a muscle strain in addition to testicle pain.  I would limit heavy lifting if painful and update [place] as needed.  Since you appear to be better, then give this a little more time.  Take care.  Glad to see you.

## 2017-10-28 DIAGNOSIS — N5082 Scrotal pain: Secondary | ICD-10-CM | POA: Insufficient documentation

## 2017-10-28 NOTE — Assessment & Plan Note (Signed)
He could have had separate issues, likely a muscle strain in addition to testicle pain.  I would limit heavy lifting if painful and update [place] as needed.  Since he is better in the meantime, then give this a little more time.  Update [place] as needed.  No emergent symptoms.  He agrees with plan.  Okay for outpatient follow-up.

## 2018-02-07 ENCOUNTER — Other Ambulatory Visit: Payer: Self-pay | Admitting: Internal Medicine

## 2018-05-18 ENCOUNTER — Encounter: Payer: Self-pay | Admitting: Internal Medicine

## 2018-05-18 ENCOUNTER — Ambulatory Visit: Payer: Medicare Other | Admitting: Internal Medicine

## 2018-05-18 ENCOUNTER — Other Ambulatory Visit: Payer: Self-pay

## 2018-05-18 VITALS — BP 140/86 | HR 53 | Temp 97.8°F | Ht 64.0 in | Wt 186.0 lb

## 2018-05-18 DIAGNOSIS — S39012A Strain of muscle, fascia and tendon of lower back, initial encounter: Secondary | ICD-10-CM | POA: Diagnosis not present

## 2018-05-18 MED ORDER — TIZANIDINE HCL 2 MG PO TABS: 2.0000 mg | ORAL_TABLET | Freq: Every evening | ORAL | 0 refills | Status: DC | PRN

## 2018-05-18 NOTE — Assessment & Plan Note (Signed)
Low back and groin Likely related to bending and lifting limbs recently Continue tylenol Could use ibuprofen sparingly Continue heat--consider rub or patch Will give muscle relaxer for bedtime

## 2018-05-18 NOTE — Progress Notes (Signed)
Subjective:    Patient ID: Bobby Woods, male    DOB: [birth year], 73 y.o.   MRN: [id number]  HPI Here due to back pain  Started about 10 days ago Doesn't remember any injury Works at [employer] hasn't been lifting coffins or anything No recent work at [employer] Lateral upper left lumbar pain Now radiates around flank and to groin Affecting sleep--awakens him with pain Tried tylenol--helps some Tried heat---helped (but only briefly) No radiation down leg--and no leg weakness  Pain is constant--even if walking, etc No bulges or apparent hernia---other than slightly at umbilicus  Current Outpatient Medications on File Prior to Visit  Medication Sig Dispense Refill  . amLODipine (NORVASC) 5 MG tablet TAKE 1 TABLET BY MOUTH EVERY DAY 90 tablet 3  . aspirin 81 MG tablet Take 81 mg by mouth every other day.     . finasteride (PROSCAR) 5 MG tablet TAKE 1 TABLET BY MOUTH EVERY DAY 90 tablet 1  . metoprolol succinate (TOPROL-XL) 25 MG 24 hr tablet TAKE 1 TABLET BY MOUTH EVERY DAY 90 tablet 3  . omeprazole (PRILOSEC) 20 MG capsule TAKE 1 CAPSULE BY MOUTH EVERY DAY 90 capsule 3   No current facility-administered medications on file prior to visit.     Allergies  Allergen Reactions  . Zithromax [Azithromycin Dihydrate]     GI upset    Past Medical History:  Diagnosis Date  . Arthritis   . Blood transfusion without reported diagnosis    age 67 year   . BPH (benign prostatic hypertrophy)   . GERD (gastroesophageal reflux disease)   . Hypertension    on medicines     Past Surgical History:  Procedure Laterality Date  . COLONOSCOPY  2006  . HAND SURGERY    . [place]  . PROSTATE BIOPSY  2011   negative  . [place]   left, right 05/07  . ROTATOR CUFF REPAIR  10/09   right  . TONSILLECTOMY  1972    Family History  Problem Relation Age of Onset  . Cancer Father        prostate  . Cancer Paternal Uncle    prostate cancer  . Colon cancer Neg Hx   . Rectal cancer Neg Hx   . Stomach cancer Neg Hx     Social History   Socioeconomic History  . Marital status: Married    Spouse name: Not on file  . Number of children: 3  . Years of education: Not on file  . Highest education level: Not on file  Occupational History  . 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 Occupation: Custodial/funeral assistance    Comment: Financial controller  Social Needs  . Financial resource strain: Not on file  . Food insecurity:    Worry: Not on file    Inability: Not on file  . Transportation needs:    Medical: Not on file    Non-medical: Not on file  Tobacco Use  . Smoking status: Never Smoker  . Smokeless tobacco: Never Used  Substance and Sexual Activity  . Alcohol use: No  . Drug use: No  . Sexual activity: Not on file  Lifestyle  . Physical activity:    Days per week: Not on file    Minutes per session:  Not on file  . Stress: Not on file  Relationships  . Social connections:    Talks on phone: Not on file    Gets together: Not on file    Attends religious service: Not on file    Active member of club or organization: Not on file    Attends meetings of clubs or organizations: Not on file    Relationship status: Not on file  . Intimate partner violence:    Fear of current or ex partner: Not on file    Emotionally abused: Not on file    Physically abused: Not on file    Forced sexual activity: Not on file  Other Topics Concern  . Not on file  Social History Narrative   Has living will   Wife has Bobby Woods health care POA and alternate is Bobby Woods (cousin)   Would accept attempts at resuscitation but no prolonged artificial ventilation   Not sure about a feeding tube   Review of Systems No dysuria or hematuria Nocturia down to 2 per night     Objective:   Physical Exam   Constitutional: He appears well-developed. No distress.  GI:  Very slight bulge at upper umbilicus No tenderness No inguinal tenderness or apparent hernia  Musculoskeletal:     Comments: Mild low thoracic spine tenderness Left lumbar paraspinal tenderness and radiates around flank ROM fairly normal in hips SLR negative bilaterally  Neurological:  Gait normal No focal leg weakness--though gets groin pain with hip abduction           Assessment & Plan:

## 2018-05-21 ENCOUNTER — Other Ambulatory Visit: Payer: Self-pay | Admitting: Internal Medicine

## 2018-05-25 ENCOUNTER — Other Ambulatory Visit: Payer: Self-pay | Admitting: Internal Medicine

## 2018-07-20 ENCOUNTER — Ambulatory Visit (INDEPENDENT_AMBULATORY_CARE_PROVIDER_SITE_OTHER): Payer: Medicare Other | Admitting: Internal Medicine

## 2018-07-20 ENCOUNTER — Other Ambulatory Visit: Payer: Self-pay

## 2018-07-20 ENCOUNTER — Encounter: Payer: Self-pay | Admitting: Internal Medicine

## 2018-07-20 VITALS — BP 116/72 | HR 58 | Temp 98.7°F | Ht 64.5 in | Wt 183.0 lb

## 2018-07-20 DIAGNOSIS — I10 Essential (primary) hypertension: Secondary | ICD-10-CM | POA: Diagnosis not present

## 2018-07-20 DIAGNOSIS — N183 Chronic kidney disease, stage 3 unspecified: Secondary | ICD-10-CM | POA: Insufficient documentation

## 2018-07-20 DIAGNOSIS — K219 Gastro-esophageal reflux disease without esophagitis: Secondary | ICD-10-CM

## 2018-07-20 DIAGNOSIS — Z0000 Encounter for general adult medical examination without abnormal findings: Secondary | ICD-10-CM

## 2018-07-20 DIAGNOSIS — N401 Enlarged prostate with lower urinary tract symptoms: Secondary | ICD-10-CM | POA: Diagnosis not present

## 2018-07-20 DIAGNOSIS — Z7189 Other specified counseling: Secondary | ICD-10-CM

## 2018-07-20 DIAGNOSIS — R972 Elevated prostate specific antigen [PSA]: Secondary | ICD-10-CM | POA: Diagnosis not present

## 2018-07-20 DIAGNOSIS — N138 Other obstructive and reflux uropathy: Secondary | ICD-10-CM

## 2018-07-20 LAB — LIPID PANEL
Cholesterol: 106 mg/dL (ref 0–200)
HDL: 48 mg/dL (ref >39.00)
LDL Cholesterol: 48 mg/dL (ref 0–99)
NonHDL: 58.16
Total CHOL/HDL Ratio: 2
Triglycerides: 51 mg/dL (ref 0.0–149.0)
VLDL: 10.2 mg/dL (ref 0.0–40.0)

## 2018-07-20 LAB — HEPATIC FUNCTION PANEL
ALT: 20 U/L (ref 0–53)
AST: 22 U/L (ref 0–37)
Albumin: 4.3 g/dL (ref 3.5–5.2)
Alkaline Phosphatase: 61 U/L (ref 39–117)
Bilirubin, Direct: 0.2 mg/dL (ref 0.0–0.3)
Total Bilirubin: 0.8 mg/dL (ref 0.2–1.2)
Total Protein: 6.4 g/dL (ref 6.0–8.3)

## 2018-07-20 LAB — RENAL FUNCTION PANEL
Albumin: 4.3 g/dL (ref 3.5–5.2)
BUN: 18 mg/dL (ref 6–23)
CO2: 28 mEq/L (ref 19–32)
Calcium: 8.9 mg/dL (ref 8.4–10.5)
Chloride: 104 mEq/L (ref 96–112)
Creatinine, Ser: 1.22 mg/dL (ref 0.40–1.50)
GFR: 58.28 mL/min — ABNORMAL LOW (ref >60.00)
Glucose, Bld: 108 mg/dL — ABNORMAL HIGH (ref 70–99)
Phosphorus: 3.1 mg/dL (ref 2.3–4.6)
Potassium: 4.4 mEq/L (ref 3.5–5.1)
Sodium: 139 mEq/L (ref 135–145)

## 2018-07-20 LAB — T4, FREE: Free T4: 0.73 ng/dL (ref 0.60–1.60)

## 2018-07-20 LAB — CBC
HCT: 44.9 % (ref 39.0–52.0)
Hemoglobin: 15.2 g/dL (ref 13.0–17.0)
MCHC: 34 g/dL (ref 30.0–36.0)
MCV: 93.3 fl (ref 78.0–100.0)
Platelets: 195 10*3/uL (ref 150.0–400.0)
RBC: 4.81 Mil/uL (ref 4.22–5.81)
RDW: 13.1 % (ref 11.5–15.5)
WBC: 6.3 10*3/uL (ref 4.0–10.5)

## 2018-07-20 LAB — PSA: PSA: 4.05 ng/mL — ABNORMAL HIGH (ref 0.10–4.00)

## 2018-07-20 MED ORDER — AMLODIPINE BESYLATE 5 MG PO TABS: 5.0000 mg | ORAL_TABLET | Freq: Every day | ORAL | 3 refills | Status: DC

## 2018-07-20 NOTE — Assessment & Plan Note (Signed)
Controlled on the PPI Didn't tolerate wean

## 2018-07-20 NOTE — Assessment & Plan Note (Signed)
See social history 

## 2018-07-20 NOTE — Assessment & Plan Note (Signed)
I have personally reviewed the Medicare Annual Wellness questionnaire and have noted 1. The patient's medical and social history 2. Their use of alcohol, tobacco or illicit drugs 3. Their current medications and supplements 4. The patient's functional ability including ADL's, fall risks, home safety risks and hearing or visual             impairment. 5. Diet and physical activities 6. Evidence for depression or mood disorders  The patients weight, height, BMI and visual acuity have been recorded in the chart I have made referrals, counseling and provided education to the patient based review of the above and I have provided the pt with a written personalized care plan for preventive services.  I have provided you with a copy of your personalized plan for preventive services. Please take the time to review along with your updated medication list.  Fairly healthy but discussed resistance/strength work Flu vaccine in the fall Colon due next year shingrix recommended but probably too much money

## 2018-07-20 NOTE — Assessment & Plan Note (Signed)
Somewhat better Continues on the finasteride

## 2018-07-20 NOTE — Assessment & Plan Note (Signed)
Borderline low Will recheck today

## 2018-07-20 NOTE — Assessment & Plan Note (Signed)
BP Readings from Last 3 Encounters:  07/20/18 116/72  05/18/18 140/86  10/27/17 140/74   Good control Due for labs

## 2018-07-20 NOTE — Progress Notes (Signed)
Subjective:    Patient ID: Bobby Woods, male    DOB: 09/13/1945, 73 y.o.   MRN: [id number]  HPI Here for Medicare wellness and follow up of chronic health conditions Reviewed form and advanced directives Reviewed other doctors No alcohol or tobacco Tries to stay active with walking and his work Vision is okay--needs new glasses though Hearing not good---will need aides Wife not doing well with Parkinson's---he needs to care for her and do most of the instrumental ADLs Son helps some in house---daughter unable No falls No depression or anhedonia. Lots of stress with bipolar and angry son No memory problems  No chest pain Does feel "my heart speed up" at times. No caffeine of note. Mostly in bed. Not really fast and very brief No breathing problems No dizziness or syncope No edema Takes the ASA 4 days per week only  Urine stream is fairly good Nocturia is down to 1-2 per day Feels he empties  Takes the omeprazole daily No heartburn No dysphagia Didn't tolerate skipping doses  Some pain in hands at times---stiffness Not as bad lately  Reviewed labs GFR noted in the high 50's  Current Outpatient Medications on File Prior to Visit  Medication Sig Dispense Refill  . amLODipine (NORVASC) 5 MG tablet TAKE 1 TABLET BY MOUTH EVERY DAY 90 tablet 3  . aspirin 81 MG tablet Take 81 mg by mouth every other day.     . finasteride (PROSCAR) 5 MG tablet TAKE 1 TABLET BY MOUTH EVERY DAY 90 tablet 0  . metoprolol succinate (TOPROL-XL) 25 MG 24 hr tablet TAKE 1 TABLET BY MOUTH EVERY DAY 90 tablet 3  . omeprazole (PRILOSEC) 20 MG capsule TAKE 1 CAPSULE BY MOUTH EVERY DAY 90 capsule 3   No current facility-administered medications on file prior to visit.     Allergies  Allergen Reactions  . Zithromax [Azithromycin Dihydrate]     GI upset    Past Medical History:  Diagnosis Date  . Arthritis   . Blood transfusion without reported diagnosis    age 13 year   . BPH (benign  prostatic hypertrophy)   . GERD (gastroesophageal reflux disease)   . Hypertension    on medicines     Past Surgical History:  Procedure Laterality Date  . COLONOSCOPY  2006  . HAND SURGERY    . [place]  . PROSTATE BIOPSY  2011   negative  . [place]   left, right 05/07  . ROTATOR CUFF REPAIR  10/09   right  . TONSILLECTOMY  1972    Family History  Problem Relation Age of Onset  . Cancer Father        prostate  . Cancer Paternal Uncle        prostate cancer  . Colon cancer Neg Hx   . Rectal cancer Neg Hx   . Stomach cancer Neg Hx     Social History   Socioeconomic History  . Marital status: Married    Spouse name: Not on file  . Number of children: 3  . Years of education: Not on file  . Highest education level: Not on file  Occupational History  . 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 Occupation: Quality assurance--cleaning    Employer: [employer]    Comment:    . Occupation: Custodial/funeral assistance    Comment: Financial controller  Social Needs  .  Financial resource strain: Not on file  . Food insecurity    Worry: Not on file    Inability: Not on file  . Transportation needs    Medical: Not on file    Non-medical: Not on file  Tobacco Use  . Smoking status: Never Smoker  . Smokeless tobacco: Never Used  Substance and Sexual Activity  . Alcohol use: No  . Drug use: No  . Sexual activity: Not on file  Lifestyle  . Physical activity    Days per week: Not on file    Minutes per session: Not on file  . Stress: Not on file  Relationships  . Social Herbalist on phone: Not on file    Gets together: Not on file    Attends religious service: Not on file    Active member of club or organization: Not on file    Attends meetings of clubs or organizations: Not on file    Relationship status: Not on file  . Intimate partner violence    Fear of current or ex  partner: Not on file    Emotionally abused: Not on file    Physically abused: Not on file    Forced sexual activity: Not on file  Other Topics Concern  . Not on file  Social History Narrative   Has living will   Wife has [place] health care POA and alternate is [name] (cousin)   Would accept attempts at resuscitation but no prolonged artificial ventilation   Not sure about a feeding tube   Review of Systems Back is better Had precancerous lesion taken off abdomen---will have regular follow up No headaches Appetite is fine Weight is stable Sleep is variable---feels like it is light at times and awakens easily Wears seat belt Bowels have been fine. No blood Teeth okay---generally keeps up with the dentist    Objective:   Physical Exam  Constitutional: He is oriented to person, place, and time. He appears well-developed. No distress.  HENT:  Mouth/Throat: Oropharynx is clear and moist. No oropharyngeal exudate.  Neck: No thyromegaly present.  Cardiovascular: Regular rhythm, normal heart sounds and intact distal pulses. Exam reveals no gallop.  No murmur heard. Rate in high 50's  Respiratory: Effort normal and breath sounds normal. No respiratory distress. He has no wheezes. He has no rales.  GI: Soft. There is no abdominal tenderness.  Musculoskeletal:        General: No tenderness or edema.  Lymphadenopathy:    He has no cervical adenopathy.  Neurological: He is alert and oriented to person, place, and time.  President--- "Dwaine Deter, [place]" [phone] [id number] Recall 3/3  Skin: No rash noted. No erythema.  Psychiatric: He has a normal mood and affect. His behavior is normal.           Assessment & Plan:

## 2018-07-20 NOTE — Progress Notes (Signed)
Hearing Screening   125Hz  250Hz  500Hz  1000Hz  2000Hz  3000Hz  4000Hz  6000Hz  8000Hz   Right ear:           Left ear:           Comments: March 2020. Needs hearing aids  Vision Screening Comments: July 2019. Appt in August 2020

## 2018-07-20 NOTE — Assessment & Plan Note (Signed)
Discussed Will recheck

## 2018-08-20 ENCOUNTER — Other Ambulatory Visit: Payer: Self-pay | Admitting: Internal Medicine

## 2018-10-01 ENCOUNTER — Other Ambulatory Visit: Payer: Self-pay | Admitting: Internal Medicine

## 2018-10-08 ENCOUNTER — Encounter: Payer: Self-pay | Admitting: Family Medicine

## 2018-10-08 ENCOUNTER — Other Ambulatory Visit: Payer: Self-pay

## 2018-10-08 ENCOUNTER — Ambulatory Visit (INDEPENDENT_AMBULATORY_CARE_PROVIDER_SITE_OTHER): Payer: Medicare Other | Admitting: Family Medicine

## 2018-10-08 VITALS — BP 142/70 | HR 55 | Temp 97.9°F | Ht 64.5 in | Wt 180.6 lb

## 2018-10-08 DIAGNOSIS — M79673 Pain in unspecified foot: Secondary | ICD-10-CM

## 2018-10-08 DIAGNOSIS — N401 Enlarged prostate with lower urinary tract symptoms: Secondary | ICD-10-CM

## 2018-10-08 DIAGNOSIS — N138 Other obstructive and reflux uropathy: Secondary | ICD-10-CM | POA: Diagnosis not present

## 2018-10-08 DIAGNOSIS — Z23 Encounter for immunization: Secondary | ICD-10-CM

## 2018-10-08 NOTE — Progress Notes (Signed)
B 1st toe pain, R>L.  Started about 2-3 weeks ago.  Gradually worse in the meantime.  No h/o gout. No foot pain o/w.  No trauma.  No pain with the sheet laying across his feet at night.  Variable level of pain with walking.  He has been working in new shoes since this summer but the pain didn't start until more recently.  He did previously wear wide shoes but the last pair he got was normal with.  He has noted chronic changes in ejaculatory volume, decreased.  No blood in urine or semen noted by patient.  He is on finasteride and this can cause this symptom.  Discussed.  This is not a new issue for the patient.  Meds, vitals, and allergies reviewed.   ROS: Per HPI unless specifically indicated in ROS section   nad ncat Bilateral foot exam similar without pain on the first IP or MTP joints bilaterally.  No MTP redness or erythema.  Midfoot not tender.  Normal dorsalis pedis pulse.  Normal sensation on the foot.  Not tender to palpation on the medial or lateral malleoli.

## 2018-10-08 NOTE — Patient Instructions (Signed)
See if not using the last lace hole/opening the toe box helps.  Update [place] as needed.  Take care.  Glad to see you.

## 2018-10-10 DIAGNOSIS — M79676 Pain in unspecified toe(s): Secondary | ICD-10-CM | POA: Insufficient documentation

## 2018-10-10 NOTE — Assessment & Plan Note (Signed)
Likely not gout, likely compression from narrow toe box in his shoes.  I undid his laces on his right shoe and re-laced them without using the distal eyelit and he clearly felt better without any pain on standing.  If this continues for patient he can either get wide toe box shoes or change the lacing on the other shoe.

## 2018-10-10 NOTE — Assessment & Plan Note (Signed)
Likely with ejaculatory changes related to finasteride.  Discussed with patient.  Follow-up with PCP if other symptoms.

## 2018-11-04 ENCOUNTER — Ambulatory Visit (INDEPENDENT_AMBULATORY_CARE_PROVIDER_SITE_OTHER): Payer: Medicare Other | Admitting: Family Medicine

## 2018-11-04 ENCOUNTER — Encounter: Payer: Self-pay | Admitting: Family Medicine

## 2018-11-04 ENCOUNTER — Other Ambulatory Visit: Payer: Self-pay

## 2018-11-04 DIAGNOSIS — M545 Low back pain, unspecified: Secondary | ICD-10-CM

## 2018-11-04 DIAGNOSIS — M79673 Pain in unspecified foot: Secondary | ICD-10-CM

## 2018-11-04 MED ORDER — TIZANIDINE HCL 2 MG PO TABS: 2.0000 mg | ORAL_TABLET | Freq: Every evening | ORAL | 0 refills | Status: DC | PRN

## 2018-11-04 NOTE — Progress Notes (Signed)
Medial and lateral R1st toe sore, more medially.  No ulceration.  No skin breakdown.  No pain all other toes on the right foot.  No left foot pain.  He still has R lower back pain, sore locally.  No dysuria, no fevers.  No rash.  No bruising.  Taking tylenol prn.  Prev used muscle relaxer with some relief.  Not radiating down the leg.  He hasn't been lifting recently.  Out of work for 3 weeks after skin lesion excised, with lifting restrictions.    Meds, vitals, and allergies reviewed.   ROS: Per HPI unless specifically indicated in ROS section   nad ncat rrr ctab Right foot with normal inspection.  Normal dorsalis pedis pulse.  No ulceration or erythema at the right first MTP joint or IP joint.  Normal range of motion of the right first TP and IP joint.  Toenails not ingrown.  No paronychia.  Normal sensation. Right lower back tender to palpation but no midline pain.  No CVA pain.  Able to bear weight.

## 2018-11-04 NOTE — Patient Instructions (Signed)
Your toe doesn't look infected and the nail looks fine.  This isn't typical for gout.  I would try extra cushion with an insert to see if that helps.   Use the muscle relaxer for your back and update [place] as needed.  Take care.  Glad to see you.

## 2018-11-07 NOTE — Assessment & Plan Note (Signed)
He can try tizanidine with routine caution and update [place] as needed.  Discussed gentle stretching and heat and at this point still okay for outpatient follow-up.  He agrees.

## 2018-11-07 NOTE — Assessment & Plan Note (Signed)
He could have distal metatarsalgia and I discussed with him about getting extra padding for his shoes and then following up with PCP if needed.  There is no other obvious source or issue at this point.  I do not suspect a stress fracture and imaging would likely not change the plan.  Discussed with patient.  He agrees.

## 2018-11-11 ENCOUNTER — Other Ambulatory Visit: Payer: Self-pay | Admitting: Family Medicine

## 2018-11-11 NOTE — Telephone Encounter (Signed)
Electronic refill request. Tizanidine Last office visit:   11/04/2018 Last Filled:    30 tablet 0 11/04/2018  Please advise.

## 2018-11-12 ENCOUNTER — Ambulatory Visit (INDEPENDENT_AMBULATORY_CARE_PROVIDER_SITE_OTHER): Payer: Medicare Other | Admitting: Internal Medicine

## 2018-11-12 ENCOUNTER — Ambulatory Visit (INDEPENDENT_AMBULATORY_CARE_PROVIDER_SITE_OTHER)
Admission: RE | Admit: 2018-11-12 | Discharge: 2018-11-12 | Disposition: A | Discharge status: Home / Self Care | Payer: Medicare Other | Source: Ambulatory Visit | Attending: Internal Medicine | Admitting: Internal Medicine

## 2018-11-12 ENCOUNTER — Other Ambulatory Visit: Payer: Self-pay

## 2018-11-12 ENCOUNTER — Encounter: Payer: Self-pay | Admitting: Internal Medicine

## 2018-11-12 VITALS — BP 112/76 | HR 64 | Temp 98.3°F | Ht 64.5 in | Wt 175.0 lb

## 2018-11-12 DIAGNOSIS — G8929 Other chronic pain: Secondary | ICD-10-CM

## 2018-11-12 DIAGNOSIS — M545 Low back pain, unspecified: Secondary | ICD-10-CM

## 2018-11-12 DIAGNOSIS — M79674 Pain in right toe(s): Secondary | ICD-10-CM | POA: Diagnosis not present

## 2018-11-12 MED ORDER — TIZANIDINE HCL 2 MG PO TABS: 2.0000 mg | ORAL_TABLET | Freq: Every evening | ORAL | 0 refills | Status: DC | PRN

## 2018-11-12 NOTE — Progress Notes (Signed)
Subjective:    Patient ID: Bobby Woods, male    DOB: 06/13/1945, 73 y.o.   MRN: [id number]  HPI Here due to foot and back pain  Both great toes have been hurting--started about a 1-2 months ago Left one is better but right persists Got bigger toe box shoe--seemed to help the left one Occasional heel pain Also some soreness under 3rd and 4th toes No pain unless weight bearing There is some tenderness No history of gout  The back has continued Muscle relaxers help some--but just won't go away Right lateral pain---and occasionally in upper back Occasional in groin and then left leg  Current Outpatient Medications on File Prior to Visit  Medication Sig Dispense Refill  . amLODipine (NORVASC) 5 MG tablet Take 1 tablet (5 mg total) by mouth daily. 90 tablet 3  . aspirin 81 MG tablet Take 81 mg by mouth every other day.     . finasteride (PROSCAR) 5 MG tablet TAKE 1 TABLET BY MOUTH EVERY DAY 90 tablet 3  . metoprolol succinate (TOPROL-XL) 25 MG 24 hr tablet TAKE 1 TABLET BY MOUTH EVERY DAY 90 tablet 3  . omeprazole (PRILOSEC) 20 MG capsule TAKE 1 CAPSULE BY MOUTH EVERY DAY 90 capsule 3  . tiZANidine (ZANAFLEX) 2 MG tablet TAKE 1-2 TABLETS (2-4 MG TOTAL) BY MOUTH AT BEDTIME AS NEEDED FOR MUSCLE SPASMS. 30 tablet 0   No current facility-administered medications on file prior to visit.     Allergies  Allergen Reactions  . Zithromax [Azithromycin Dihydrate]     GI upset    Past Medical History:  Diagnosis Date  . Arthritis   . Blood transfusion without reported diagnosis    age 38 year   . BPH (benign prostatic hypertrophy)   . GERD (gastroesophageal reflux disease)   . Hypertension    on medicines     Past Surgical History:  Procedure Laterality Date  . COLONOSCOPY  2006  . HAND SURGERY    . [place]  . PROSTATE BIOPSY  2011   negative  . [place]   left, right 05/07  . ROTATOR CUFF REPAIR  10/09   right  .  TONSILLECTOMY  1972    Family History  Problem Relation Age of Onset  . Cancer Father        prostate  . Cancer Paternal Uncle        prostate cancer  . Colon cancer Neg Hx   . Rectal cancer Neg Hx   . Stomach cancer Neg Hx     Social History   Socioeconomic History  . Marital status: Married    Spouse name: Not on file  . Number of children: 3  . Years of education: Not on file  . Highest education level: Not on file  Occupational History  . 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 Occupation: Quality assurance--cleaning    Employer: [employer]    Comment:    . Occupation: Custodial/funeral assistance    Comment: Financial controller  Social Needs  . Financial resource strain: Not on file  . Food insecurity    Worry: Not on file    Inability: Not on file  . Transportation needs    Medical: Not on file    Non-medical: Not on file  Tobacco Use  . Smoking status: Never Smoker  . Smokeless tobacco: Never Used  Substance and  Sexual Activity  . Alcohol use: No  . Drug use: No  . Sexual activity: Not on file  Lifestyle  . Physical activity    Days per week: Not on file    Minutes per session: Not on file  . Stress: Not on file  Relationships  . Social Herbalist on phone: Not on file    Gets together: Not on file    Attends religious service: Not on file    Active member of club or organization: Not on file    Attends meetings of clubs or organizations: Not on file    Relationship status: Not on file  . Intimate partner violence    Fear of current or ex partner: Not on file    Emotionally abused: Not on file    Physically abused: Not on file    Forced sexual activity: Not on file  Other Topics Concern  . Not on file  Social History Narrative   Has living will   Wife has [place] health care POA and alternate is [name] (cousin)   Would accept attempts at resuscitation but no prolonged artificial ventilation    Not sure about a feeding tube   Review of Systems     Objective:   Physical Exam  Musculoskeletal:     Comments: Thickening in DIP right great toe Tenderness distally along medial portion MTP and foot are quiet           Assessment & Plan:

## 2018-11-12 NOTE — Assessment & Plan Note (Signed)
Doesn't appear gouty Stressed with constant walking for work Better shoes haven't helped Will check x-ray---consider podiatrist

## 2018-11-12 NOTE — Assessment & Plan Note (Signed)
Seems to be muscular but not improving Will refer to PT

## 2018-11-26 ENCOUNTER — Ambulatory Visit: Payer: Medicare Other | Admitting: Podiatry

## 2018-11-26 ENCOUNTER — Other Ambulatory Visit: Payer: Self-pay

## 2018-11-26 ENCOUNTER — Encounter: Payer: Self-pay | Admitting: Podiatry

## 2018-11-26 DIAGNOSIS — L600 Ingrowing nail: Secondary | ICD-10-CM | POA: Diagnosis not present

## 2018-11-26 DIAGNOSIS — M7751 Other enthesopathy of right foot: Secondary | ICD-10-CM

## 2018-11-26 MED ORDER — MELOXICAM 15 MG PO TABS: 15.0000 mg | ORAL_TABLET | Freq: Every day | ORAL | 1 refills | Status: DC

## 2018-11-30 NOTE — Progress Notes (Signed)
   Subjective: Patient presents today for evaluation of pain to the medial and lateral borders of the right great toe that began about two months ago. Patient is concerned for possible ingrown nail. Applying pressure to the toe increases the pain. He has been taking Tylenol for treatment. Patient presents today for further treatment and evaluation.  Past Medical History:  Diagnosis Date  . Arthritis   . Blood transfusion without reported diagnosis    age 73 year   . BPH (benign prostatic hypertrophy)   . GERD (gastroesophageal reflux disease)   . Hypertension    on medicines     Objective:  General: Well developed, nourished, in no acute distress, alert and oriented x3   Dermatology: Skin is warm, dry and supple bilateral. Medial and lateral borders of the right great toe appears to be erythematous with evidence of an ingrowing nail. Pain on palpation noted to the border of the nail fold. The remaining nails appear unremarkable at this time. There are no open sores, lesions.  Vascular: Dorsalis Pedis artery and Posterior Tibial artery pedal pulses palpable. No lower extremity edema noted.   Neruologic: Grossly intact via light touch bilateral.  Musculoskeletal: Pain with palpation noted to the right great toe. Muscular strength within normal limits in all groups bilateral. Normal range of motion noted to all pedal and ankle joints.   Assesement: #1 Paronychia with ingrowing nail medial and lateral borders right great toe  #2 Pain in toe #3 Incurvated nail #4 Right great toe capsulitis   Plan of Care:  1. Patient evaluated.  2. Mechanical debridement of the right great toenail performed using a nail nipper. Filed with dremel without incident.  3. Injection of 0.5 mLs Celestone Soluspan injected into the 1st MPJ of the right great toe.  4. Prescription for Meloxicam provided to patient. 5. Return to clinic in 4 weeks.   Renaldo Fiddler, daughter, is a patient.    Edrick Kins, DPM  [facility]  Dr. Edrick Kins, [place]                                        [place], [place] [address]                Office [phone]  Fax [phone]

## 2018-12-10 ENCOUNTER — Other Ambulatory Visit: Payer: Self-pay | Admitting: Internal Medicine

## 2018-12-13 NOTE — Telephone Encounter (Signed)
Last filled 11-15-18 #60 Last OV 11-12-18 Acute Next OV 07-22-19 [facility]

## 2018-12-27 ENCOUNTER — Other Ambulatory Visit: Payer: Self-pay | Admitting: Internal Medicine

## 2018-12-27 NOTE — Telephone Encounter (Signed)
Pharmacy is requesting 90 day supply for pt.   Last filled 12-13-18 #60 Last OV 11-12-18 Next OV 07-22-19 [facility]

## 2018-12-31 ENCOUNTER — Ambulatory Visit: Payer: Medicare Other | Admitting: Podiatry

## 2018-12-31 ENCOUNTER — Other Ambulatory Visit: Payer: Self-pay

## 2018-12-31 ENCOUNTER — Encounter: Payer: Self-pay | Admitting: Podiatry

## 2018-12-31 ENCOUNTER — Ambulatory Visit (INDEPENDENT_AMBULATORY_CARE_PROVIDER_SITE_OTHER): Payer: Medicare Other | Admitting: Podiatry

## 2018-12-31 DIAGNOSIS — L600 Ingrowing nail: Secondary | ICD-10-CM

## 2018-12-31 DIAGNOSIS — M7751 Other enthesopathy of right foot: Secondary | ICD-10-CM | POA: Diagnosis not present

## 2018-12-31 MED ORDER — MELOXICAM 15 MG PO TABS: 15.0000 mg | ORAL_TABLET | Freq: Every day | ORAL | 1 refills | Status: DC

## 2019-01-01 ENCOUNTER — Other Ambulatory Visit: Payer: Self-pay

## 2019-01-01 ENCOUNTER — Emergency Department: Payer: Medicare Other

## 2019-01-01 ENCOUNTER — Emergency Department
Admission: EM | Admit: 2019-01-01 | Discharge: 2019-01-01 | Disposition: A | Discharge status: Home / Self Care | Payer: Medicare Other | Attending: Emergency Medicine | Admitting: Emergency Medicine

## 2019-01-01 ENCOUNTER — Encounter: Payer: Self-pay | Admitting: Emergency Medicine

## 2019-01-01 DIAGNOSIS — R1011 Right upper quadrant pain: Secondary | ICD-10-CM | POA: Diagnosis not present

## 2019-01-01 DIAGNOSIS — N183 Chronic kidney disease, stage 3 unspecified: Secondary | ICD-10-CM | POA: Diagnosis not present

## 2019-01-01 DIAGNOSIS — Z7982 Long term (current) use of aspirin: Secondary | ICD-10-CM | POA: Insufficient documentation

## 2019-01-01 DIAGNOSIS — R1031 Right lower quadrant pain: Secondary | ICD-10-CM | POA: Diagnosis present

## 2019-01-01 DIAGNOSIS — Z79899 Other long term (current) drug therapy: Secondary | ICD-10-CM | POA: Insufficient documentation

## 2019-01-01 DIAGNOSIS — I129 Hypertensive chronic kidney disease with stage 1 through stage 4 chronic kidney disease, or unspecified chronic kidney disease: Secondary | ICD-10-CM | POA: Diagnosis not present

## 2019-01-01 LAB — URINALYSIS, COMPLETE (UACMP) WITH MICROSCOPIC
Bacteria, UA: NONE SEEN
Bilirubin Urine: NEGATIVE
Glucose, UA: 150 mg/dL — AB
Hgb urine dipstick: NEGATIVE
Ketones, ur: NEGATIVE mg/dL
Leukocytes,Ua: NEGATIVE
Nitrite: NEGATIVE
Protein, ur: NEGATIVE mg/dL
Specific Gravity, Urine: 1.02 (ref 1.005–1.030)
pH: 6 (ref 5.0–8.0)

## 2019-01-01 LAB — COMPREHENSIVE METABOLIC PANEL
ALT: 21 U/L (ref 0–44)
AST: 29 U/L (ref 15–41)
Albumin: 4.2 g/dL (ref 3.5–5.0)
Alkaline Phosphatase: 62 U/L (ref 38–126)
Anion gap: 12 (ref 5–15)
BUN: 18 mg/dL (ref 8–23)
CO2: 21 mmol/L — ABNORMAL LOW (ref 22–32)
Calcium: 9.1 mg/dL (ref 8.9–10.3)
Chloride: 103 mmol/L (ref 98–111)
Creatinine, Ser: 1 mg/dL (ref 0.61–1.24)
GFR calc Af Amer: >60 mL/min (ref >60)
GFR calc non Af Amer: >60 mL/min (ref >60)
Glucose, Bld: 167 mg/dL — ABNORMAL HIGH (ref 70–99)
Potassium: 4 mmol/L (ref 3.5–5.1)
Sodium: 136 mmol/L (ref 135–145)
Total Bilirubin: 0.9 mg/dL (ref 0.3–1.2)
Total Protein: 7 g/dL (ref 6.5–8.1)

## 2019-01-01 LAB — CBC
HCT: 44.1 % (ref 39.0–52.0)
Hemoglobin: 15.4 g/dL (ref 13.0–17.0)
MCH: 31.5 pg (ref 26.0–34.0)
MCHC: 34.9 g/dL (ref 30.0–36.0)
MCV: 90.2 fL (ref 80.0–100.0)
Platelets: 221 10*3/uL (ref 150–400)
RBC: 4.89 MIL/uL (ref 4.22–5.81)
RDW: 12.2 % (ref 11.5–15.5)
WBC: 10.8 10*3/uL — ABNORMAL HIGH (ref 4.0–10.5)
nRBC: 0 % (ref 0.0–0.2)

## 2019-01-01 LAB — LIPASE, BLOOD: Lipase: 21 U/L (ref 11–51)

## 2019-01-01 MED ORDER — OXYCODONE-ACETAMINOPHEN 5-325 MG PO TABS
1.0000 | ORAL_TABLET | Freq: Once | ORAL | Status: AC
  Administered 2019-01-01: 1 via ORAL
  Filled 2019-01-01: qty 1

## 2019-01-01 MED ORDER — IOHEXOL 300 MG/ML  SOLN
100.0000 mL | Freq: Once | INTRAMUSCULAR | Status: AC | PRN
  Administered 2019-01-01: 100 mL via INTRAVENOUS
  Filled 2019-01-01: qty 100

## 2019-01-01 NOTE — ED Notes (Signed)
Pt having lower groin pain.

## 2019-01-01 NOTE — ED Provider Notes (Signed)
[facility] Emergency Department Provider Note  ____________________________________________   First MD Initiated Contact with Patient 01/01/19 1607     (approximate)  I have reviewed the triage vital signs and the nursing notes.   HISTORY  Chief Complaint Abdominal Pain    HPI Bobby Woods is a 73 y.o. male presents emergency department complaint of right lower quadrant abdominal pain.  Patient states pain started yesterday.  Some nausea but no vomiting.  No diarrhea.  Pain is 10 out of 10.  Patient states he does still have his appendix and gallbladder.  No history of kidney stones.  Pain does not radiate into the testicle.    Past Medical History:  Diagnosis Date  . Arthritis   . Blood transfusion without reported diagnosis    age 30 year   . BPH (benign prostatic hypertrophy)   . GERD (gastroesophageal reflux disease)   . Hypertension    on medicines     Patient Active Problem List   Diagnosis Date Noted  . Great toe pain 10/10/2018  . Chronic renal disease, stage III 07/20/2018  . Elevated PSA 07/08/2017  . Lower back pain 11/10/2014  . Advance directive discussed with patient 04/25/2014  . Routine general medical examination at a health care facility 01/22/2011  . Actinic keratoses 01/22/2011  . GERD (gastroesophageal reflux disease)   . BPH with obstruction/lower urinary tract symptoms 01/03/2009  . OSTEOARTHRITIS 12/14/2006  . Essential hypertension, benign 05/25/2006    Past Surgical History:  Procedure Laterality Date  . COLONOSCOPY  2006  . HAND SURGERY    . [place]  . PROSTATE BIOPSY  2011   negative  . [place]   left, right 05/07  . ROTATOR CUFF REPAIR  10/09   right  . TONSILLECTOMY  1972    Prior to Admission medications   Medication Sig Start Date End Date Taking? Authorizing Provider  amLODipine (NORVASC) 5 MG tablet Take 1 tablet (5 mg total) by mouth daily. 07/20/18    Venia Carbon, MD  aspirin 81 MG tablet Take 81 mg by mouth every other day.     [provider]  finasteride (PROSCAR) 5 MG tablet TAKE 1 TABLET BY MOUTH EVERY DAY 10/01/18   Viviana Simpler I, MD  meloxicam (MOBIC) 15 MG tablet Take 1 tablet (15 mg total) by mouth daily. 12/31/18   Edrick Kins, DPM  metoprolol succinate (TOPROL-XL) 25 MG 24 hr tablet TAKE 1 TABLET BY MOUTH EVERY DAY 02/08/18   Viviana Simpler I, MD  omeprazole (PRILOSEC) 20 MG capsule TAKE 1 CAPSULE BY MOUTH EVERY DAY 08/20/18   Venia Carbon, MD  tiZANidine (ZANAFLEX) 2 MG tablet TAKE 1 TO 2 TABLETS BY MOUTH AT BEDTIME AS NEEDED FOR MUSCLE SPASMS 12/27/18   Venia Carbon, MD    Allergies Zithromax [azithromycin dihydrate]  Family History  Problem Relation Age of Onset  . Cancer Father        prostate  . Cancer Paternal Uncle        prostate cancer  . Colon cancer Neg Hx   . Rectal cancer Neg Hx   . Stomach cancer Neg Hx     Social History Social History   Tobacco Use  . Smoking status: Never Smoker  . Smokeless tobacco: Never Used  Substance Use Topics  . Alcohol use: No  . Drug use: No    Review of Systems  Constitutional: No fever/chills  Eyes: No visual changes. ENT: No sore throat. Respiratory: Denies cough Gastrointestinal: Positive for right lower quadrant pain  genitourinary: Negative for dysuria. Musculoskeletal: Negative for back pain. Skin: Negative for rash.    ____________________________________________   PHYSICAL EXAM:  VITAL SIGNS: ED Triage Vitals [01/01/19 1426]  Enc Vitals Group     BP (!) 156/84     Pulse Rate 62     Resp 16     Temp 99.1 F (37.3 C)     Temp Source Oral     SpO2 97 %     Weight 170 lb (77.1 kg)     Height 5\' 4"  (1.626 m)     Head Circumference      Peak Flow      Pain Score 10     Pain Loc      Pain Edu?      Excl. in [place]?     Constitutional: Alert and oriented. Well appearing and in no acute distress. Eyes:  Conjunctivae are normal.  Head: Atraumatic. Nose: No congestion/rhinnorhea. Mouth/Throat: Mucous membranes are moist.   Neck:  supple no lymphadenopathy noted Cardiovascular: Normal rate, regular rhythm. Heart sounds are normal Respiratory: Normal respiratory effort.  No retractions, lungs c t a  Abd: soft tender in the right lower quadrant, bs normal all 4 quad GU: deferred Musculoskeletal: FROM all extremities, warm and well perfused Neurologic:  Normal speech and language.  Skin:  Skin is warm, dry and intact. No rash noted. Psychiatric: Mood and affect are normal. Speech and behavior are normal.  ____________________________________________   LABS (all labs ordered are listed, but only abnormal results are displayed)  Labs Reviewed  COMPREHENSIVE METABOLIC PANEL - Abnormal; Notable for the following components:      Result Value   CO2 21 (*)    Glucose, Bld 167 (*)    All other components within normal limits  CBC - Abnormal; Notable for the following components:   WBC 10.8 (*)    All other components within normal limits  URINALYSIS, COMPLETE (UACMP) WITH MICROSCOPIC - Abnormal; Notable for the following components:   Color, Urine YELLOW (*)    APPearance CLEAR (*)    Glucose, UA 150 (*)    All other components within normal limits  LIPASE, BLOOD   ____________________________________________   ____________________________________________  RADIOLOGY  CT abdomen/pelvis with IV contrast shows gallstones but no cholecystitis Ultrasound of the right upper quadrant shows gallstones but no cholecystitis  ____________________________________________   PROCEDURES  Procedure(s) performed: Percocet 1 p.o.   Procedures    ____________________________________________   INITIAL IMPRESSION / ASSESSMENT AND PLAN / ED COURSE  Pertinent labs & imaging results that were available during my care of the patient were reviewed by me and considered in my medical decision  making (see chart for details).   Patient 73 year old male presents emergency department with concerns of right lower quadrant pain.  See HPI  Physical exam shows right lower quadrant to be tender to palpation.  Remainder of exam is unremarkable  Explained findings to the patient.  Labs were ordered from triage.   CBC has elevated WBC of 10.8, urinalysis is normal except for the glucose of 150, comprehensive metabolic panel is normal except for his elevated glucose of 167, lipase is normal  DDx: Acute appendicitis, hernia, kidney stone  CT abdomen/pelvis with IV contrast is negative other than gallstones without cholecystitis Ultrasound is negative  Explained the findings to the patient.  He is to follow-up with his regular  doctor to discuss whether he should have any elective surgery to have his gallbladder removed.  He is to return to the emergency department if his abdominal pain is worsening.  He states he understands will comply.  He was discharged in stable condition.    Bobby Woods was evaluated in Emergency Department on 01/01/2019 for the symptoms described in the history of present illness. He was evaluated in the context of the global COVID-19 pandemic, which necessitated consideration that the patient might be at risk for infection with the SARS-CoV-2 virus that causes COVID-19. Institutional protocols and algorithms that pertain to the evaluation of patients at risk for COVID-19 are in a state of rapid change based on information released by regulatory bodies including the CDC and federal and state organizations. These policies and algorithms were followed during the patient's care in the ED.   As part of my medical decision making, I reviewed the following data within the [place] notes reviewed and incorporated, Labs reviewed , Old chart reviewed, Radiograph reviewed , Notes from prior ED visits and [place] Controlled Substance  Database  ____________________________________________   FINAL CLINICAL IMPRESSION(S) / ED DIAGNOSES  Final diagnoses:  RUQ pain  Right upper quadrant abdominal pain      NEW MEDICATIONS STARTED DURING THIS VISIT:  New Prescriptions   No medications on file     Note:  This document was prepared using Dragon voice recognition software and may include unintentional dictation errors.    Versie Starks, PA-C 01/01/19 [place]ters    Carrie Mew, MD 01/01/19 2350

## 2019-01-01 NOTE — Discharge Instructions (Addendum)
Follow-up with your regular doctor if not better in 3 days.  Return emergency department if worsening abdominal pain.  Your test today are all negative.  You do have gallstones but there are no concerns that your gallbladder needs to come out immediately.  You can follow-up with a surgeon for a evaluation to see if you need elective surgery to have her gallbladder removed.

## 2019-01-01 NOTE — ED Triage Notes (Signed)
Pt started last night with RLQ abdominal pain.  + nausea. No vomiting.  Describes 10/10 and nothing relieves it.  No diarrhea or known fever.  Ambulatory. VSS at this time.

## 2019-01-01 NOTE — ED Notes (Signed)
Jonni Sanger Medic to chart vitals soon and then pt to be d/c per Charlean Sanfilippo.

## 2019-01-03 ENCOUNTER — Encounter: Payer: Self-pay | Admitting: Internal Medicine

## 2019-01-03 ENCOUNTER — Ambulatory Visit (INDEPENDENT_AMBULATORY_CARE_PROVIDER_SITE_OTHER): Payer: Medicare Other | Admitting: Internal Medicine

## 2019-01-03 ENCOUNTER — Other Ambulatory Visit: Payer: Self-pay

## 2019-01-03 DIAGNOSIS — R1011 Right upper quadrant pain: Secondary | ICD-10-CM | POA: Diagnosis not present

## 2019-01-03 NOTE — Progress Notes (Signed)
   Subjective: Patient presents today for follow up evaluation of an ingrown nail of the medial and lateral borders of the right great toe as well as capsulitis of the toe. He reports continued aching pain. He states the ingrown nail is doing better. He has been taking Meloxicam as directed. Patient is here for further evaluation and treatment.   Past Medical History:  Diagnosis Date  . Arthritis   . Blood transfusion without reported diagnosis    age 73 year   . BPH (benign prostatic hypertrophy)   . GERD (gastroesophageal reflux disease)   . Hypertension    on medicines     Objective:  General: Well developed, nourished, in no acute distress, alert and oriented x3   Dermatology: Skin is warm, dry and supple bilateral. Medial and lateral borders of the right great toe appears to be erythematous with evidence of an ingrowing nail. Pain on palpation noted to the border of the nail fold. The remaining nails appear unremarkable at this time. There are no open sores, lesions.  Vascular: Dorsalis Pedis artery and Posterior Tibial artery pedal pulses palpable. No lower extremity edema noted.   Neruologic: Grossly intact via light touch bilateral.  Musculoskeletal: Pain with palpation noted to the right great toe. Muscular strength within normal limits in all groups bilateral. Normal range of motion noted to all pedal and ankle joints.   Assesement: #1 Paronychia with ingrowing nail medial and lateral borders right great toe  #2 Pain in toe #3 Incurvated nail #4 Right great toe capsulitis - improved   Plan of Care:  1. Patient evaluated.  2. Mechanical debridement of the right great toenail performed using a nail nipper. Filed with dremel without incident.  3. Continue taking Meloxicam as needed.  4. Return to clinic as needed.   Renaldo Fiddler, daughter, is a patient.    Edrick Kins, DPM [facility]  Dr. Edrick Kins, [address]                                         [place], [place] [address]                Office [phone]  Fax [phone]

## 2019-01-03 NOTE — Progress Notes (Signed)
Subjective:    Patient ID: Bobby Woods, male    DOB: [birth year], 73 y.o.   MRN: [id number]  HPI Here for ER follow up  This visit occurred during the SARS-CoV-2 public health emergency.  Safety protocols were in place, including screening questions prior to the visit, additional usage of staff PPE, and extensive cleaning of exam room while observing appropriate contact time as indicated for disinfecting solutions.   Had some back pain and into right groin--started in October When it got worse 3 days ago at night---and the next morning It was so bad he went to ER Now with numbness along right inguinal area and in right flank area (CVA) Awoke with it severe  Reviewed ultrasound and CT scan Got pain pills in the ER  Prior pain not related to eating  Current Outpatient Medications on File Prior to Visit  Medication Sig Dispense Refill  . amLODipine (NORVASC) 5 MG tablet Take 1 tablet (5 mg total) by mouth daily. 90 tablet 3  . aspirin 81 MG tablet Take 81 mg by mouth every other day.     . finasteride (PROSCAR) 5 MG tablet TAKE 1 TABLET BY MOUTH EVERY DAY 90 tablet 3  . meloxicam (MOBIC) 15 MG tablet Take 1 tablet (15 mg total) by mouth daily. 30 tablet 1  . metoprolol succinate (TOPROL-XL) 25 MG 24 hr tablet TAKE 1 TABLET BY MOUTH EVERY DAY 90 tablet 3  . omeprazole (PRILOSEC) 20 MG capsule TAKE 1 CAPSULE BY MOUTH EVERY DAY 90 capsule 3  . tiZANidine (ZANAFLEX) 2 MG tablet TAKE 1 TO 2 TABLETS BY MOUTH AT BEDTIME AS NEEDED FOR MUSCLE SPASMS 180 tablet 1   No current facility-administered medications on file prior to visit.    Allergies  Allergen Reactions  . Zithromax [Azithromycin Dihydrate]     GI upset    Past Medical History:  Diagnosis Date  . Arthritis   . Blood transfusion without reported diagnosis    age 45 year   . BPH (benign prostatic hypertrophy)   . GERD (gastroesophageal reflux disease)   . Hypertension    on medicines     Past Surgical History:    Procedure Laterality Date  . COLONOSCOPY  2006  . HAND SURGERY    . [place]  . PROSTATE BIOPSY  2011   negative  . [place]   left, right 05/07  . ROTATOR CUFF REPAIR  10/09   right  . TONSILLECTOMY  1972    Family History  Problem Relation Age of Onset  . Cancer Father        prostate  . Cancer Paternal Uncle        prostate cancer  . Colon cancer Neg Hx   . Rectal cancer Neg Hx   . Stomach cancer Neg Hx     Social History   Socioeconomic History  . Marital status: Married    Spouse name: Not on file  . Number of children: 3  . Years of education: Not on file  . Highest education level: Not on file  Occupational History  . Occupation: Psychologist, prison and probation services   Retired 8/13    Employer: [employer]  . Occupation: Quality assurance--cleaning    Employer: [employer]    Comment:    . Occupation: Custodial/funeral assistance    Comment: [place]  Tobacco Use  . Smoking status: Never Smoker  . Smokeless tobacco: Never Used  Substance  and Sexual Activity  . Alcohol use: No  . Drug use: No  . Sexual activity: Not on file  Other Topics Concern  . Not on file  Social History Narrative   Has living will   Wife has [employer] health care POA and alternate is [name] (cousin)   Would accept attempts at resuscitation but no prolonged artificial ventilation   Not sure about a feeding tube   Social Determinants of Health   Financial Resource Strain:   . Difficulty of Paying Living Expenses: Not on file  Food Insecurity:   . Worried About Charity fundraiser in the Last Year: Not on file  . Ran Out of Food in the Last Year: Not on file  Transportation Needs:   . Lack of Transportation (Medical): Not on file  . Lack of Transportation (Non-Medical): Not on file  Physical Activity:   . Days of Exercise per Week: Not on file  . Minutes of Exercise per Session: Not on file  Stress:   . Feeling of Stress : Not on file   Social Connections:   . Frequency of Communication with Friends and Family: Not on file  . Frequency of Social Gatherings with Friends and Family: Not on file  . Attends Religious Services: Not on file  . Active Member of Clubs or Organizations: Not on file  . Attends Archivist Meetings: Not on file  . Marital Status: Not on file  Intimate Partner Violence:   . Fear of Current or Ex-Partner: Not on file  . Emotionally Abused: Not on file  . Physically Abused: Not on file  . Sexually Abused: Not on file   Review of Systems No hematuria or dysuria No fever Still eating normally Did have nausea with the severe pain Bowels are fine for the most part No cough or SOB    Objective:   Physical Exam  Constitutional: He appears well-developed. No distress.  Neck: No thyromegaly present.  Cardiovascular: Normal rate, regular rhythm and normal heart sounds. Exam reveals no gallop.  No murmur heard. Respiratory: Effort normal and breath sounds normal. No respiratory distress. He has no wheezes. He has no rales.  GI: Soft. Bowel sounds are normal. He exhibits no distension and no mass. There is no rebound and no guarding.  Mild RUQ tenderness  Musculoskeletal:        General: No edema.  Lymphadenopathy:    He has no cervical adenopathy.           Assessment & Plan:

## 2019-01-03 NOTE — Assessment & Plan Note (Signed)
Festering right flank and groin pain that suddenly got severe Only tender in RUQ History not that suggestive of gallstone pain--but that is the only finding on CT and ultrasound and he is tender there (though no cholecystitis). Will set up with general surgery to see if surgery would be indicated

## 2019-01-04 ENCOUNTER — Ambulatory Visit: Payer: Medicare Other | Admitting: Internal Medicine

## 2019-02-17 ENCOUNTER — Other Ambulatory Visit: Payer: Self-pay | Admitting: Internal Medicine

## 2019-02-18 ENCOUNTER — Other Ambulatory Visit: Payer: Self-pay

## 2019-02-18 ENCOUNTER — Ambulatory Visit (INDEPENDENT_AMBULATORY_CARE_PROVIDER_SITE_OTHER): Payer: Medicare PPO | Admitting: Podiatry

## 2019-02-18 ENCOUNTER — Ambulatory Visit (INDEPENDENT_AMBULATORY_CARE_PROVIDER_SITE_OTHER): Payer: Medicare PPO

## 2019-02-18 DIAGNOSIS — M79672 Pain in left foot: Secondary | ICD-10-CM

## 2019-02-18 DIAGNOSIS — M722 Plantar fascial fibromatosis: Secondary | ICD-10-CM

## 2019-02-18 DIAGNOSIS — L600 Ingrowing nail: Secondary | ICD-10-CM | POA: Diagnosis not present

## 2019-02-18 MED ORDER — MELOXICAM 15 MG PO TABS: 15.0000 mg | ORAL_TABLET | Freq: Every day | ORAL | 1 refills | Status: DC

## 2019-02-18 MED ORDER — GENTAMICIN SULFATE 0.1 % EX CREA: 1.0000 "application " | TOPICAL_CREAM | Freq: Two times a day (BID) | CUTANEOUS | 1 refills | Status: DC

## 2019-02-21 NOTE — Progress Notes (Signed)
   Subjective: Patient presents today for evaluation of pain to the medial border of the medial border of the right great toe that began about one week ago. Patient is concerned for possible ingrown nail. She also reports dull aching pain of the left lateral foot and heel. Walking for long periods of time increases the pain. She has had injections in the past and has taken oral pain medication for treatment of the left foot pain. She has not done anything for the right hallux pain. Patient presents today for further treatment and evaluation.  Past Medical History:  Diagnosis Date  . Arthritis   . Blood transfusion without reported diagnosis    age 74 year   . BPH (benign prostatic hypertrophy)   . GERD (gastroesophageal reflux disease)   . Hypertension    on medicines     Objective:  General: Well developed, nourished, in no acute distress, alert and oriented x3   Dermatology: Skin is warm, dry and supple bilateral. Medial border of the right great toe appears to be erythematous with evidence of an ingrowing nail. Pain on palpation noted to the border of the nail fold. The remaining nails appear unremarkable at this time. There are no open sores, lesions.  Vascular: Dorsalis Pedis artery and Posterior Tibial artery pedal pulses palpable. No lower extremity edema noted.   Neruologic: Grossly intact via light touch bilateral.  Musculoskeletal: Pain with palpation noted to the left heel along the plantar fascia. Muscular strength within normal limits in all groups bilateral. Normal range of motion noted to all pedal and ankle joints.   Radiographic Exam:  Normal osseous mineralization. Joint spaces preserved. No fracture/dislocation/boney destruction.    Assesement: #1 Paronychia with ingrowing nail medial border right great toe  #2 Pain in toe #3 Incurvated nail #4 Plantar fasciitis left   Plan of Care:  1. Patient evaluated. X-Rays reviewed.  2. Discussed treatment alternatives and  plan of care. Explained nail avulsion procedure and post procedure course to patient. 3. Patient opted for permanent partial nail avulsion of the medial border of the right great toe.  4. Prior to procedure, local anesthesia infiltration utilized using 3 ml of a 50:50 mixture of 2% plain lidocaine and 0.5% plain marcaine in a normal hallux block fashion and a betadine prep performed.  5. Partial permanent nail avulsion with chemical matrixectomy performed using [id number] applications of phenol followed by alcohol flush.  6. Light dressing applied. 7. Prescription for Gentamicin cream provided to patient to use daily with a bandage.  8. Injection of 0.5 mLs Celestone Soluspan injected into the left heel.  9. Plantar fascial brace dispensed.  10. Refill prescription for Meloxicam provided to patient.  11. Return to clinic in 3 weeks.  Edrick Kins, DPM [facility]  Dr. Edrick Kins, [place]                                        [place], [place] [address]                Office [phone]  Fax [phone]

## 2019-03-07 ENCOUNTER — Telehealth: Payer: Self-pay | Admitting: Internal Medicine

## 2019-03-07 DIAGNOSIS — G8929 Other chronic pain: Secondary | ICD-10-CM

## 2019-03-07 NOTE — Telephone Encounter (Signed)
Spoke to pt's wife per DPR. He did go to the Psychologist, sport and exercise. Was not a gallbladder issue. He will call me back tomorrow.

## 2019-03-07 NOTE — Telephone Encounter (Signed)
Patient stated that he was seen in the office for right side pain. He stated he is still having numbness. Not really having any pain, he stated it was just an aggravation . Patient said at his visit it was discuss that he may need a MRI if it continues. He would like to know if this could be set up for him since he is still having trouble with the right side

## 2019-03-07 NOTE — Telephone Encounter (Signed)
The last note does not mention anything about MRI. Did he ever see the general surgeon?

## 2019-03-10 NOTE — Telephone Encounter (Signed)
Pt called back. Surgeon told him that he may need an MRI to look in the groin area or for the numbness and pain he is having. Advised him I would get with Dr Silvio Pate tomorrow and he will advise whether he needs an OV to re-evaluate or order scans.

## 2019-03-11 ENCOUNTER — Telehealth: Payer: Self-pay | Admitting: Internal Medicine

## 2019-03-11 ENCOUNTER — Ambulatory Visit: Payer: Medicare PPO | Admitting: Podiatry

## 2019-03-11 NOTE — Telephone Encounter (Signed)
error 

## 2019-03-11 NOTE — Telephone Encounter (Signed)
Spoke to pt. He would like to [facility].

## 2019-03-11 NOTE — Telephone Encounter (Signed)
Pt returned your call best number [phone]

## 2019-03-11 NOTE — Telephone Encounter (Signed)
I think it would be better for him to see a specialist in physical medicine (physiatrist). This could be in [place] or at [place] out if he is willing to do this

## 2019-03-11 NOTE — Telephone Encounter (Signed)
Spoke to pt's wife. She will have him call me back.

## 2019-03-12 NOTE — Addendum Note (Signed)
Addended by: Viviana Simpler I on: 03/12/2019 07:58 AM   Modules accepted: Orders

## 2019-03-12 NOTE — Telephone Encounter (Signed)
Referral done

## 2019-03-15 ENCOUNTER — Other Ambulatory Visit: Payer: Self-pay

## 2019-03-15 ENCOUNTER — Ambulatory Visit: Payer: Medicare PPO | Admitting: Podiatry

## 2019-03-15 ENCOUNTER — Encounter: Payer: Self-pay | Admitting: Podiatry

## 2019-03-15 DIAGNOSIS — M722 Plantar fascial fibromatosis: Secondary | ICD-10-CM

## 2019-03-15 DIAGNOSIS — L600 Ingrowing nail: Secondary | ICD-10-CM

## 2019-03-17 NOTE — Progress Notes (Signed)
   Subjective: 74 y.o. male presents today status post permanent nail avulsion procedure of the medial border of the right hallux that was performed on 02/18/2019. He is also here for follow up evaluation of plantar fasciitis of the left foot. He states both areas have improved. He has been taking the Meloxicam daily which helps ease his pain. Being on the foot for long periods of time increases the pain. Patient is here for further evaluation and treatment.    Past Medical History:  Diagnosis Date  . Arthritis   . Blood transfusion without reported diagnosis    age 74 year   . BPH (benign prostatic hypertrophy)   . GERD (gastroesophageal reflux disease)   . Hypertension    on medicines     Objective: Skin is warm, dry and supple. Nail and respective nail fold appears to be healing appropriately. Open wound to the associated nail fold with a granular wound base and moderate amount of fibrotic tissue. Minimal drainage noted. Mild erythema around the periungual region likely due to phenol chemical matricectomy. Tenderness to palpation to the plantar aspect of the left heel along the plantar fascia. All other joints range of motion within normal limits bilateral. Strength 5/5 in all groups bilateral.   Assessment: #1 postop permanent partial nail avulsion medial border right hallux #2 open wound periungual nail fold of respective digit.  #3 Plantar fasciitis left   Plan of care: #1 patient was evaluated  #2 debridement of open wound was performed to the periungual border of the respective toe using a currette. Antibiotic ointment and Band-Aid was applied. #3 Injection of 0.5 mLs Celestone Soluspan injected into the left heel.  #4 Continue using plantar fascial brace.  #5 Continue taking Meloxicam daily.  #6 patient is to return to clinic on a PRN basis.   Edrick Kins, DPM [facility]  Dr. Edrick Kins, [place]                                         [place], [place] [address]                Office [phone]  Fax [phone]

## 2019-03-24 ENCOUNTER — Other Ambulatory Visit: Payer: Self-pay | Admitting: Physical Medicine and Rehabilitation

## 2019-03-24 DIAGNOSIS — M5416 Radiculopathy, lumbar region: Secondary | ICD-10-CM

## 2019-03-24 DIAGNOSIS — M5441 Lumbago with sciatica, right side: Secondary | ICD-10-CM | POA: Diagnosis not present

## 2019-03-24 DIAGNOSIS — G8929 Other chronic pain: Secondary | ICD-10-CM | POA: Diagnosis not present

## 2019-04-05 ENCOUNTER — Other Ambulatory Visit: Payer: Self-pay

## 2019-04-05 ENCOUNTER — Ambulatory Visit
Admission: RE | Admit: 2019-04-05 | Discharge: 2019-04-05 | Disposition: A | Discharge status: Home / Self Care | Payer: Medicare PPO | Source: Ambulatory Visit | Attending: Physical Medicine and Rehabilitation | Admitting: Physical Medicine and Rehabilitation

## 2019-04-05 DIAGNOSIS — M5416 Radiculopathy, lumbar region: Secondary | ICD-10-CM | POA: Insufficient documentation

## 2019-04-05 DIAGNOSIS — M545 Low back pain: Secondary | ICD-10-CM | POA: Diagnosis not present

## 2019-04-08 DIAGNOSIS — G8929 Other chronic pain: Secondary | ICD-10-CM | POA: Diagnosis not present

## 2019-04-08 DIAGNOSIS — M5441 Lumbago with sciatica, right side: Secondary | ICD-10-CM | POA: Diagnosis not present

## 2019-04-08 DIAGNOSIS — M48061 Spinal stenosis, lumbar region without neurogenic claudication: Secondary | ICD-10-CM | POA: Diagnosis not present

## 2019-04-20 DIAGNOSIS — M5441 Lumbago with sciatica, right side: Secondary | ICD-10-CM | POA: Diagnosis not present

## 2019-04-20 DIAGNOSIS — G8929 Other chronic pain: Secondary | ICD-10-CM | POA: Diagnosis not present

## 2019-05-04 DIAGNOSIS — G8929 Other chronic pain: Secondary | ICD-10-CM | POA: Diagnosis not present

## 2019-05-04 DIAGNOSIS — M48061 Spinal stenosis, lumbar region without neurogenic claudication: Secondary | ICD-10-CM | POA: Diagnosis not present

## 2019-05-04 DIAGNOSIS — M5441 Lumbago with sciatica, right side: Secondary | ICD-10-CM | POA: Diagnosis not present

## 2019-05-20 ENCOUNTER — Other Ambulatory Visit: Payer: Self-pay

## 2019-05-20 ENCOUNTER — Encounter: Payer: Self-pay | Admitting: Podiatry

## 2019-05-20 ENCOUNTER — Ambulatory Visit: Payer: Medicare PPO | Admitting: Podiatry

## 2019-05-20 DIAGNOSIS — M722 Plantar fascial fibromatosis: Secondary | ICD-10-CM | POA: Diagnosis not present

## 2019-05-25 NOTE — Progress Notes (Signed)
   Subjective: 74 y.o. male presenting today for follow up evaluation of plantar fasciitis of the left foot. He reports continued pain of the left heel. He has gotten injections in the past for treatment which provide some temporary relief. He has been taking Meloxicam for treatment. Walking and being on the foot for long periods of time increases the pain. Patient is here for further evaluation and treatment.   Past Medical History:  Diagnosis Date  . Arthritis   . Blood transfusion without reported diagnosis    age 60 year   . BPH (benign prostatic hypertrophy)   . GERD (gastroesophageal reflux disease)   . Hypertension    on medicines      Objective: Physical Exam General: The patient is alert and oriented x3 in no acute distress.  Dermatology: Skin is warm, dry and supple bilateral lower extremities. Negative for open lesions or macerations bilateral.   Vascular: Dorsalis Pedis and Posterior Tibial pulses palpable bilateral.  Capillary fill time is immediate to all digits.  Neurological: Epicritic and protective threshold intact bilateral.   Musculoskeletal: Tenderness to palpation to the plantar aspect of the left heel along the plantar fascia. All other joints range of motion within normal limits bilateral. Strength 5/5 in all groups bilateral.   Assessment: 1. Plantar fasciitis left foot  Plan of Care:  1. Patient evaluated.    2. Declined injection.  3. Powerstep insoles provided.  4. Continue taking Meloxicam.  5. Recommended new shoes from [employer] in [place].  6. Return to clinic as needed.     Edrick Kins, DPM [facility]  Dr. Edrick Kins, DPM    [address], [place] [address]                Office [phone]  Fax [phone]

## 2019-06-01 DIAGNOSIS — G8929 Other chronic pain: Secondary | ICD-10-CM | POA: Diagnosis not present

## 2019-06-01 DIAGNOSIS — M5441 Lumbago with sciatica, right side: Secondary | ICD-10-CM | POA: Diagnosis not present

## 2019-06-15 ENCOUNTER — Telehealth: Payer: Self-pay | Admitting: *Deleted

## 2019-06-15 DIAGNOSIS — M48061 Spinal stenosis, lumbar region without neurogenic claudication: Secondary | ICD-10-CM | POA: Diagnosis not present

## 2019-06-15 DIAGNOSIS — M5441 Lumbago with sciatica, right side: Secondary | ICD-10-CM | POA: Diagnosis not present

## 2019-06-15 DIAGNOSIS — G8929 Other chronic pain: Secondary | ICD-10-CM | POA: Diagnosis not present
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
## 2019-06-15 NOTE — Telephone Encounter (Signed)
It certainly seems reasonable to wait for tomorrow for his appointment

## 2019-06-15 NOTE — Telephone Encounter (Signed)
Patient called and spoke to Access Nurse and was transferred back to the office. Patient stated that he was told by Access Nurse that he needed to be seen within 4 hours. Patient stated that he started with heart palpitations and dizziness off and on last week. Patient stated that he is still having palpitations, but no longer having dizziness.Patient stated that the heart palpitations have not gotten any worse and about the same. Patient stated that he had to go for a follow-up with his orthopedist today from having back injections two weeks ago. Patient stated that his blood pressure today was 150/93. Patient denies any chest pain, SOB, or any other symptoms. Patent stated that he use to be on Atenolol and that was changed to Metoprolol back when  he couldn't get the Atenolol any longer. Patient stated that the Metoprolol has been working fine for him until now. Patient was scheduled for an appointment with Dr. Silvio Pate tomorrow 06/16/19 at 1:45 pm. Patient was advised to rest today and to continue to monitor his blood pressure. Patient was given ER precautions and he verbalized understanding.

## 2019-06-16 ENCOUNTER — Encounter: Payer: Self-pay | Admitting: Internal Medicine

## 2019-06-16 ENCOUNTER — Ambulatory Visit: Payer: Medicare PPO | Admitting: Internal Medicine

## 2019-06-16 ENCOUNTER — Other Ambulatory Visit: Payer: Self-pay

## 2019-06-16 VITALS — BP 112/84 | HR 71 | Temp 97.6°F | Ht 64.5 in | Wt 177.0 lb

## 2019-06-16 DIAGNOSIS — I10 Essential (primary) hypertension: Secondary | ICD-10-CM

## 2019-06-16 DIAGNOSIS — R002 Palpitations: Secondary | ICD-10-CM | POA: Insufficient documentation

## 2019-06-16 LAB — CBC
HCT: 42.9 % (ref 39.0–52.0)
Hemoglobin: 15.1 g/dL (ref 13.0–17.0)
MCHC: 35.1 g/dL (ref 30.0–36.0)
MCV: 92.6 fl (ref 78.0–100.0)
Platelets: 256 10*3/uL (ref 150.0–400.0)
RBC: 4.63 Mil/uL (ref 4.22–5.81)
RDW: 13 % (ref 11.5–15.5)
WBC: 10.3 10*3/uL (ref 4.0–10.5)

## 2019-06-16 LAB — COMPREHENSIVE METABOLIC PANEL
ALT: 19 U/L (ref 0–53)
AST: 22 U/L (ref 0–37)
Albumin: 4.3 g/dL (ref 3.5–5.2)
Alkaline Phosphatase: 55 U/L (ref 39–117)
BUN: 20 mg/dL (ref 6–23)
CO2: 28 mEq/L (ref 19–32)
Calcium: 9.7 mg/dL (ref 8.4–10.5)
Chloride: 103 mEq/L (ref 96–112)
Creatinine, Ser: 1.15 mg/dL (ref 0.40–1.50)
GFR: 62.23 mL/min (ref >60.00)
Glucose, Bld: 93 mg/dL (ref 70–99)
Potassium: 4.4 mEq/L (ref 3.5–5.1)
Sodium: 138 mEq/L (ref 135–145)
Total Bilirubin: 1.3 mg/dL — ABNORMAL HIGH (ref 0.2–1.2)
Total Protein: 6.7 g/dL (ref 6.0–8.3)

## 2019-06-16 LAB — T4, FREE: Free T4: 0.77 ng/dL (ref 0.60–1.60)

## 2019-06-16 NOTE — Assessment & Plan Note (Signed)
BP Readings from Last 3 Encounters:  06/16/19 112/84  01/03/19 114/76  01/01/19 (!) 156/86   Repeat 130/86 on left (and he got 140/92 with his machine after) I am not concerned about his BP at this point on amlodipine and metoprolol He is using the meloxicam daily and that can influence it also

## 2019-06-16 NOTE — Assessment & Plan Note (Addendum)
Has had in the past but none since the metoprolol--till now Nothing particularly worrisome for coronary ischemia Will check EKG and labs  EKG shows sinus brady at 52, ?slight LAD No hypertrophy or ischemia No change since 07/07/16  Will send to cardiology again Consider zio monitor May want to get echocardiogram Continue the metoprolol--no change due to the bradycardia

## 2019-06-16 NOTE — Progress Notes (Signed)
Subjective:    Patient ID: Bobby Woods, male    DOB: 10/25/1945, 74 y.o.   MRN: [id number]  HPI Here due to palpitations This visit occurred during the SARS-CoV-2 public health emergency.  Safety protocols were in place, including screening questions prior to the visit, additional usage of staff PPE, and extensive cleaning of exam room while observing appropriate contact time as indicated for disinfecting solutions.   Last week, he noticed his heart "fluttering and jumping" Had some dizziness at work around this time Settled down some but then recurred 2 days ago Wife noticed skips when she checked his pulse He has been consistent with his metoprolol  BP at home 148/89-160/100  No racing heart Has some soreness substernal No SOB Slight dizziness today No edema  Current Outpatient Medications on File Prior to Visit  Medication Sig Dispense Refill  . amLODipine (NORVASC) 5 MG tablet Take 1 tablet (5 mg total) by mouth daily. 90 tablet 3  . aspirin 81 MG tablet Take 81 mg by mouth every other day.     . finasteride (PROSCAR) 5 MG tablet TAKE 1 TABLET BY MOUTH EVERY DAY 90 tablet 3  . meloxicam (MOBIC) 15 MG tablet Take 1 tablet (15 mg total) by mouth daily. 30 tablet 1  . metoprolol succinate (TOPROL-XL) 25 MG 24 hr tablet TAKE 1 TABLET BY MOUTH EVERY DAY 90 tablet 3  . omeprazole (PRILOSEC) 20 MG capsule TAKE 1 CAPSULE BY MOUTH EVERY DAY 90 capsule 3  . tiZANidine (ZANAFLEX) 2 MG tablet TAKE 1 TO 2 TABLETS BY MOUTH AT BEDTIME AS NEEDED FOR MUSCLE SPASMS 180 tablet 1   No current facility-administered medications on file prior to visit.    Allergies  Allergen Reactions  . Zithromax [Azithromycin Dihydrate]     GI upset    Past Medical History:  Diagnosis Date  . Arthritis   . Blood transfusion without reported diagnosis    age 65 year   . BPH (benign prostatic hypertrophy)   . GERD (gastroesophageal reflux disease)   . Hypertension    on medicines     Past  Surgical History:  Procedure Laterality Date  . COLONOSCOPY  2006  . HAND SURGERY    . [place]  . PROSTATE BIOPSY  2011   negative  . [place]   left, right 05/07  . ROTATOR CUFF REPAIR  10/09   right  . TONSILLECTOMY  1972    Family History  Problem Relation Age of Onset  . Cancer Father        prostate  . Cancer Paternal Uncle        prostate cancer  . Colon cancer Neg Hx   . Rectal cancer Neg Hx   . Stomach cancer Neg Hx     Social History   Socioeconomic History  . Marital status: Married    Spouse name: Not on file  . Number of children: 3  . Years of education: Not on file  . Highest education level: Not on file  Occupational History  . Occupation: Psychologist, prison and probation services   Retired 8/13    Employer: [employer]  . Occupation: Quality assurance--cleaning    Employer: [employer]    Comment:    . Occupation: Custodial/funeral assistance    Comment: [employer]  Tobacco Use  . Smoking status: Never Smoker  . Smokeless tobacco: Never Used  Substance and Sexual Activity  . Alcohol use: No  .  Drug use: No  . Sexual activity: Not on file  Other Topics Concern  . Not on file  Social History Narrative   Has living will   Wife has [place] health care POA and alternate is [name] (cousin)   Would accept attempts at resuscitation but no prolonged artificial ventilation   Not sure about a feeding tube   Social Determinants of Health   Financial Resource Strain:   . Difficulty of Paying Living Expenses:   Food Insecurity:   . Worried About Charity fundraiser in the Last Year:   . Arboriculturist in the Last Year:   Transportation Needs:   . Film/video editor (Medical):   Marland Kitchen Lack of Transportation (Non-Medical):   Physical Activity:   . Days of Exercise per Week:   . Minutes of Exercise per Session:   Stress:   . Feeling of Stress :   Social Connections:   . Frequency of Communication with Friends  and Family:   . Frequency of Social Gatherings with Friends and Family:   . Attends Religious Services:   . Active Member of Clubs or Organizations:   . Attends Archivist Meetings:   Marland Kitchen Marital Status:   Intimate Partner Violence:   . Fear of Current or Ex-Partner:   . Emotionally Abused:   Marland Kitchen Physically Abused:   . Sexually Abused:    Review of Systems Has had 2 epidural steroid injections--has noticed some elevated BP after these Not much improvement (last 2 weeks) Very limited caffeine--1 cup coffee in the morning at most    Objective:   Physical Exam  Constitutional: He appears well-developed. No distress.  Cardiovascular: Normal rate, regular rhythm and normal heart sounds. Exam reveals no gallop.  No murmur heard. Respiratory: Effort normal and breath sounds normal. No respiratory distress. He has no wheezes. He has no rales.  Musculoskeletal:        General: No edema.  Psychiatric: He has a normal mood and affect. His behavior is normal.           Assessment & Plan:

## 2019-06-20 ENCOUNTER — Ambulatory Visit (INDEPENDENT_AMBULATORY_CARE_PROVIDER_SITE_OTHER): Payer: Medicare PPO

## 2019-06-20 ENCOUNTER — Ambulatory Visit: Payer: Medicare PPO | Admitting: Cardiology

## 2019-06-20 ENCOUNTER — Other Ambulatory Visit: Payer: Self-pay

## 2019-06-20 ENCOUNTER — Encounter: Payer: Self-pay | Admitting: Cardiology

## 2019-06-20 VITALS — BP 116/68 | HR 52 | Ht 64.0 in | Wt 176.5 lb

## 2019-06-20 DIAGNOSIS — R0600 Dyspnea, unspecified: Secondary | ICD-10-CM

## 2019-06-20 DIAGNOSIS — I499 Cardiac arrhythmia, unspecified: Secondary | ICD-10-CM | POA: Diagnosis not present

## 2019-06-20 DIAGNOSIS — I10 Essential (primary) hypertension: Secondary | ICD-10-CM

## 2019-06-20 DIAGNOSIS — R0609 Other forms of dyspnea: Secondary | ICD-10-CM

## 2019-06-20 NOTE — Progress Notes (Signed)
Cardiology Office Note:    Date:  06/20/2019   ID:  Bobby Woods, DOB 05-19-1945, MRN [id number]  PCP:  Venia Carbon, MD  [facility] Cardiologist:  [name], MD  [place] Electrophysiologist:  None   Referring MD: Venia Carbon, MD   Chief Complaint  Patient presents with  . OTHER    Palpitations c/o chest pain, raspy voice and sob. Meds reviewed verbally with pt.    History of Present Illness:    Bobby Woods is a 74 y.o. male with a hx of GERD, hypertension who presents due to palpitations.  Patient states having symptoms of irregular heartbeats over the past 2 weeks.  He denies palpitations or fast heart rates but notes his heart rate has been irregular.  Symptoms of irregular heartbeats/heart fluttering last about a minute also.  Denies dizziness, presyncope or syncope.  He was diagnosed with palpitations back in 2001, he was originally placed on atenolol with improvement in his symptoms and later switched to metoprolol which he currently takes.  The metoprolol helped his symptoms of palpitations/irregular heartbeats until 2 weeks ago. 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  He has noticed shortness of breath whenever he sweeps or exerts himself over the past week.  He notes none specific chest discomfort which he describes as soreness on his breastbone.  Pressing on his chest makes the soreness worse.  Past Medical History:  Diagnosis Date  . Arthritis   . Blood transfusion without reported diagnosis    age 26 year   . BPH (benign prostatic hypertrophy)   . Chronic kidney disease   . GERD (gastroesophageal reflux disease)   . Hypertension    on medicines     Past Surgical History:  Procedure Laterality Date  . COLONOSCOPY  2006  . HAND SURGERY    . [place]  . PROSTATE BIOPSY  2011   negative  . [place]   left, right 05/07  . ROTATOR CUFF REPAIR  10/09   right  .  TONSILLECTOMY  1972    Current Medications: Current Meds  Medication Sig  . amLODipine (NORVASC) 5 MG tablet Take 1 tablet (5 mg total) by mouth daily.  Marland Kitchen aspirin 81 MG tablet Take 81 mg by mouth every other day.   . finasteride (PROSCAR) 5 MG tablet TAKE 1 TABLET BY MOUTH EVERY DAY  . meloxicam (MOBIC) 15 MG tablet Take 1 tablet (15 mg total) by mouth daily.  . metoprolol succinate (TOPROL-XL) 25 MG 24 hr tablet TAKE 1 TABLET BY MOUTH EVERY DAY  . omeprazole (PRILOSEC) 20 MG capsule TAKE 1 CAPSULE BY MOUTH EVERY DAY  . tiZANidine (ZANAFLEX) 2 MG tablet TAKE 1 TO 2 TABLETS BY MOUTH AT BEDTIME AS NEEDED FOR MUSCLE SPASMS     Allergies:   Zithromax [azithromycin dihydrate]   Social History   Socioeconomic History  . Marital status: Married    Spouse name: Not on file  . Number of children: 3  . Years of education: Not on file  . Highest education level: Not on file  Occupational History  . Occupation: Psychologist, prison and probation services   Retired 8/13    Employer: [employer]  . Occupation: Quality assurance--cleaning    Employer: [employer]    Comment:    . Occupation: Custodial/funeral assistance    Comment: [place]  Tobacco Use  . Smoking status: Never Smoker  .  Smokeless tobacco: Never Used  Substance and Sexual Activity  . Alcohol use: No  . Drug use: No  . Sexual activity: Not on file  Other Topics Concern  . Not on file  Social History Narrative   Has living will   Wife has [place] health care POA and alternate is [name] (cousin)   Would accept attempts at resuscitation but no prolonged artificial ventilation   Not sure about a feeding tube   Social Determinants of Health   Financial Resource Strain:   . Difficulty of Paying Living Expenses:   Food Insecurity:   . Worried About Charity fundraiser in the Last Year:   . Arboriculturist in the Last Year:   Transportation Needs:   . Film/video editor (Medical):   Marland Kitchen Lack of Transportation (Non-Medical):    Physical Activity:   . Days of Exercise per Week:   . Minutes of Exercise per Session:   Stress:   . Feeling of Stress :   Social Connections:   . Frequency of Communication with Friends and Family:   . Frequency of Social Gatherings with Friends and Family:   . Attends Religious Services:   . Active Member of Clubs or Organizations:   . Attends Archivist Meetings:   Marland Kitchen Marital Status:      Family History: The patient's family history includes Cancer in his father and paternal uncle. There is no history of Colon cancer, Rectal cancer, or Stomach cancer.  ROS:   Please see the history of present illness.     All other systems reviewed and are negative.  EKGs/Labs/Other Studies Reviewed:    The following studies were reviewed today:   EKG:  EKG is  ordered today.  The ekg ordered today demonstrates sinus bradycardia, otherwise normal ECG  Recent Labs: 06/16/2019: ALT 19; BUN 20; Creatinine, Ser 1.15; Hemoglobin 15.1; Platelets 256.0; Potassium 4.4; Sodium 138  Recent Lipid Panel    Component Value Date/Time   CHOL 106 07/20/2018 0959   TRIG 51.0 07/20/2018 0959   HDL 48.00 07/20/2018 0959   CHOLHDL 2 07/20/2018 0959   VLDL 10.2 07/20/2018 0959   LDLCALC 48 07/20/2018 0959    Physical Exam:    VS:  BP 116/68 (BP Location: Right Arm, Patient Position: Sitting, Cuff Size: Normal)   Pulse (!) 52   Ht 5\' 4"  (1.626 m)   Wt 176 lb 8 oz (80.1 kg)   SpO2 98%   BMI 30.30 kg/m     Wt Readings from Last 3 Encounters:  06/20/19 176 lb 8 oz (80.1 kg)  06/16/19 177 lb (80.3 kg)  01/03/19 176 lb (79.8 kg)     GEN:  Well nourished, well developed in no acute distress HEENT: Normal NECK: No JVD; No carotid bruits LYMPHATICS: No lymphadenopathy CARDIAC: RRR, no murmurs, rubs, gallops RESPIRATORY:  Clear to auscultation without rales, wheezing or rhonchi  ABDOMEN: Soft, non-tender, non-distended MUSCULOSKELETAL:  No edema; No deformity, chest tenderness noted on  palpation. SKIN: Warm and dry NEUROLOGIC:  Alert and oriented x 3 PSYCHIATRIC:  Normal affect   ASSESSMENT:    1. Irregular heart beat   2. Dyspnea on exertion   3. Essential hypertension    PLAN:    In order of problems listed above:  1. Patient with history of irregular heartbeat/heart fluttering.  EKG currently shows sinus bradycardia, heart rate 52.  Will place a cardiac monitor x2 weeks to evaluate for any significant arrhythmias.  Continue Toprol-XL as prescribed. 2. Patient states having dyspnea on exertion.  Will evaluate with an echocardiogram for any structural cardiac abnormalities.  Chest pain is noncardiac in origin/musculoskeletal due to reproducibility on palpation during my exam. 3. History of hypertension, blood pressure controlled.  Continue current BP meds.  This note was generated in part or whole with voice recognition software. Voice recognition is usually quite accurate but there are transcription errors that can and very often do occur. I apologize for any typographical errors that were not detected and corrected.  Medication Adjustments/Labs and Tests Ordered: Current medicines are reviewed at length with the patient today.  Concerns regarding medicines are outlined above.  Orders Placed This Encounter  Procedures  . LONG TERM MONITOR (3-14 DAYS)  . EKG 12-Lead  . ECHOCARDIOGRAM COMPLETE   No orders of the defined types were placed in this encounter.   Patient Instructions  Medication Instructions:  Your physician recommends that you continue on your current medications as directed. Please refer to the Current Medication list given to you today.  *If you need a refill on your cardiac medications before your next appointment, please call your pharmacy*   Lab Work: None Ordered. If you have labs (blood work) drawn today and your tests are completely normal, you will receive your results only by: Marland Kitchen MyChart Message (if you have MyChart) OR . A paper  copy in the mail If you have any lab test that is abnormal or we need to change your treatment, we will call you to review the results.   Testing/Procedures:  1.   Your physician has requested that you have an echocardiogram. Echocardiography is a painless test that uses sound waves to create images of your heart. It provides your doctor with information about the size and shape of your heart and how well your heart's chambers and valves are working. This procedure takes approximately one hour. There are no restrictions for this procedure.   2.  Your physician has recommended that you wear a Zio monitor. This monitor is a medical device that records the heart's electrical activity. Doctors most often use these monitors to diagnose arrhythmias. Arrhythmias are problems with the speed or rhythm of the heartbeat. The monitor is a small device applied to your chest. You can wear one while you do your normal daily activities. While wearing this monitor if you have any symptoms to push the button and record what you felt. Once you have worn this monitor for the period of time provider prescribed (Usually 14 days), you will return the monitor device in the postage paid box. Once it is returned they will download the data collected and provide [place] with a report which the provider will then review and we will call you with those results. Important tips:  1. Avoid showering during the first 24 hours of wearing the monitor. 2. Avoid excessive sweating to help maximize wear time. 3. Do not submerge the device, no hot tubs, and no swimming pools. 4. Keep any lotions or oils away from the patch. 5. After 24 hours you may shower with the patch on. Take brief showers with your back facing the shower head.  6. Do not remove patch once it has been placed because that will interrupt data and decrease adhesive wear time. 7. Push the button when you have any symptoms and write down what you were feeling. 8. Once you  have completed wearing your monitor, remove and place into box which has postage paid and  place in your outgoing mailbox.  9. If for some reason you have misplaced your box then call our office and we can provide another box and/or mail it off for you.          Follow-Up: At [facility], you and your health needs are our priority.  As part of our continuing mission to provide you with exceptional heart care, we have created designated Provider Care Teams.  These Care Teams include your primary Cardiologist (physician) and Advanced Practice Providers (APPs -  Physician Assistants and Nurse Practitioners) who all work together to provide you with the care you need, when you need it.  We recommend signing up for the patient portal called "MyChart".  Sign up information is provided on this After Visit Summary.  MyChart is used to connect with patients for Virtual Visits (Telemedicine).  Patients are able to view lab/test results, encounter notes, upcoming appointments, etc.  Non-urgent messages can be sent to your provider as well.   To learn more about what you can do with MyChart, go to [email or web address].    Your next appointment:   5-6 weeks, after echo and zio results   The format for your next appointment:   In Person  Provider:   [name], MD   Other Instructions   Echocardiogram An echocardiogram is a procedure that uses painless sound waves (ultrasound) to produce an image of the heart. Images from an echocardiogram can provide important information about:  Signs of coronary artery disease (CAD).  Aneurysm detection. An aneurysm is a weak or damaged part of an artery wall that bulges out from the normal force of blood pumping through the body.  Heart size and shape. Changes in the size or shape of the heart can be associated with certain conditions, including heart failure, aneurysm, and CAD.  Heart muscle function.  Heart valve function.  Signs of a  past heart attack.  Fluid buildup around the heart.  Thickening of the heart muscle.  A tumor or infectious growth around the heart valves. Tell a health care provider about:  Any allergies you have.  All medicines you are taking, including vitamins, herbs, eye drops, creams, and over-the-counter medicines.  Any blood disorders you have.  Any surgeries you have had.  Any medical conditions you have.  Whether you are pregnant or may be pregnant. What are the risks?  Generally, this is a safe procedure.  What happens before the procedure? No specific preparation is needed. You may eat and drink normally. What happens during the procedure?   An IV tube may be inserted into one of your veins.     A gel will be applied to your chest.  A wand-like tool (transducer) will be moved over your chest. The gel will help to transmit the sound waves from the transducer.  The sound waves will harmlessly bounce off of your heart to allow the heart images to be captured in real-time motion. The images will be recorded on a computer. The procedure may vary among health care providers and hospitals. What happens after the procedure?  You may return to your normal, everyday life, including diet, activities, and medicines, unless your health care provider tells you not to do that. Summary  An echocardiogram is a procedure that uses painless sound waves (ultrasound) to produce an image of the heart.  Images from an echocardiogram can provide important information about the size and shape of your heart, heart muscle function, heart valve function, and fluid  buildup around your heart.  You do not need to do anything to prepare before this procedure. You may eat and drink normally.  After the echocardiogram is completed, you may return to your normal, everyday life, unless your health care provider tells you not to do that. This information is not intended to replace advice given to you by your  health care provider. Make sure you discuss any questions you have with your health care provider. Document Revised: 04/22/2018 Document Reviewed: 02/02/2016 Elsevier Patient Education  2020 [place], [name], MD  06/20/2019 4:56 PM    [place]

## 2019-06-20 NOTE — Patient Instructions (Signed)
Medication Instructions:  Your physician recommends that you continue on your current medications as directed. Please refer to the Current Medication list given to you today.  *If you need a refill on your cardiac medications before your next appointment, please call your pharmacy*   Lab Work: None Ordered. If you have labs (blood work) drawn today and your tests are completely normal, you will receive your results only by: Marland Kitchen MyChart Message (if you have MyChart) OR . A paper copy in the mail If you have any lab test that is abnormal or we need to change your treatment, we will call you to review the results.   Testing/Procedures:  1.   Your physician has requested that you have an echocardiogram. Echocardiography is a painless test that uses sound waves to create images of your heart. It provides your doctor with information about the size and shape of your heart and how well your heart's chambers and valves are working. This procedure takes approximately one hour. There are no restrictions for this procedure.   2.  Your physician has recommended that you wear a Zio monitor. This monitor is a medical device that records the heart's electrical activity. Doctors most often use these monitors to diagnose arrhythmias. Arrhythmias are problems with the speed or rhythm of the heartbeat. The monitor is a small device applied to your chest. You can wear one while you do your normal daily activities. While wearing this monitor if you have any symptoms to push the button and record what you felt. Once you have worn this monitor for the period of time provider prescribed (Usually 14 days), you will return the monitor device in the postage paid box. Once it is returned they will download the data collected and provide [place] with a report which the provider will then review and we will call you with those results. Important tips:  1. Avoid showering during the first 24 hours of wearing the monitor. 2. Avoid  excessive sweating to help maximize wear time. 3. Do not submerge the device, no hot tubs, and no swimming pools. 4. Keep any lotions or oils away from the patch. 5. After 24 hours you may shower with the patch on. Take brief showers with your back facing the shower head.  6. Do not remove patch once it has been placed because that will interrupt data and decrease adhesive wear time. 7. Push the button when you have any symptoms and write down what you were feeling. 8. Once you have completed wearing your monitor, remove and place into box which has postage paid and place in your outgoing mailbox.  9. If for some reason you have misplaced your box then call our office and we can provide another box and/or mail it off for you.          Follow-Up: At [facility], you and your health needs are our priority.  As part of our continuing mission to provide you with exceptional heart care, we have created designated Provider Care Teams.  These Care Teams include your primary Cardiologist (physician) and Advanced Practice Providers (APPs -  Physician Assistants and Nurse Practitioners) who all work together to provide you with the care you need, when you need it.  We recommend signing up for the patient portal called "MyChart".  Sign up information is provided on this After Visit Summary.  MyChart is used to connect with patients for Virtual Visits (Telemedicine).  Patients are able to view lab/test results, encounter notes, upcoming  appointments, etc.  Non-urgent messages can be sent to your provider as well.   To learn more about what you can do with MyChart, go to [email or web address].    Your next appointment:   5-6 weeks, after echo and zio results   The format for your next appointment:   In Person  Provider:   [name], MD   Other Instructions   Echocardiogram An echocardiogram is a procedure that uses painless sound waves (ultrasound) to produce an image of the  heart. Images from an echocardiogram can provide important information about:  Signs of coronary artery disease (CAD).  Aneurysm detection. An aneurysm is a weak or damaged part of an artery wall that bulges out from the normal force of blood pumping through the body.  Heart size and shape. Changes in the size or shape of the heart can be associated with certain conditions, including heart failure, aneurysm, and CAD.  Heart muscle function.  Heart valve function.  Signs of a past heart attack.  Fluid buildup around the heart.  Thickening of the heart muscle.  A tumor or infectious growth around the heart valves. Tell a health care provider about:  Any allergies you have.  All medicines you are taking, including vitamins, herbs, eye drops, creams, and over-the-counter medicines.  Any blood disorders you have.  Any surgeries you have had.  Any medical conditions you have.  Whether you are pregnant or may be pregnant. What are the risks?  Generally, this is a safe procedure.  What happens before the procedure? No specific preparation is needed. You may eat and drink normally. What happens during the procedure?   An IV tube may be inserted into one of your veins.     A gel will be applied to your chest.  A wand-like tool (transducer) will be moved over your chest. The gel will help to transmit the sound waves from the transducer.  The sound waves will harmlessly bounce off of your heart to allow the heart images to be captured in real-time motion. The images will be recorded on a computer. The procedure may vary among health care providers and hospitals. What happens after the procedure?  You may return to your normal, everyday life, including diet, activities, and medicines, unless your health care provider tells you not to do that. Summary  An echocardiogram is a procedure that uses painless sound waves (ultrasound) to produce an image of the heart.  Images from an  echocardiogram can provide important information about the size and shape of your heart, heart muscle function, heart valve function, and fluid buildup around your heart.  You do not need to do anything to prepare before this procedure. You may eat and drink normally.  After the echocardiogram is completed, you may return to your normal, everyday life, unless your health care provider tells you not to do that. This information is not intended to replace advice given to you by your health care provider. Make sure you discuss any questions you have with your health care provider. Document Revised: 04/22/2018 Document Reviewed: 02/02/2016 Elsevier Patient Education  [place].

## 2019-06-21 ENCOUNTER — Telehealth: Payer: Self-pay | Admitting: Cardiology

## 2019-06-21 NOTE — Telephone Encounter (Signed)
Patient has some questions regarding his ZIO monitor. He is not sure when he is to "mash" it. States he has had a little bit of pain.   Pt c/o of Chest Pain: STAT if CP now or developed within 24 hours  1. Are you having CP right now? no  2. Are you experiencing any other symptoms (ex. SOB, nausea, vomiting, sweating)? No other symptoms, when walking, experiences lightheadness, some SOB  3. How long have you been experiencing CP? Couple days  4. Is your CP continuous or coming and going? Comes and goes  5. Have you taken Nitroglycerin? no ?

## 2019-06-21 NOTE — Telephone Encounter (Signed)
Spoke with patient. He described a dull ache that comes and goes around the site of the [place] monitor. He denies any SOB, N/V, sweating, or radiating pain during these events, and they are not induced by  marked activity.  He states that today he feels more of the periodic dull aches than the palpitations he has been experiencing for the past 2 weeks.   Patient is taking his medications as prescribed. He is only taking his ASA QOD as advised by his PCP.   He was not sure if he should be pressing the button on the Zio for these events or just during the palpitations. I advised that he press the button for both, and for any other symptoms if they occur. He does get SOB with activity. Patient agreed that he will also write down corresponding times and symptoms in the log book when pressing the button.  Patient will continue to monitor, and will call us back if symptoms are worsening. He also agreed if he experiences SOB and worsening of his chest pain that he will go to the ER.  He has an echo scheduled for 7/14, and a f/u visit on 7/20.  Routing to Dr.Agbor-Etang for review.

## 2019-07-12 ENCOUNTER — Other Ambulatory Visit: Payer: Self-pay | Admitting: Internal Medicine

## 2019-07-22 ENCOUNTER — Encounter: Payer: Self-pay | Admitting: Internal Medicine

## 2019-07-22 ENCOUNTER — Other Ambulatory Visit: Payer: Self-pay

## 2019-07-22 ENCOUNTER — Ambulatory Visit (INDEPENDENT_AMBULATORY_CARE_PROVIDER_SITE_OTHER): Payer: Medicare PPO | Admitting: Internal Medicine

## 2019-07-22 VITALS — BP 118/70 | HR 52 | Temp 97.9°F | Ht 64.0 in | Wt 178.0 lb

## 2019-07-22 DIAGNOSIS — N138 Other obstructive and reflux uropathy: Secondary | ICD-10-CM

## 2019-07-22 DIAGNOSIS — N401 Enlarged prostate with lower urinary tract symptoms: Secondary | ICD-10-CM

## 2019-07-22 DIAGNOSIS — R002 Palpitations: Secondary | ICD-10-CM

## 2019-07-22 DIAGNOSIS — K219 Gastro-esophageal reflux disease without esophagitis: Secondary | ICD-10-CM | POA: Diagnosis not present

## 2019-07-22 DIAGNOSIS — Z0000 Encounter for general adult medical examination without abnormal findings: Secondary | ICD-10-CM | POA: Diagnosis not present

## 2019-07-22 DIAGNOSIS — Z7189 Other specified counseling: Secondary | ICD-10-CM | POA: Diagnosis not present

## 2019-07-22 DIAGNOSIS — I10 Essential (primary) hypertension: Secondary | ICD-10-CM
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
## 2019-07-22 NOTE — Assessment & Plan Note (Signed)
BP Readings from Last 3 Encounters:  07/22/19 118/70  06/20/19 116/68  06/16/19 112/84   Okay on metoprolol and amlodipine

## 2019-07-22 NOTE — Assessment & Plan Note (Signed)
Still with some symptoms Awaiting cardiology work up

## 2019-07-22 NOTE — Assessment & Plan Note (Signed)
See social history 

## 2019-07-22 NOTE — Assessment & Plan Note (Signed)
Still symptomatic on finasteride Would reconsider tamsulosin with Good Rx if worsens

## 2019-07-22 NOTE — Assessment & Plan Note (Signed)
Quiet on omeprazole 

## 2019-07-22 NOTE — Progress Notes (Signed)
Hearing Screening   Method: Audiometry   125Hz  250Hz  500Hz  1000Hz  2000Hz  3000Hz  4000Hz  6000Hz  8000Hz   Right ear:   25 40 40  0    Left ear:   20 20 25   0    Vision Screening Comments: February 2021

## 2019-07-22 NOTE — Assessment & Plan Note (Signed)
I have personally reviewed the Medicare Annual Wellness questionnaire and have noted 1. The patient's medical and social history 2. Their use of alcohol, tobacco or illicit drugs 3. Their current medications and supplements 4. The patient's functional ability including ADL's, fall risks, home safety risks and hearing or visual             impairment. 5. Diet and physical activities 6. Evidence for depression or mood disorders  The patients weight, height, BMI and visual acuity have been recorded in the chart I have made referrals, counseling and provided education to the patient based review of the above and I have provided the pt with a written personalized care plan for preventive services.  I have provided you with a copy of your personalized plan for preventive services. Please take the time to review along with your updated medication list.  Flu vaccine in the fall Consider shingrix at pharmacy Colon due now (or may be pushed back to 7 years--will check with Dr Henrene Pastor) Consider another (last) PSA next year Tries to exercise

## 2019-07-25 DIAGNOSIS — I499 Cardiac arrhythmia, unspecified: Secondary | ICD-10-CM | POA: Diagnosis not present

## 2019-07-26 ENCOUNTER — Other Ambulatory Visit: Payer: Self-pay | Admitting: Cardiology

## 2019-07-26 DIAGNOSIS — R0609 Other forms of dyspnea: Secondary | ICD-10-CM

## 2019-07-27 ENCOUNTER — Other Ambulatory Visit: Payer: Self-pay

## 2019-07-27 ENCOUNTER — Ambulatory Visit (INDEPENDENT_AMBULATORY_CARE_PROVIDER_SITE_OTHER): Payer: Medicare PPO

## 2019-07-27 DIAGNOSIS — R0600 Dyspnea, unspecified: Secondary | ICD-10-CM

## 2019-07-27 DIAGNOSIS — R0609 Other forms of dyspnea: Secondary | ICD-10-CM

## 2019-08-02 ENCOUNTER — Other Ambulatory Visit: Payer: Self-pay

## 2019-08-02 ENCOUNTER — Telehealth: Payer: Self-pay

## 2019-08-02 ENCOUNTER — Ambulatory Visit: Payer: Medicare PPO | Admitting: Cardiology

## 2019-08-02 ENCOUNTER — Encounter: Payer: Self-pay | Admitting: Cardiology

## 2019-08-02 VITALS — BP 140/80 | HR 49 | Ht 64.0 in | Wt 179.0 lb

## 2019-08-02 DIAGNOSIS — I10 Essential (primary) hypertension: Secondary | ICD-10-CM

## 2019-08-02 DIAGNOSIS — R0600 Dyspnea, unspecified: Secondary | ICD-10-CM | POA: Diagnosis not present

## 2019-08-02 DIAGNOSIS — I471 Supraventricular tachycardia: Secondary | ICD-10-CM

## 2019-08-02 DIAGNOSIS — R0609 Other forms of dyspnea: Secondary | ICD-10-CM

## 2019-08-02 DIAGNOSIS — R072 Precordial pain: Secondary | ICD-10-CM

## 2019-08-02 NOTE — Progress Notes (Signed)
Cardiology Office Note:    Date:  08/02/2019   ID:  Bobby Woods, DOB 08-16-1945, MRN [id number]  PCP:  Venia Carbon, MD  [facility] Cardiologist:  [name], MD  [place] Electrophysiologist:  None   Referring MD: Venia Carbon, MD   Chief Complaint  Patient presents with  . office visit    F/U after cardiac testing; Meds verbally reviewed with patient.    History of Present Illness:    Bobby Woods is a 74 y.o. male with a hx of GERD, hypertension who presents for follow-up.  He was last seen due to palpitations and irregular heartbeats.  A 2-week cardiac monitor was placed to evaluate any significant arrhythmias.  Patient has been on Toprol-XL for years.  Patient works at [employer], over the past several weeks he has noticed more shortness of breath with typical exertion.  Occasionally he has nonspecific chest discomfort causing him to sit for a while which improves symptoms.  Still has occasional palpitations which is worrisome.   Past Medical History:  Diagnosis Date  . Arthritis   . Blood transfusion without reported diagnosis    age 67 year   . BPH (benign prostatic hypertrophy)   . Chronic kidney disease   . GERD (gastroesophageal reflux disease)   . Hypertension    on medicines     Past Surgical History:  Procedure Laterality Date  . COLONOSCOPY  2006  . HAND SURGERY    . [place]  . PROSTATE BIOPSY  2011   negative  . [place]   left, right 05/07  . ROTATOR CUFF REPAIR  10/09   right  . TONSILLECTOMY  1972    Current Medications: Current Meds  Medication Sig  . amLODipine (NORVASC) 5 MG tablet Take 1 tablet (5 mg total) by mouth daily.  Marland Kitchen aspirin 81 MG tablet Take 81 mg by mouth every other day.   . finasteride (PROSCAR) 5 MG tablet TAKE 1 TABLET BY MOUTH EVERY DAY  . meloxicam (MOBIC) 15 MG tablet Take 1 tablet (15 mg total) by mouth daily.  . metoprolol  succinate (TOPROL-XL) 25 MG 24 hr tablet TAKE 1 TABLET BY MOUTH EVERY DAY  . omeprazole (PRILOSEC) 20 MG capsule TAKE 1 CAPSULE BY MOUTH EVERY DAY  . tiZANidine (ZANAFLEX) 2 MG tablet TAKE 1 TO 2 TABLETS BY MOUTH AT BEDTIME AS NEEDED FOR MUSCLE SPASMS     Allergies:   Zithromax [azithromycin dihydrate]   Social History   Socioeconomic History  . Marital status: Married    Spouse name: Not on file  . Number of children: 3  . Years of education: Not on file  . Highest education level: Not on file  Occupational History  . 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 Occupation: Quality assurance--cleaning    Employer: [employer]    Comment:    . Occupation: Custodial/funeral assistance    Comment: [place]  Tobacco Use  . Smoking status: Never Smoker  . Smokeless tobacco: Never Used  Vaping Use  . Vaping Use: Never used  Substance and Sexual Activity  . Alcohol use: No  . Drug use: No  . Sexual activity: Not on file  Other Topics Concern  . Not on file  Social History Narrative   Has living will   Wife has [place] health care POA and alternate is Marketing executive  [place] (cousin)   Would accept attempts at resuscitation but no prolonged artificial ventilation   Not sure about a feeding tube   Social Determinants of Health   Financial Resource Strain:   . Difficulty of Paying Living Expenses:   Food Insecurity:   . Worried About Charity fundraiser in the Last Year:   . Arboriculturist in the Last Year:   Transportation Needs:   . Film/video editor (Medical):   Marland Kitchen Lack of Transportation (Non-Medical):   Physical Activity:   . Days of Exercise per Week:   . Minutes of Exercise per Session:   Stress:   . Feeling of Stress :   Social Connections:   . Frequency of Communication with Friends and Family:   . Frequency of Social Gatherings with Friends and Family:   . Attends Religious Services:   . Active Member of Clubs or Organizations:    . Attends Archivist Meetings:   Marland Kitchen Marital Status:      Family History: The patient's family history includes Cancer in his father and paternal uncle. There is no history of Colon cancer, Rectal cancer, or Stomach cancer.  ROS:   Please see the history of present illness.     All other systems reviewed and are negative.  EKGs/Labs/Other Studies Reviewed:    The following studies were reviewed today:   EKG:  EKG is  ordered today.  The ekg ordered today demonstrates sinus bradycardia, otherwise normal ECG  Recent Labs: 06/16/2019: ALT 19; BUN 20; Creatinine, Ser 1.15; Hemoglobin 15.1; Platelets 256.0; Potassium 4.4; Sodium 138  Recent Lipid Panel    Component Value Date/Time   CHOL 106 07/20/2018 0959   TRIG 51.0 07/20/2018 0959   HDL 48.00 07/20/2018 0959   CHOLHDL 2 07/20/2018 0959   VLDL 10.2 07/20/2018 0959   LDLCALC 48 07/20/2018 0959    Physical Exam:    VS:  BP 140/80 (BP Location: Left Arm, Patient Position: Sitting, Cuff Size: Normal)   Pulse (!) 49   Ht 5' 4"  (1.626 m)   Wt 179 lb (81.2 kg)   SpO2 97%   BMI 30.73 kg/m     Wt Readings from Last 3 Encounters:  08/02/19 179 lb (81.2 kg)  07/22/19 178 lb (80.7 kg)  06/20/19 176 lb 8 oz (80.1 kg)     GEN:  Well nourished, well developed in no acute distress HEENT: Normal NECK: No JVD; No carotid bruits LYMPHATICS: No lymphadenopathy CARDIAC: RRR, no murmurs, rubs, gallops RESPIRATORY:  Clear to auscultation without rales, wheezing or rhonchi  ABDOMEN: Soft, non-tender, non-distended MUSCULOSKELETAL:  No edema; No deformity, chest tenderness noted on palpation. SKIN: Warm and dry NEUROLOGIC:  Alert and oriented x 3 PSYCHIATRIC:  Normal affect   ASSESSMENT:    1. Paroxysmal SVT (supraventricular tachycardia) ([place])   2. Dyspnea on exertion   3. Essential hypertension   4. Precordial pain    PLAN:    In order of problems listed above:  1. Patient with history of irregular  heartbeat/heart fluttering.  2-week cardiac monitor showed frequent SVTs with some associated with patient triggered events.  He currently takes Toprol-XL, heart rate 49 bpm, sinus bradycardia.  Will refer patient to EP for any additional input.  May consider switching patient to verapamil to see if this helps. 2. Patient states having dyspnea on exertion and nonspecific chest discomfort.  Echocardiogram shows normal systolic and diastolic function, EF 60 to 65%.  He has risk factors  of age, hypertension.  Get coronary CTA to evaluate presence of CAD. 3. History of hypertension, blood pressure controlled.  Continue current BP meds.  Follow-up after coronary CTA.  This note was generated in part or whole with voice recognition software. Voice recognition is usually quite accurate but there are transcription errors that can and very often do occur. I apologize for any typographical errors that were not detected and corrected.  Medication Adjustments/Labs and Tests Ordered: Current medicines are reviewed at length with the patient today.  Concerns regarding medicines are outlined above.  Orders Placed This Encounter  Procedures  . CT CORONARY MORPH W/CTA COR W/SCORE W/CA W/CM &/OR WO/CM  . CT CORONARY FRACTIONAL FLOW RESERVE DATA PREP  . CT CORONARY FRACTIONAL FLOW RESERVE FLUID ANALYSIS  . Basic Metabolic Panel (BMET)  . Ambulatory referral to Cardiac Electrophysiology  . EKG 12-Lead   No orders of the defined types were placed in this encounter.   Patient Instructions  Medication Instructions:   Your physician recommends that you continue on your current medications as directed. Please refer to the Current Medication list given to you today.  *If you need a refill on your cardiac medications before your next appointment, please call your pharmacy*   Lab Work: If you have labs (blood work) drawn today and your tests are completely normal, you will receive your results only by: Marland Kitchen MyChart  Message (if you have MyChart) OR . A paper copy in the mail If you have any lab test that is abnormal or we need to change your treatment, we will call you to review the results.   Testing/Procedures:  1.  Your physician has requested that you have cardiac CT. Cardiac computed tomography (CT) is a painless test that uses an x-ray machine to take clear, detailed pictures of your heart.   Your cardiac CT will be scheduled at one of the below locations:   [facility] [address] [place], [place] [address] [phone]  [place] [address] [place], [place] [address] [phone]  If scheduled at [facility], please arrive at the [facility] main entrance of [facility] 30 minutes prior to test start time. Proceed to the [facility] Radiology Department (first floor) to check-in and test prep.  If scheduled at [facility], please arrive 15 mins early for check-in and test prep.  Please follow these instructions carefully (unless otherwise directed):  Hold all erectile dysfunction medications at least 3 days (72 hrs) prior to test.  On the Night Before the Test: . Be sure to Drink plenty of water. . Do not consume any caffeinated/decaffeinated beverages or chocolate 12 hours prior to your test. . Do not take any antihistamines 12 hours prior to your test.   On the Day of the Test: . Drink plenty of water. Do not drink any water within one hour of the test. . Do not eat any food 4 hours prior to the test. . You may take your regular medications prior to the test.  . Take metoprolol succinate (TOPROL-XL) 25 MG 24 hr tablet the morning of procedure.       After the Test: . Drink plenty of water. . After receiving IV contrast, you may experience a mild flushed feeling. This is normal. . On occasion, you may experience a mild rash up to 24 hours after the test. This  is not dangerous. If this occurs, you can  take Benadryl 25 mg and increase your fluid intake. . If you experience trouble breathing, this can be serious. If it is severe call 911 IMMEDIATELY. If it is mild, please call our office. . If you take any of these medications: Glipizide/Metformin, Avandament, Glucavance, please do not take 48 hours after completing test unless otherwise instructed.   Once we have confirmed authorization from your insurance company, we will call you to set up a date and time for your test. Based on how quickly your insurance processes prior authorizations requests, please allow up to 4 weeks to be contacted for scheduling your Cardiac CT appointment. Be advised that routine Cardiac CT appointments could be scheduled as many as 8 weeks after your provider has ordered it.  For non-scheduling related questions, please contact the cardiac imaging nurse navigator should you have any questions/concerns: Marchia Bond, Cardiac Imaging Nurse Navigator Burley Saver, Interim Cardiac Imaging Nurse [place] [facility] Direct Office Dial: [phone]   For scheduling needs, including cancellations and rescheduling, please call Vivien Rota at [phone], option 3.    Follow-Up: At [facility], you and your health needs are our priority.  As part of our continuing mission to provide you with exceptional heart care, we have created designated Provider Care Teams.  These Care Teams include your primary Cardiologist (physician) and Advanced Practice Providers (APPs -  Physician Assistants and Nurse Practitioners) who all work together to provide you with the care you need, when you need it.  We recommend signing up for the patient portal called "MyChart".  Sign up information is provided on this After Visit Summary.  MyChart is used to connect with patients for Virtual Visits (Telemedicine).  Patients are able to view lab/test results, encounter notes, upcoming  appointments, etc.  Non-urgent messages can be sent to your provider as well.   To learn more about what you can do with MyChart, go to [email or web address].    Your next appointment:   3 week(s)  The format for your next appointment:   In Person  Provider:   [name], MD   Other Instructions  Referral to Dr. Caryl Comes     Signed, [name], MD  08/02/2019 10:50 AM    [place]

## 2019-08-02 NOTE — Telephone Encounter (Signed)
-----   Message from Venia Carbon, MD sent at 07/24/2019 12:23 PM EDT ----- Shawnie Dapper, please call him and let him know he should get a recall letter within the next 2 years ----- Message ----- From: Irene Shipper, MD Sent: 07/22/2019   3:28 PM EDT To: Venia Carbon, MD  A couple of small adenomas. 5-7 range ----- Message ----- From: Venia Carbon, MD Sent: 07/22/2019   9:14 AM EDT To: Irene Shipper, MD  John, He had small tubular adenoma in 2016. Is that now a 7 year recall? Rich

## 2019-08-02 NOTE — Patient Instructions (Signed)
Medication Instructions:   Your physician recommends that you continue on your current medications as directed. Please refer to the Current Medication list given to you today.  *If you need a refill on your cardiac medications before your next appointment, please call your pharmacy*   Lab Work: If you have labs (blood work) drawn today and your tests are completely normal, you will receive your results only by: Marland Kitchen MyChart Message (if you have MyChart) OR . A paper copy in the mail If you have any lab test that is abnormal or we need to change your treatment, we will call you to review the results.   Testing/Procedures:  1.  Your physician has requested that you have cardiac CT. Cardiac computed tomography (CT) is a painless test that uses an x-ray machine to take clear, detailed pictures of your heart.   Your cardiac CT will be scheduled at one of the below locations:   [facility] [address] [place], [place] [address] [phone]  [place] [address] [place], [place] [address] [phone]  If scheduled at [facility], please arrive at the [facility] main entrance of [facility] 30 minutes prior to test start time. Proceed to the [facility] Radiology Department (first floor) to check-in and test prep.  If scheduled at [facility], please arrive 15 mins early for check-in and test prep.  Please follow these instructions carefully (unless otherwise directed):  Hold all erectile dysfunction medications at least 3 days (72 hrs) prior to test.  On the Night Before the Test: . Be sure to Drink plenty of water. . Do not consume any caffeinated/decaffeinated beverages or chocolate 12 hours prior to your test. . Do not take any antihistamines 12 hours prior to your test.   On the Day of the Test: . Drink plenty of water. Do not drink any water within one hour of  the test. . Do not eat any food 4 hours prior to the test. . You may take your regular medications prior to the test.  . Take metoprolol succinate (TOPROL-XL) 25 MG 24 hr tablet the morning of procedure.       After the Test: . Drink plenty of water. . After receiving IV contrast, you may experience a mild flushed feeling. This is normal. . On occasion, you may experience a mild rash up to 24 hours after the test. This is not dangerous. If this occurs, you can take Benadryl 25 mg and increase your fluid intake. . If you experience trouble breathing, this can be serious. If it is severe call 911 IMMEDIATELY. If it is mild, please call our office. . If you take any of these medications: Glipizide/Metformin, Avandament, Glucavance, please do not take 48 hours after completing test unless otherwise instructed.   Once we have confirmed authorization from your insurance company, we will call you to set up a date and time for your test. Based on how quickly your insurance processes prior authorizations requests, please allow up to 4 weeks to be contacted for scheduling your Cardiac CT appointment. Be advised that routine Cardiac CT appointments could be scheduled as many as 8 weeks after your provider has ordered it.  For non-scheduling related questions, please contact the cardiac imaging nurse navigator should you have any questions/concerns: Marchia Bond, Cardiac Imaging Nurse Navigator Burley Saver, Interim Cardiac Imaging Nurse [place] and Vascular Services Direct Office Dial: [phone]  For scheduling needs, including cancellations and rescheduling, please call Vivien Rota at [phone], option 3.    Follow-Up: At [facility], you and your health needs are our priority.  As part of our continuing mission to provide you with exceptional heart care, we have created designated Provider Care Teams.  These Care Teams include your primary Cardiologist (physician) and Advanced  Practice Providers (APPs -  Physician Assistants and Nurse Practitioners) who all work together to provide you with the care you need, when you need it.  We recommend signing up for the patient portal called "MyChart".  Sign up information is provided on this After Visit Summary.  MyChart is used to connect with patients for Virtual Visits (Telemedicine).  Patients are able to view lab/test results, encounter notes, upcoming appointments, etc.  Non-urgent messages can be sent to your provider as well.   To learn more about what you can do with MyChart, go to [email or web address].    Your next appointment:   3 week(s)  The format for your next appointment:   In Person  Provider:   [name], MD   Other Instructions  Referral to Dr. Caryl Comes

## 2019-08-02 NOTE — Telephone Encounter (Signed)
Left message on verified VM with note from Dr Letvak. ?

## 2019-08-03 LAB — BASIC METABOLIC PANEL
BUN/Creatinine Ratio: 15 (ref 10–24)
BUN: 17 mg/dL (ref 8–27)
CO2: 24 mmol/L (ref 20–29)
Calcium: 9.3 mg/dL (ref 8.6–10.2)
Chloride: 103 mmol/L (ref 96–106)
Creatinine, Ser: 1.13 mg/dL (ref 0.76–1.27)
GFR calc Af Amer: 74 mL/min/{1.73_m2} (ref >59)
GFR calc non Af Amer: 64 mL/min/{1.73_m2} (ref >59)
Glucose: 90 mg/dL (ref 65–99)
Potassium: 4.7 mmol/L (ref 3.5–5.2)
Sodium: 140 mmol/L (ref 134–144)

## 2019-08-11 DIAGNOSIS — H2703 Aphakia, bilateral: Secondary | ICD-10-CM | POA: Diagnosis not present

## 2019-08-22 ENCOUNTER — Ambulatory Visit (INDEPENDENT_AMBULATORY_CARE_PROVIDER_SITE_OTHER): Payer: Medicare PPO | Admitting: Cardiology

## 2019-08-22 ENCOUNTER — Other Ambulatory Visit: Payer: Self-pay

## 2019-08-22 ENCOUNTER — Encounter: Payer: Self-pay | Admitting: Cardiology

## 2019-08-22 VITALS — BP 110/70 | HR 51 | Ht 64.0 in | Wt 178.1 lb

## 2019-08-22 DIAGNOSIS — R0600 Dyspnea, unspecified: Secondary | ICD-10-CM

## 2019-08-22 DIAGNOSIS — I471 Supraventricular tachycardia: Secondary | ICD-10-CM | POA: Diagnosis not present

## 2019-08-22 DIAGNOSIS — R0609 Other forms of dyspnea: Secondary | ICD-10-CM

## 2019-08-22 DIAGNOSIS — I10 Essential (primary) hypertension: Secondary | ICD-10-CM | POA: Diagnosis not present

## 2019-08-22 NOTE — Patient Instructions (Signed)
Medication Instructions:  Your physician has recommended you make the following change in your medication:  1- STOP Norvasc.  *If you need a refill on your cardiac medications before your next appointment, please call your pharmacy*   Lab Work: none If you have labs (blood work) drawn today and your tests are completely normal, you will receive your results only by: Marland Kitchen MyChart Message (if you have MyChart) OR . A paper copy in the mail If you have any lab test that is abnormal or we need to change your treatment, we will call you to review the results.   Testing/Procedures:   1.  Your physician has requested that you have cardiac CT. Cardiac computed tomography (CT) is a painless test that uses an x-ray machine to take clear, detailed pictures of your heart.   Your cardiac CT will be scheduled at one of the below locations:   [facility] [address] [place], [place] [address] [phone]  [place] [address] [place], [place] [address] [phone]  If scheduled at [facility], please arrive at the [facility] main entrance of [name] 30 minutes prior to test start time. Proceed to the [facility] Radiology Department (first floor) to check-in and test prep.  If scheduled at [facility], please arrive 15 mins early for check-in and test prep.  Please follow these instructions carefully (unless otherwise directed):  Hold all erectile dysfunction medications at least 3 days (72 hrs) prior to test.  On the Night Before the Test:  Be sure to Drink plenty of water.  Do not consume any caffeinated/decaffeinated beverages or chocolate 12 hours prior to your test.  Do not take any antihistamines 12 hours prior to your test.   On the Day of the Test:  Drink plenty of water. Do not drink any water within one hour of the test.  Do not eat any  food 4 hours prior to the test.  You may take your regular medications prior to the test.   Take metoprolol succinate (TOPROL-XL) 25 MG 24 hr tablet the morning of procedure.       After the Test:  Drink plenty of water.  After receiving IV contrast, you may experience a mild flushed feeling. This is normal.  On occasion, you may experience a mild rash up to 24 hours after the test. This is not dangerous. If this occurs, you can take Benadryl 25 mg and increase your fluid intake.  If you experience trouble breathing, this can be serious. If it is severe call 911 IMMEDIATELY. If it is mild, please call our office.  If you take any of these medications: Glipizide/Metformin, Avandament, Glucavance, please do not take 48 hours after completing test unless otherwise instructed.   Once we have confirmed authorization from your insurance company, we will call you to set up a date and time for your test. Based on how quickly your insurance processes prior authorizations requests, please allow up to 4 weeks to be contacted for scheduling your Cardiac CT appointment. Be advised that routine Cardiac CT appointments could be scheduled as many as 8 weeks after your provider has ordered it.  For non-scheduling related questions, please contact the cardiac imaging nurse navigator should you have any questions/concerns: Marchia Bond, Cardiac Imaging Nurse Navigator Burley Saver, Interim Cardiac Imaging Nurse [place] and Vascular Services Direct Office Dial: [phone]   For scheduling needs, including  cancellations and rescheduling, please call Vivien Rota at [phone], option 3.     Follow-Up:  Keep follow up as scheduled with Dr Quentin Ore.   At [facility], you and your health needs are our priority.  As part of our continuing mission to provide you with exceptional heart care, we have created designated Provider Care Teams.  These Care Teams include your primary  Cardiologist (physician) and Advanced Practice Providers (APPs -  Physician Assistants and Nurse Practitioners) who all work together to provide you with the care you need, when you need it.  We recommend signing up for the patient portal called "MyChart".  Sign up information is provided on this After Visit Summary.  MyChart is used to connect with patients for Virtual Visits (Telemedicine).  Patients are able to view lab/test results, encounter notes, upcoming appointments, etc.  Non-urgent messages can be sent to your provider as well.   To learn more about what you can do with MyChart, go to [email or web address].    Your next appointment:   1 month(s) - Prefer to have the Coronary CTA but if not check with Dr Garen Lah.   The format for your next appointment:   In Person  Provider:    You may see [name], MD or one of the following Advanced Practice Providers on your designated Care Team:    Murray Hodgkins, NP  Christell Faith, PA-C  Marrianne Mood, PA-C

## 2019-08-22 NOTE — Progress Notes (Signed)
Cardiology Office Note:    Date:  08/22/2019   ID:  Bobby Woods, DOB 10/10/1945, MRN [id number]  PCP:  Venia Carbon, MD  [facility] Cardiologist:  [name], MD  [place] Electrophysiologist:  None   Referring MD: Venia Carbon, MD   Chief Complaint  Patient presents with  . other    3 week follow up. Meds reviewed by the pt. verbally. Pt. c/o chest pain and irreg. heart beats.     History of Present Illness:    Bobby Woods is a 74 y.o. male with a hx of GERD, hypertension who presents for follow-up.  He was last seen due to palpitations and irregular heartbeats.  Cardiac monitor showed paroxysmal SVTs associated with patient triggered events.  Patient was referred to electrophysiology, appointment is scheduled in 2 days.  Coronary CTA was ordered, patient has not received a call to have scan done.  He otherwise states feeling fatigue and out of breath.  Previous echo showed normal systolic and diastolic function, EF 60 to 65%.  Past Medical History:  Diagnosis Date  . Arthritis   . Blood transfusion without reported diagnosis    age 71 year   . BPH (benign prostatic hypertrophy)   . Chronic kidney disease   . GERD (gastroesophageal reflux disease)   . Hypertension    on medicines     Past Surgical History:  Procedure Laterality Date  . COLONOSCOPY  2006  . HAND SURGERY    . [place]  . PROSTATE BIOPSY  2011   negative  . [place]   left, right 05/07  . ROTATOR CUFF REPAIR  10/09   right  . TONSILLECTOMY  1972    Current Medications: Current Meds  Medication Sig  . aspirin 81 MG tablet Take 81 mg by mouth every other day.   . finasteride (PROSCAR) 5 MG tablet TAKE 1 TABLET BY MOUTH EVERY DAY  . meloxicam (MOBIC) 15 MG tablet Take 1 tablet (15 mg total) by mouth daily.  . metoprolol succinate (TOPROL-XL) 25 MG 24 hr tablet TAKE 1 TABLET BY MOUTH EVERY DAY  . omeprazole (PRILOSEC) 20 MG  capsule TAKE 1 CAPSULE BY MOUTH EVERY DAY  . tiZANidine (ZANAFLEX) 2 MG tablet TAKE 1 TO 2 TABLETS BY MOUTH AT BEDTIME AS NEEDED FOR MUSCLE SPASMS  . [DISCONTINUED] amLODipine (NORVASC) 5 MG tablet Take 1 tablet (5 mg total) by mouth daily.     Allergies:   Zithromax [azithromycin dihydrate]   Social History   Socioeconomic History  . Marital status: Married    Spouse name: Not on file  . Number of children: 3  . Years of education: Not on file  . Highest education level: Not on file  Occupational History  . Occupation: Psychologist, prison and probation services   Retired 8/13    Employer: [employer]  . Occupation: Quality assurance--cleaning    Employer: [employer]    Comment:    . Occupation: Custodial/funeral assistance    Comment: [place]  Tobacco Use  . Smoking status: Never Smoker  . Smokeless tobacco: Never Used  Vaping Use  . Vaping Use: Never used  Substance and Sexual Activity  . Alcohol use: No  . Drug use: No  . Sexual activity: Not on file  Other Topics Concern  . Not on file  Social History Narrative   Has living will   Wife has [place] health care POA and alternate  is [name] (cousin)   Would accept attempts at resuscitation but no prolonged artificial ventilation   Not sure about a feeding tube   Social Determinants of Health   Financial Resource Strain:   . Difficulty of Paying Living Expenses:   Food Insecurity:   . Worried About Charity fundraiser in the Last Year:   . Arboriculturist in the Last Year:   Transportation Needs:   . Film/video editor (Medical):   Marland Kitchen Lack of Transportation (Non-Medical):   Physical Activity:   . Days of Exercise per Week:   . Minutes of Exercise per Session:   Stress:   . Feeling of Stress :   Social Connections:   . Frequency of Communication with Friends and Family:   . Frequency of Social Gatherings with Friends and Family:   . Attends Religious Services:   . Active Member of Clubs or Organizations:   .  Attends Archivist Meetings:   Marland Kitchen Marital Status:      Family History: The patient's family history includes Cancer in his father and paternal uncle. There is no history of Colon cancer, Rectal cancer, or Stomach cancer.  ROS:   Please see the history of present illness.     All other systems reviewed and are negative.  EKGs/Labs/Other Studies Reviewed:    The following studies were reviewed today:   EKG:  EKG is  ordered today.  The ekg ordered today demonstrates sinus bradycardia, otherwise normal ECG, HR 51bpm Recent Labs: 06/16/2019: ALT 19; Hemoglobin 15.1; Platelets 256.0 08/02/2019: BUN 17; Creatinine, Ser 1.13; Potassium 4.7; Sodium 140  Recent Lipid Panel    Component Value Date/Time   CHOL 106 07/20/2018 0959   TRIG 51.0 07/20/2018 0959   HDL 48.00 07/20/2018 0959   CHOLHDL 2 07/20/2018 0959   VLDL 10.2 07/20/2018 0959   LDLCALC 48 07/20/2018 0959    Physical Exam:    VS:  BP 110/70 (BP Location: Left Arm, Patient Position: Sitting, Cuff Size: Normal)   Pulse (!) 51   Ht _0  (1.626 m)   Wt 178 lb 2 oz (80.8 kg)   SpO2 98%   BMI 30.58 kg/m     Wt Readings from Last 3 Encounters:  08/22/19 178 lb 2 oz (80.8 kg)  08/02/19 179 lb (81.2 kg)  07/22/19 178 lb (80.7 kg)     GEN:  Well nourished, well developed in no acute distress HEENT: Normal NECK: No JVD; No carotid bruits LYMPHATICS: No lymphadenopathy CARDIAC: RRR, no murmurs, rubs, gallops RESPIRATORY:  Clear to auscultation without rales, wheezing or rhonchi  ABDOMEN: Soft, non-tender, non-distended MUSCULOSKELETAL:  No edema; No deformity, chest tenderness noted on palpation. SKIN: Warm and dry NEUROLOGIC:  Alert and oriented x 3 PSYCHIATRIC:  Normal affect   ASSESSMENT:    1. Paroxysmal SVT (supraventricular tachycardia) ([place])   2. Dyspnea on exertion   3. Essential hypertension    PLAN:    In order of problems listed above:  1. History of palpitations, cardiac monitor showed  paroxysmal SVT noted patient triggered events.  Continue Toprol-XL, keep appointment with electrophysiology for additional input.. 2. Patient states having dyspnea and fatigue.  Echocardiogram shows normal systolic and diastolic function, EF 60 to 65%.  Coronary CTA is pending insurance approval.  Stop Norvasc to give patient more blood pressure room. 3. History of hypertension, blood pressure controlled.  Continue Toprol-XL, stop Norvasc.  Follow-up after coronary CTA.  Total encounter time 35 minutes  Greater than 50% was spent in counseling and coordination of care with the patient   This note was generated in part or whole with voice recognition software. Voice recognition is usually quite accurate but there are transcription errors that can and very often do occur. I apologize for any typographical errors that were not detected and corrected.  Medication Adjustments/Labs and Tests Ordered: Current medicines are reviewed at length with the patient today.  Concerns regarding medicines are outlined above.  Orders Placed This Encounter  Procedures  . EKG 12-Lead   No orders of the defined types were placed in this encounter.   Patient Instructions  Medication Instructions:  Your physician has recommended you make the following change in your medication:  1- STOP Norvasc.  *If you need a refill on your cardiac medications before your next appointment, please call your pharmacy*   Lab Work: none If you have labs (blood work) drawn today and your tests are completely normal, you will receive your results only by: Marland Kitchen MyChart Message (if you have MyChart) OR . A paper copy in the mail If you have any lab test that is abnormal or we need to change your treatment, we will call you to review the results.   Testing/Procedures:   1.  Your physician has requested that you have cardiac CT. Cardiac computed tomography (CT) is a painless test that uses an x-ray machine to take clear,  detailed pictures of your heart.   Your cardiac CT will be scheduled at one of the below locations:   [facility] [address] [place], [place] [address] [phone]  [place] [address] [place], [place] [address] [phone]  If scheduled at [facility] please arrive at the [facility] main entrance of [facility] 30 minutes prior to test start time. Proceed to the [facility] Radiology Department (first floor) to check-in and test prep.  If scheduled at [facility], please arrive 15 mins early for check-in and test prep.  Please follow these instructions carefully (unless otherwise directed):  Hold all erectile dysfunction medications at least 3 days (72 hrs) prior to test.  On the Night Before the Test:  Be sure to Drink plenty of water.  Do not consume any caffeinated/decaffeinated beverages or chocolate 12 hours prior to your test.  Do not take any antihistamines 12 hours prior to your test.   On the Day of the Test:  Drink plenty of water. Do not drink any water within one hour of the test.  Do not eat any food 4 hours prior to the test.  You may take your regular medications prior to the test.   Take metoprolol succinate (TOPROL-XL) 25 MG 24 hr tablet the morning of procedure.       After the Test:  Drink plenty of water.  After receiving IV contrast, you may experience a mild flushed feeling. This is normal.  On occasion, you may experience a mild rash up to 24 hours after the test. This is not dangerous. If this occurs, you can take Benadryl 25 mg and increase your fluid intake.  If you experience trouble breathing, this can be serious. If it is severe call 911 IMMEDIATELY. If it is mild, please call our office.  If you take any of these medications: Glipizide/Metformin, Avandament, Glucavance, please do not take 48 hours after  completing test unless otherwise instructed.   Once we have confirmed  authorization from your insurance company, we will call you to set up a date and time for your test. Based on how quickly your insurance processes prior authorizations requests, please allow up to 4 weeks to be contacted for scheduling your Cardiac CT appointment. Be advised that routine Cardiac CT appointments could be scheduled as many as 8 weeks after your provider has ordered it.  For non-scheduling related questions, please contact the cardiac imaging nurse navigator should you have any questions/concerns: Marchia Bond, Cardiac Imaging Nurse Navigator Burley Saver, Interim Cardiac Imaging Nurse [place] and Vascular Services Direct Office Dial: [phone]   For scheduling needs, including cancellations and rescheduling, please call Vivien Rota at [phone], option 3.     Follow-Up:  Keep follow up as scheduled with Dr Quentin Ore.   At [facility], you and your health needs are our priority.  As part of our continuing mission to provide you with exceptional heart care, we have created designated Provider Care Teams.  These Care Teams include your primary Cardiologist (physician) and Advanced Practice Providers (APPs -  Physician Assistants and Nurse Practitioners) who all work together to provide you with the care you need, when you need it.  We recommend signing up for the patient portal called "MyChart".  Sign up information is provided on this After Visit Summary.  MyChart is used to connect with patients for Virtual Visits (Telemedicine).  Patients are able to view lab/test results, encounter notes, upcoming appointments, etc.  Non-urgent messages can be sent to your provider as well.   To learn more about what you can do with MyChart, go to [email or web address].    Your next appointment:   1 month(s) - Prefer to have the Coronary CTA but if not check with Dr Garen Lah.   The format for  your next appointment:   In Person  Provider:    You may see [name], MD or one of the following Advanced Practice Providers on your designated Care Team:    Murray Hodgkins, NP  Christell Faith, PA-C  Marrianne Mood, PA-C       Signed, [name], MD  08/22/2019 4:46 PM    [place]

## 2019-08-23 NOTE — H&P (View-Only) (Signed)
Cardiology Office Note:    Date:  08/24/2019   ID:  Bobby Woods, DOB [birth year], MRN [id number]  PCP:  Venia Carbon, MD  [facility] Cardiologist:  [name], MD  [facility] Electrophysiologist:  Vickie Epley, MD   Referring MD: [name], MD   Chief Complaint  Patient presents with  . Other    Referred by PCP for PSVT and Palpitations. Meds reviewed verbally with patient.     History of Present Illness:    Bobby Woods is a 74 y.o. male with a hx of hypertension who presents to clinic with palpitations and irregular heartbeats.  He was recently seen by Dr. Garen Lah in clinic and complained of dyspnea with exertion and fatigue.  He has a coronary CTA scheduled for August 12.  He describes long history of palpitations which previously were managed successfully with beta blocker. This then had to be stopped recently due to very low heart rates which were symptomatic. Since stopping the beta blockers, the burden of PSVT has increased. Overall, Bobby Woods has been very active until recently. 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 Recently, he has fatigued after only a small amount of work and takes multiple breaks while working. No syncope.   Past Medical History:  Diagnosis Date  . Arthritis   . Blood transfusion without reported diagnosis    age 72 year   . BPH (benign prostatic hypertrophy)   . Chronic kidney disease   . GERD (gastroesophageal reflux disease)   . Hypertension    on medicines     Past Surgical History:  Procedure Laterality Date  . COLONOSCOPY  2006  . HAND SURGERY    . [place]  . PROSTATE BIOPSY  2011   negative  . [place]   left, right 05/07  . ROTATOR CUFF REPAIR  10/09   right  . TONSILLECTOMY  1972    Current Medications: Current Meds  Medication Sig  . aspirin 81 MG tablet Take 81 mg by mouth every other day.   . finasteride  (PROSCAR) 5 MG tablet TAKE 1 TABLET BY MOUTH EVERY DAY  . meloxicam (MOBIC) 15 MG tablet Take 1 tablet (15 mg total) by mouth daily.  Marland Kitchen omeprazole (PRILOSEC) 20 MG capsule TAKE 1 CAPSULE BY MOUTH EVERY DAY  . tiZANidine (ZANAFLEX) 2 MG tablet TAKE 1 TO 2 TABLETS BY MOUTH AT BEDTIME AS NEEDED FOR MUSCLE SPASMS     Allergies:   Zithromax [azithromycin dihydrate]   Social History   Socioeconomic History  . Marital status: Married    Spouse name: Not on file  . Number of children: 3  . Years of education: Not on file  . Highest education level: Not on file  Occupational History  . Occupation: Psychologist, prison and probation services   Retired 8/13    Employer: [employer]  . Occupation: Quality assurance--cleaning    Employer: [employer]    Comment:    . Occupation: Custodial/funeral assistance    Comment: [place]  Tobacco Use  . Smoking status: Never Smoker  . Smokeless tobacco: Never Used  Vaping Use  . Vaping Use: Never used  Substance and Sexual Activity  . Alcohol use: No  . Drug use: No  . Sexual activity: Not on file  Other Topics Concern  . Not on file  Social History Narrative   Has living will   Wife  has [place] [facility] care POA and alternate is [name] (cousin)   Would accept attempts at resuscitation but no prolonged artificial ventilation   Not sure about a feeding tube   Social Determinants of Health   Financial Resource Strain:   . Difficulty of Paying Living Expenses:   Food Insecurity:   . Worried About Charity fundraiser in the Last Year:   . Arboriculturist in the Last Year:   Transportation Needs:   . Film/video editor (Medical):   Marland Kitchen Lack of Transportation (Non-Medical):   Physical Activity:   . Days of Exercise per Week:   . Minutes of Exercise per Session:   Stress:   . Feeling of Stress :   Social Connections:   . Frequency of Communication with Friends and Family:   . Frequency of Social Gatherings with Friends and Family:   . Attends  Religious Services:   . Active Member of Clubs or Organizations:   . Attends Archivist Meetings:   Marland Kitchen Marital Status:      Family History: The patient's family history includes Cancer in his father and paternal uncle. There is no history of Colon cancer, Rectal cancer, or Stomach cancer.  ROS:   Please see the history of present illness.    All other systems reviewed and are negative.  EKGs/Labs/Other Studies Reviewed:    The following studies were reviewed today: ECG, echo, Zio patch  EKG:  EKG is ordered today.  The ekg ordered today demonstrates sinus bradycardia.  Echocardiogram from July 2021 was personally reviewed by me and reveals normal left ventricular function, no valvular abnormalities.  Zio patch from June 2021 reveals sinus bradycardia and episodes of what appears to be an idioventricular rhythm with rates in the 50s.  There are also episodes of SVT which are regular with rates in the 130s.  P waves are difficult to discern on the Zio patch.  These self terminating episodes could be consistent with an atrial tachycardia or another supraventricular arrhythmia such as AVNRT.  No episodes are sustained and they are inconsistently associated with patient triggered events.   Recent Labs: 06/16/2019: ALT 19; Hemoglobin 15.1; Platelets 256.0 08/02/2019: BUN 17; Creatinine, Ser 1.13; Potassium 4.7; Sodium 140  Recent Lipid Panel    Component Value Date/Time   CHOL 106 07/20/2018 0959   TRIG 51.0 07/20/2018 0959   HDL 48.00 07/20/2018 0959   CHOLHDL 2 07/20/2018 0959   VLDL 10.2 07/20/2018 0959   LDLCALC 48 07/20/2018 0959    Physical Exam:    VS:  BP 120/66 (BP Location: Left Arm, Patient Position: Sitting, Cuff Size: Normal)   Pulse 65   Ht 5\' 4"  (1.626 m)   Wt 179 lb (81.2 kg)   SpO2 97%   BMI 30.73 kg/m     Wt Readings from Last 3 Encounters:  08/24/19 179 lb (81.2 kg)  08/22/19 178 lb 2 oz (80.8 kg)  08/02/19 179 lb (81.2 kg)     GEN: Well  nourished, well developed in no acute distress HEENT: Normal NECK: No JVD; No carotid bruits LYMPHATICS: No lymphadenopathy CARDIAC: RRR, no murmurs, rubs, gallops RESPIRATORY:  Clear to auscultation without rales, wheezing or rhonchi  ABDOMEN: Soft, non-tender, non-distended MUSCULOSKELETAL:  No edema; No deformity  SKIN: Warm and dry NEUROLOGIC:  Alert and oriented x 3 PSYCHIATRIC:  Normal affect   ASSESSMENT:    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 Paroxysmal SVT (supraventricular tachycardia) ([place])  PLAN:    In order of problems listed above:  1. Tachycardia-Bradycardia syndrome and PSVT The patient's history is consistent with tachybradycardia syndrome.  His brief episodes of SVT were previously well controlled on beta-blocker but now there is an element of sinus node dysfunction which prevents him from taking an effective dose.  I suspect the idioventricular rhythm seen on ZIO is related to the sinus node dysfunction with a robust ventricular escape.  For this reason I recommend we place a dual-chamber pacemaker which will facilitate him taking beta-blockers to control his paroxysmal SVT.  The atrial lead will provide some diagnostic data regarding the atrial arrhythmias and ensure AV synchrony.  His EF is normal and I do not anticipate him requiring a large amount of ventricular pacing.  We will get this scheduled for our earliest available slot.       Medication Adjustments/Labs and Tests Ordered: Current medicines are reviewed at length with the patient today.  Concerns regarding medicines are outlined above.  Orders Placed This Encounter  Procedures  . CBC w/Diff  . Basic Metabolic Panel (BMET)  . EKG 12-Lead   No orders of the defined types were placed in this encounter.   Patient Instructions  Medication Instructions:  Your physician recommends that you continue on your current medications as directed. Please refer to the Current Medication list given to  you today.  Labwork: You will get lab work today:  BMP and CBC  Testing/Procedures: None ordered.  Follow-Up:  SEE INSTRUCTION LETTER  Any Other Special Instructions Will Be Listed Below (If Applicable).  If you need a refill on your cardiac medications before your next appointment, please call your pharmacy.    Pacemaker Implantation, Adult Pacemaker implantation is a procedure to place a pacemaker inside your chest. A pacemaker is a small computer that sends electrical signals to the heart and helps your heart beat normally. A pacemaker also stores information about your heart rhythms. You may need pacemaker implantation if you:  Have a slow heartbeat (bradycardia).  Faint (syncope).  Have shortness of breath (dyspnea) due to heart problems. The pacemaker attaches to your heart through a wire, called a lead. Sometimes just one lead is needed. Other times, there will be two leads. There are two types of pacemakers:  Transvenous pacemaker. This type is placed under the skin or muscle of your chest. The lead goes through a vein in the chest area to reach the inside of the heart.  Epicardial pacemaker. This type is placed under the skin or muscle of your chest or belly. The lead goes through your chest to the outside of the heart. Tell a health care provider about:  Any allergies you have.  All medicines you are taking, including vitamins, herbs, eye drops, creams, and over-the-counter medicines.  Any problems you or family members have had with anesthetic medicines.  Any blood or bone disorders you have.  Any surgeries you have had.  Any medical conditions you have.  Whether you are pregnant or may be pregnant. What are the risks? Generally, this is a safe procedure. However, problems may occur, including:  Infection.  Bleeding.  Failure of the pacemaker or the lead.  Collapse of a lung or bleeding into a lung.  Blood clot inside a blood vessel with a  lead.  Damage to the heart.  Infection inside the heart (endocarditis).  Allergic reactions to medicines. What happens before the procedure? Staying hydrated Follow instructions from your health care provider about hydration, which  may include:  Up to 2 hours before the procedure - you may continue to drink clear liquids, such as water, clear fruit juice, black coffee, and plain tea. Eating and drinking restrictions Follow instructions from your health care provider about eating and drinking, which may include:  8 hours before the procedure - stop eating heavy meals or foods such as meat, fried foods, or fatty foods.  6 hours before the procedure - stop eating light meals or foods, such as toast or cereal.  6 hours before the procedure - stop drinking milk or drinks that contain milk.  2 hours before the procedure - stop drinking clear liquids. Medicines  Ask your health care provider about: ? Changing or stopping your regular medicines. This is especially important if you are taking diabetes medicines or blood thinners. ? Taking medicines such as aspirin and ibuprofen. These medicines can thin your blood. Do not take these medicines before your procedure if your health care provider instructs you not to.  You may be given antibiotic medicine to help prevent infection. General instructions  You will have a heart evaluation. This may include an electrocardiogram (ECG), chest X-ray, and heart imaging (echocardiogram,  or echo) tests.  You will have blood tests.  Do not use any products that contain nicotine or tobacco, such as cigarettes and e-cigarettes. If you need help quitting, ask your health care provider.  Plan to have someone take you home from the hospital or clinic.  If you will be going home right after the procedure, plan to have someone with you for 24 hours.  Ask your health care provider how your surgical site will be marked or identified. What happens during  the procedure?  To reduce your risk of infection: ? Your health care team will wash or sanitize their hands. ? Your skin will be washed with soap. ? Hair may be removed from the surgical area.  An IV tube will be inserted into one of your veins.  You will be given one or more of the following: ? A medicine to help you relax (sedative). ? A medicine to numb the area (local anesthetic). ? A medicine to make you fall asleep (general anesthetic).  If you are getting a transvenous pacemaker: ? An incision will be made in your upper chest. ? A pocket will be made for the pacemaker. It may be placed under the skin or between layers of muscle. ? The lead will be inserted into a blood vessel that returns to the heart. ? While X-rays are taken by an imaging machine (fluoroscopy), the lead will be advanced through the vein to the inside of your heart. ? The other end of the lead will be tunneled under the skin and attached to the pacemaker.  If you are getting an epicardial pacemaker: ? An incision will be made near your ribs or breastbone (sternum) for the lead. ? The lead will be attached to the outside of your heart. ? Another incision will be made in your chest or upper belly to create a pocket for the pacemaker. ? The free end of the lead will be tunneled under the skin and attached to the pacemaker.  The transvenous or epicardial pacemaker will be tested. Imaging studies may be done to check the lead position.  The incisions will be closed with stitches (sutures), adhesive strips, or skin glue.  Bandages (dressing) will be placed over the incisions. The procedure may vary among health care providers and hospitals. What happens  after the procedure?  Your blood pressure, heart rate, breathing rate, and blood oxygen level will be monitored until the medicines you were given have worn off.  You will be given antibiotics and pain medicine.  ECG and chest x-rays will be done.  You will  wear a continuous type of ECG (Holter monitor) to check your heart rhythm.  Your health care provider will program the pacemaker.  Do not drive for 24 hours if you received a sedative. This information is not intended to replace advice given to you by your health care provider. Make sure you discuss any questions you have with your health care provider. Document Revised: 09/18/2017 Document Reviewed: 06/13/2015 Elsevier Patient Education  2020 [place], Vickie Epley, MD  08/24/2019 1:13 PM    [place]

## 2019-08-23 NOTE — Progress Notes (Signed)
Cardiology Office Note:    Date:  08/24/2019   ID:  Bobby Woods, DOB 1945-02-11, MRN [id number]  PCP:  Bobby Carbon, MD  [facility] Cardiologist:  Bobby Sable, MD  [facility] Electrophysiologist:  Bobby Epley, MD   Referring MD: Bobby Sable, MD   Chief Complaint  Patient presents with  . Other    Referred by PCP for PSVT and Palpitations. Meds reviewed verbally with patient.     History of Present Illness:    Bobby Woods is a 74 y.o. male with a hx of hypertension who presents to clinic with palpitations and irregular heartbeats.  He was recently seen by Bobby Woods in clinic and complained of dyspnea with exertion and fatigue.  He has a coronary CTA scheduled for August 12.  He describes long history of palpitations which previously were managed successfully with beta blocker. This then had to be stopped recently due to very low heart rates which were symptomatic. Since stopping the beta blockers, the burden of PSVT has increased. Overall, Bobby Woods has been very active until recently. 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 Recently, he has fatigued after only a small amount of work and takes multiple breaks while working. No syncope.   Past Medical History:  Diagnosis Date  . Arthritis   . Blood transfusion without reported diagnosis    age 49 year   . BPH (benign prostatic hypertrophy)   . Chronic kidney disease   . GERD (gastroesophageal reflux disease)   . Hypertension    on medicines     Past Surgical History:  Procedure Laterality Date  . COLONOSCOPY  2006  . HAND SURGERY    . [place]  . PROSTATE BIOPSY  2011   negative  . [place]   left, right 05/07  . ROTATOR CUFF REPAIR  10/09   right  . TONSILLECTOMY  1972    Current Medications: Current Meds  Medication Sig  . aspirin 81 MG tablet Take 81 mg by mouth every other day.   . finasteride  (PROSCAR) 5 MG tablet TAKE 1 TABLET BY MOUTH EVERY DAY  . meloxicam (MOBIC) 15 MG tablet Take 1 tablet (15 mg total) by mouth daily.  Marland Kitchen omeprazole (PRILOSEC) 20 MG capsule TAKE 1 CAPSULE BY MOUTH EVERY DAY  . tiZANidine (ZANAFLEX) 2 MG tablet TAKE 1 TO 2 TABLETS BY MOUTH AT BEDTIME AS NEEDED FOR MUSCLE SPASMS     Allergies:   Zithromax [azithromycin dihydrate]   Social History   Socioeconomic History  . Marital status: Married    Spouse name: Not on file  . Number of children: 3  . Years of education: Not on file  . Highest education level: Not on file  Occupational History  . Occupation: Psychologist, prison and probation services   Retired 8/13    Employer: [employer]  . Occupation: Quality assurance--cleaning    Employer: [employer]    Comment:    . Occupation: Custodial/funeral assistance    Comment: [place]  Tobacco Use  . Smoking status: Never Smoker  . Smokeless tobacco: Never Used  Vaping Use  . Vaping Use: Never used  Substance and Sexual Activity  . Alcohol use: No  . Drug use: No  . Sexual activity: Not on file  Other Topics Concern  . Not on file  Social History Narrative   Has living will   Wife  has [place] health care POA and alternate is [name] (cousin)   Would accept attempts at resuscitation but no prolonged artificial ventilation   Not sure about a feeding tube   Social Determinants of Health   Financial Resource Strain:   . Difficulty of Paying Living Expenses:   Food Insecurity:   . Worried About Charity fundraiser in the Last Year:   . Arboriculturist in the Last Year:   Transportation Needs:   . Film/video editor (Medical):   Marland Kitchen Lack of Transportation (Non-Medical):   Physical Activity:   . Days of Exercise per Week:   . Minutes of Exercise per Session:   Stress:   . Feeling of Stress :   Social Connections:   . Frequency of Communication with Friends and Family:   . Frequency of Social Gatherings with Friends and Family:   . Attends  Religious Services:   . Active Member of Clubs or Organizations:   . Attends Archivist Meetings:   Marland Kitchen Marital Status:      Family History: The patient's family history includes Cancer in his father and paternal uncle. There is no history of Colon cancer, Rectal cancer, or Stomach cancer.  ROS:   Please see the history of present illness.    All other systems reviewed and are negative.  EKGs/Labs/Other Studies Reviewed:    The following studies were reviewed today: ECG, echo, Zio patch  EKG:  EKG is ordered today.  The ekg ordered today demonstrates sinus bradycardia.  Echocardiogram from July 2021 was personally reviewed by me and reveals normal left ventricular function, no valvular abnormalities.  Zio patch from June 2021 reveals sinus bradycardia and episodes of what appears to be an idioventricular rhythm with rates in the 50s.  There are also episodes of SVT which are regular with rates in the 130s.  P waves are difficult to discern on the Zio patch.  These self terminating episodes could be consistent with an atrial tachycardia or another supraventricular arrhythmia such as AVNRT.  No episodes are sustained and they are inconsistently associated with patient triggered events.   Recent Labs: 06/16/2019: ALT 19; Hemoglobin 15.1; Platelets 256.0 08/02/2019: BUN 17; Creatinine, Ser 1.13; Potassium 4.7; Sodium 140  Recent Lipid Panel    Component Value Date/Time   CHOL 106 07/20/2018 0959   TRIG 51.0 07/20/2018 0959   HDL 48.00 07/20/2018 0959   CHOLHDL 2 07/20/2018 0959   VLDL 10.2 07/20/2018 0959   LDLCALC 48 07/20/2018 0959    Physical Exam:    VS:  BP 120/66 (BP Location: Left Arm, Patient Position: Sitting, Cuff Size: Normal)   Pulse 65   Ht 5\' 4"  (1.626 m)   Wt 179 lb (81.2 kg)   SpO2 97%   BMI 30.73 kg/m     Wt Readings from Last 3 Encounters:  08/24/19 179 lb (81.2 kg)  08/22/19 178 lb 2 oz (80.8 kg)  08/02/19 179 lb (81.2 kg)     GEN: Well  nourished, well developed in no acute distress HEENT: Normal NECK: No JVD; No carotid bruits LYMPHATICS: No lymphadenopathy CARDIAC: RRR, no murmurs, rubs, gallops RESPIRATORY:  Clear to auscultation without rales, wheezing or rhonchi  ABDOMEN: Soft, non-tender, non-distended MUSCULOSKELETAL:  No edema; No deformity  SKIN: Warm and dry NEUROLOGIC:  Alert and oriented x 3 PSYCHIATRIC:  Normal affect   ASSESSMENT:    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 Paroxysmal SVT (supraventricular tachycardia) ([place])  PLAN:    In order of problems listed above:  1. Tachycardia-Bradycardia syndrome and PSVT The patient's history is consistent with tachybradycardia syndrome.  His brief episodes of SVT were previously well controlled on beta-blocker but now there is an element of sinus node dysfunction which prevents him from taking an effective dose.  I suspect the idioventricular rhythm seen on ZIO is related to the sinus node dysfunction with a robust ventricular escape.  For this reason I recommend we place a dual-chamber pacemaker which will facilitate him taking beta-blockers to control his paroxysmal SVT.  The atrial lead will provide some diagnostic data regarding the atrial arrhythmias and ensure AV synchrony.  His EF is normal and I do not anticipate him requiring a large amount of ventricular pacing.  We will get this scheduled for our earliest available slot.       Medication Adjustments/Labs and Tests Ordered: Current medicines are reviewed at length with the patient today.  Concerns regarding medicines are outlined above.  Orders Placed This Encounter  Procedures  . CBC w/Diff  . Basic Metabolic Panel (BMET)  . EKG 12-Lead   No orders of the defined types were placed in this encounter.   Patient Instructions  Medication Instructions:  Your physician recommends that you continue on your current medications as directed. Please refer to the Current Medication list given to  you today.  Labwork: You will get lab work today:  BMP and CBC  Testing/Procedures: None ordered.  Follow-Up:  SEE INSTRUCTION LETTER  Any Other Special Instructions Will Be Listed Below (If Applicable).  If you need a refill on your cardiac medications before your next appointment, please call your pharmacy.    Pacemaker Implantation, Adult Pacemaker implantation is a procedure to place a pacemaker inside your chest. A pacemaker is a small computer that sends electrical signals to the heart and helps your heart beat normally. A pacemaker also stores information about your heart rhythms. You may need pacemaker implantation if you:  Have a slow heartbeat (bradycardia).  Faint (syncope).  Have shortness of breath (dyspnea) due to heart problems. The pacemaker attaches to your heart through a wire, called a lead. Sometimes just one lead is needed. Other times, there will be two leads. There are two types of pacemakers:  Transvenous pacemaker. This type is placed under the skin or muscle of your chest. The lead goes through a vein in the chest area to reach the inside of the heart.  Epicardial pacemaker. This type is placed under the skin or muscle of your chest or belly. The lead goes through your chest to the outside of the heart. Tell a health care provider about:  Any allergies you have.  All medicines you are taking, including vitamins, herbs, eye drops, creams, and over-the-counter medicines.  Any problems you or family members have had with anesthetic medicines.  Any blood or bone disorders you have.  Any surgeries you have had.  Any medical conditions you have.  Whether you are pregnant or may be pregnant. What are the risks? Generally, this is a safe procedure. However, problems may occur, including:  Infection.  Bleeding.  Failure of the pacemaker or the lead.  Collapse of a lung or bleeding into a lung.  Blood clot inside a blood vessel with a  lead.  Damage to the heart.  Infection inside the heart (endocarditis).  Allergic reactions to medicines. What happens before the procedure? Staying hydrated Follow instructions from your health care provider about hydration, which  may include:  Up to 2 hours before the procedure - you may continue to drink clear liquids, such as water, clear fruit juice, black coffee, and plain tea. Eating and drinking restrictions Follow instructions from your health care provider about eating and drinking, which may include:  8 hours before the procedure - stop eating heavy meals or foods such as meat, fried foods, or fatty foods.  6 hours before the procedure - stop eating light meals or foods, such as toast or cereal.  6 hours before the procedure - stop drinking milk or drinks that contain milk.  2 hours before the procedure - stop drinking clear liquids. Medicines  Ask your health care provider about: ? Changing or stopping your regular medicines. This is especially important if you are taking diabetes medicines or blood thinners. ? Taking medicines such as aspirin and ibuprofen. These medicines can thin your blood. Do not take these medicines before your procedure if your health care provider instructs you not to.  You may be given antibiotic medicine to help prevent infection. General instructions  You will have a heart evaluation. This may include an electrocardiogram (ECG), chest X-ray, and heart imaging (echocardiogram,  or echo) tests.  You will have blood tests.  Do not use any products that contain nicotine or tobacco, such as cigarettes and e-cigarettes. If you need help quitting, ask your health care provider.  Plan to have someone take you home from the hospital or clinic.  If you will be going home right after the procedure, plan to have someone with you for 24 hours.  Ask your health care provider how your surgical site will be marked or identified. What happens during  the procedure?  To reduce your risk of infection: ? Your health care team will wash or sanitize their hands. ? Your skin will be washed with soap. ? Hair may be removed from the surgical area.  An IV tube will be inserted into one of your veins.  You will be given one or more of the following: ? A medicine to help you relax (sedative). ? A medicine to numb the area (local anesthetic). ? A medicine to make you fall asleep (general anesthetic).  If you are getting a transvenous pacemaker: ? An incision will be made in your upper chest. ? A pocket will be made for the pacemaker. It may be placed under the skin or between layers of muscle. ? The lead will be inserted into a blood vessel that returns to the heart. ? While X-rays are taken by an imaging machine (fluoroscopy), the lead will be advanced through the vein to the inside of your heart. ? The other end of the lead will be tunneled under the skin and attached to the pacemaker.  If you are getting an epicardial pacemaker: ? An incision will be made near your ribs or breastbone (sternum) for the lead. ? The lead will be attached to the outside of your heart. ? Another incision will be made in your chest or upper belly to create a pocket for the pacemaker. ? The free end of the lead will be tunneled under the skin and attached to the pacemaker.  The transvenous or epicardial pacemaker will be tested. Imaging studies may be done to check the lead position.  The incisions will be closed with stitches (sutures), adhesive strips, or skin glue.  Bandages (dressing) will be placed over the incisions. The procedure may vary among health care providers and hospitals. What happens  after the procedure?  Your blood pressure, heart rate, breathing rate, and blood oxygen level will be monitored until the medicines you were given have worn off.  You will be given antibiotics and pain medicine.  ECG and chest x-rays will be done.  You will  wear a continuous type of ECG (Holter monitor) to check your heart rhythm.  Your health care provider will program the pacemaker.  Do not drive for 24 hours if you received a sedative. This information is not intended to replace advice given to you by your health care provider. Make sure you discuss any questions you have with your health care provider. Document Revised: 09/18/2017 Document Reviewed: 06/13/2015 Elsevier Patient Education  2020 [place], Bobby Epley, MD  08/24/2019 1:13 PM    [place]

## 2019-08-24 ENCOUNTER — Encounter: Payer: Self-pay | Admitting: Cardiology

## 2019-08-24 ENCOUNTER — Telehealth (HOSPITAL_COMMUNITY): Payer: Self-pay | Admitting: Emergency Medicine

## 2019-08-24 ENCOUNTER — Other Ambulatory Visit: Payer: Self-pay

## 2019-08-24 ENCOUNTER — Ambulatory Visit: Payer: Medicare PPO | Admitting: Cardiology

## 2019-08-24 VITALS — BP 120/66 | HR 65 | Ht 64.0 in | Wt 179.0 lb

## 2019-08-24 DIAGNOSIS — I471 Supraventricular tachycardia: Secondary | ICD-10-CM

## 2019-08-24 DIAGNOSIS — I495 Sick sinus syndrome: Secondary | ICD-10-CM

## 2019-08-24 NOTE — Patient Instructions (Addendum)
Medication Instructions:  Your physician recommends that you continue on your current medications as directed. Please refer to the Current Medication list given to you today.  Labwork: You will get lab work today:  BMP and CBC  Testing/Procedures: None ordered.  Follow-Up:  SEE INSTRUCTION LETTER  Any Other Special Instructions Will Be Listed Below (If Applicable).  If you need a refill on your cardiac medications before your next appointment, please call your pharmacy.    Pacemaker Implantation, Adult Pacemaker implantation is a procedure to place a pacemaker inside your chest. A pacemaker is a small computer that sends electrical signals to the heart and helps your heart beat normally. A pacemaker also stores information about your heart rhythms. You may need pacemaker implantation if you:  Have a slow heartbeat (bradycardia).  Faint (syncope).  Have shortness of breath (dyspnea) due to heart problems. The pacemaker attaches to your heart through a wire, called a lead. Sometimes just one lead is needed. Other times, there will be two leads. There are two types of pacemakers:  Transvenous pacemaker. This type is placed under the skin or muscle of your chest. The lead goes through a vein in the chest area to reach the inside of the heart.  Epicardial pacemaker. This type is placed under the skin or muscle of your chest or belly. The lead goes through your chest to the outside of the heart. Tell a health care provider about:  Any allergies you have.  All medicines you are taking, including vitamins, herbs, eye drops, creams, and over-the-counter medicines.  Any problems you or family members have had with anesthetic medicines.  Any blood or bone disorders you have.  Any surgeries you have had.  Any medical conditions you have.  Whether you are pregnant or may be pregnant. What are the risks? Generally, this is a safe procedure. However, problems may occur,  including:  Infection.  Bleeding.  Failure of the pacemaker or the lead.  Collapse of a lung or bleeding into a lung.  Blood clot inside a blood vessel with a lead.  Damage to the heart.  Infection inside the heart (endocarditis).  Allergic reactions to medicines. What happens before the procedure? Staying hydrated Follow instructions from your health care provider about hydration, which may include:  Up to 2 hours before the procedure - you may continue to drink clear liquids, such as water, clear fruit juice, black coffee, and plain tea. Eating and drinking restrictions Follow instructions from your health care provider about eating and drinking, which may include:  8 hours before the procedure - stop eating heavy meals or foods such as meat, fried foods, or fatty foods.  6 hours before the procedure - stop eating light meals or foods, such as toast or cereal.  6 hours before the procedure - stop drinking milk or drinks that contain milk.  2 hours before the procedure - stop drinking clear liquids. Medicines  Ask your health care provider about: ? Changing or stopping your regular medicines. This is especially important if you are taking diabetes medicines or blood thinners. ? Taking medicines such as aspirin and ibuprofen. These medicines can thin your blood. Do not take these medicines before your procedure if your health care provider instructs you not to.  You may be given antibiotic medicine to help prevent infection. General instructions  You will have a heart evaluation. This may include an electrocardiogram (ECG), chest X-ray, and heart imaging (echocardiogram,  or echo) tests.  You will have   blood tests.  Do not use any products that contain nicotine or tobacco, such as cigarettes and e-cigarettes. If you need help quitting, ask your health care provider.  Plan to have someone take you home from the hospital or clinic.  If you will be going home right after  the procedure, plan to have someone with you for 24 hours.  Ask your health care provider how your surgical site will be marked or identified. What happens during the procedure?  To reduce your risk of infection: ? Your health care team will wash or sanitize their hands. ? Your skin will be washed with soap. ? Hair may be removed from the surgical area.  An IV tube will be inserted into one of your veins.  You will be given one or more of the following: ? A medicine to help you relax (sedative). ? A medicine to numb the area (local anesthetic). ? A medicine to make you fall asleep (general anesthetic).  If you are getting a transvenous pacemaker: ? An incision will be made in your upper chest. ? A pocket will be made for the pacemaker. It may be placed under the skin or between layers of muscle. ? The lead will be inserted into a blood vessel that returns to the heart. ? While X-rays are taken by an imaging machine (fluoroscopy), the lead will be advanced through the vein to the inside of your heart. ? The other end of the lead will be tunneled under the skin and attached to the pacemaker.  If you are getting an epicardial pacemaker: ? An incision will be made near your ribs or breastbone (sternum) for the lead. ? The lead will be attached to the outside of your heart. ? Another incision will be made in your chest or upper belly to create a pocket for the pacemaker. ? The free end of the lead will be tunneled under the skin and attached to the pacemaker.  The transvenous or epicardial pacemaker will be tested. Imaging studies may be done to check the lead position.  The incisions will be closed with stitches (sutures), adhesive strips, or skin glue.  Bandages (dressing) will be placed over the incisions. The procedure may vary among health care providers and hospitals. What happens after the procedure?  Your blood pressure, heart rate, breathing rate, and blood oxygen level will  be monitored until the medicines you were given have worn off.  You will be given antibiotics and pain medicine.  ECG and chest x-rays will be done.  You will wear a continuous type of ECG (Holter monitor) to check your heart rhythm.  Your health care provider will program the pacemaker.  Do not drive for 24 hours if you received a sedative. This information is not intended to replace advice given to you by your health care provider. Make sure you discuss any questions you have with your health care provider. Document Revised: 09/18/2017 Document Reviewed: 06/13/2015 Elsevier Patient Education  2020 Elsevier Inc.  

## 2019-08-24 NOTE — Telephone Encounter (Signed)
Attempted to call patient regarding upcoming cardiac CT appointment. °Left message on voicemail with name and callback number °Colbin Jovel Kline RN Navigator Cardiac Imaging °[facility] °[phone] Office °[phone] Cell ° °

## 2019-08-24 NOTE — Telephone Encounter (Signed)
Pt returning phone call regarding upcoming cardiac imaging study; pt verbalizes understanding of appt date/time, parking situation and where to check in, pre-test NPO status and medications ordered, and verified current allergies; name and call back number provided for further questions should they arise Kashten Gowin Lennix RN Navigator Cardiac Imaging [facility] [phone] office [phone] cell   

## 2019-08-25 ENCOUNTER — Ambulatory Visit
Admission: RE | Admit: 2019-08-25 | Discharge: 2019-08-25 | Disposition: A | Discharge status: Home / Self Care | Payer: Medicare PPO | Source: Ambulatory Visit | Attending: Cardiology | Admitting: Cardiology

## 2019-08-25 ENCOUNTER — Telehealth: Payer: Self-pay | Admitting: Cardiology

## 2019-08-25 ENCOUNTER — Other Ambulatory Visit: Payer: Self-pay

## 2019-08-25 DIAGNOSIS — R072 Precordial pain: Secondary | ICD-10-CM | POA: Diagnosis not present

## 2019-08-25 LAB — CBC WITH DIFFERENTIAL/PLATELET
Basophils Absolute: 0 10*3/uL (ref 0.0–0.2)
Basos: 0 %
EOS (ABSOLUTE): 0.2 10*3/uL (ref 0.0–0.4)
Eos: 3 %
Hematocrit: 46.3 % (ref 37.5–51.0)
Hemoglobin: 15.6 g/dL (ref 13.0–17.7)
Immature Grans (Abs): 0 10*3/uL (ref 0.0–0.1)
Immature Granulocytes: 0 %
Lymphocytes Absolute: 1.6 10*3/uL (ref 0.7–3.1)
Lymphs: 23 %
MCH: 31 pg (ref 26.6–33.0)
MCHC: 33.7 g/dL (ref 31.5–35.7)
MCV: 92 fL (ref 79–97)
Monocytes Absolute: 0.7 10*3/uL (ref 0.1–0.9)
Monocytes: 10 %
Neutrophils Absolute: 4.4 10*3/uL (ref 1.4–7.0)
Neutrophils: 64 %
Platelets: 182 10*3/uL (ref 150–450)
RBC: 5.04 x10E6/uL (ref 4.14–5.80)
RDW: 12.8 % (ref 11.6–15.4)
WBC: 7 10*3/uL (ref 3.4–10.8)

## 2019-08-25 LAB — BASIC METABOLIC PANEL
BUN/Creatinine Ratio: 14 (ref 10–24)
BUN: 17 mg/dL (ref 8–27)
CO2: 23 mmol/L (ref 20–29)
Calcium: 8.9 mg/dL (ref 8.6–10.2)
Chloride: 104 mmol/L (ref 96–106)
Creatinine, Ser: 1.23 mg/dL (ref 0.76–1.27)
GFR calc Af Amer: 67 mL/min/{1.73_m2} (ref >59)
GFR calc non Af Amer: 58 mL/min/{1.73_m2} — ABNORMAL LOW (ref >59)
Glucose: 137 mg/dL — ABNORMAL HIGH (ref 65–99)
Potassium: 4.1 mmol/L (ref 3.5–5.2)
Sodium: 140 mmol/L (ref 134–144)

## 2019-08-25 MED ORDER — IOHEXOL 350 MG/ML SOLN
85.0000 mL | Freq: Once | INTRAVENOUS | Status: AC | PRN
  Administered 2019-08-25: 85 mL via INTRAVENOUS

## 2019-08-25 MED ORDER — NITROGLYCERIN 0.4 MG SL SUBL
0.8000 mg | SUBLINGUAL_TABLET | Freq: Once | SUBLINGUAL | Status: AC
  Administered 2019-08-25: 0.8 mg via SUBLINGUAL

## 2019-08-25 NOTE — Telephone Encounter (Signed)
Patients CT was done today.

## 2019-08-25 NOTE — Telephone Encounter (Signed)
[name], CMA  P Cv [facility] [place] Scheduling Patient has an appointment next week for follow up on CT. Patient has not had this done, could someone please reach out to patient in reference to this?   Thank you

## 2019-08-25 NOTE — Telephone Encounter (Signed)
Please advise on status of scheduling

## 2019-08-25 NOTE — Progress Notes (Signed)
Patient tolerated procedure well. Ambulate w/o difficulty. Sitting in chair drinking. No needs. All questions answered. ABC intact. Discharge from procedure area w/o issues.

## 2019-08-25 NOTE — Telephone Encounter (Signed)
Patient is requesting a letter that states the requirements of a weight he can lift at his job. Please call to discuss.

## 2019-08-25 NOTE — Telephone Encounter (Deleted)
-----   Message from [name], [place] sent at 08/15/2019  2:22 PM EDT ----- Regarding: CORONARY CT Patient has an appointment next week for follow up on CT. Patient has not had this done, could someone please reach out to patient in reference to this?  Thank you

## 2019-08-26 ENCOUNTER — Telehealth: Payer: Self-pay | Admitting: Cardiology

## 2019-08-26 NOTE — Telephone Encounter (Signed)
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 When he is cleared to go back to work.

## 2019-08-26 NOTE — Telephone Encounter (Signed)
-----   Message from [name], MD sent at 08/25/2019 5:11 PM EDT -----  Okay for patient to do as much as he can tolerate. From a cardiac perspective, all testing has been normal. Normal echocardiogram, coronary CTA reviewed today by myself with no CAD. Thank you

## 2019-08-26 NOTE — Telephone Encounter (Signed)
I spoke with the patient regarding Dr. Thereasa Solo recommendations. He was more curious about lifting restrictions after his PPM implant that is scheduled on 09/16/19 with Dr. Quentin Ore.  I advised the patient he will have lifting restrictions after the procedure that will be temporary. He will be notified at his wound check when his restrictions can be lifted.  The patient voices understanding and is agreeable.

## 2019-08-31 NOTE — Telephone Encounter (Signed)
Left message for Pt advising his work letter was available to pick up at the front desk at the [place] office.

## 2019-09-05 ENCOUNTER — Telehealth: Payer: Self-pay | Admitting: Cardiology

## 2019-09-05 NOTE — Telephone Encounter (Signed)
Returned call to Pt.  Advised he will be at the hospital for 8-9 hours.  Will potentially be ready to discharge around 4-5 pm.  Advised short stay nurses will call his driver when he is ready to discharge.

## 2019-09-05 NOTE — Telephone Encounter (Signed)
Patient wants to know how long he will stay for icd procedure he is arranging transportation .

## 2019-09-14 ENCOUNTER — Other Ambulatory Visit
Admission: RE | Admit: 2019-09-14 | Discharge: 2019-09-14 | Disposition: A | Discharge status: Home / Self Care | Payer: Medicare PPO | Source: Ambulatory Visit | Attending: Cardiology | Admitting: Cardiology

## 2019-09-14 DIAGNOSIS — Z20822 Contact with and (suspected) exposure to covid-19: Secondary | ICD-10-CM | POA: Diagnosis not present

## 2019-09-14 DIAGNOSIS — Z01812 Encounter for preprocedural laboratory examination: Secondary | ICD-10-CM | POA: Diagnosis not present

## 2019-09-14 LAB — SARS CORONAVIRUS 2 (TAT 6-24 HRS): SARS Coronavirus 2: NEGATIVE

## 2019-09-16 ENCOUNTER — Ambulatory Visit (HOSPITAL_COMMUNITY)
Admission: RE | Admit: 2019-09-16 | Discharge: 2019-09-16 | Disposition: A | Discharge status: Home / Self Care | Payer: Medicare PPO | Attending: Cardiology | Admitting: Cardiology

## 2019-09-16 ENCOUNTER — Other Ambulatory Visit: Payer: Self-pay

## 2019-09-16 ENCOUNTER — Encounter (HOSPITAL_COMMUNITY): Payer: Self-pay | Admitting: Cardiology

## 2019-09-16 ENCOUNTER — Ambulatory Visit (HOSPITAL_COMMUNITY): Payer: Medicare PPO

## 2019-09-16 ENCOUNTER — Ambulatory Visit (HOSPITAL_COMMUNITY)
Admission: RE | Disposition: A | Discharge status: Home / Self Care | Payer: Medicare PPO | Source: Home / Self Care | Attending: Cardiology

## 2019-09-16 DIAGNOSIS — K219 Gastro-esophageal reflux disease without esophagitis: Secondary | ICD-10-CM | POA: Diagnosis not present

## 2019-09-16 DIAGNOSIS — I129 Hypertensive chronic kidney disease with stage 1 through stage 4 chronic kidney disease, or unspecified chronic kidney disease: Secondary | ICD-10-CM | POA: Insufficient documentation

## 2019-09-16 DIAGNOSIS — Z7982 Long term (current) use of aspirin: Secondary | ICD-10-CM | POA: Insufficient documentation

## 2019-09-16 DIAGNOSIS — N400 Enlarged prostate without lower urinary tract symptoms: Secondary | ICD-10-CM | POA: Diagnosis not present

## 2019-09-16 DIAGNOSIS — I471 Supraventricular tachycardia: Secondary | ICD-10-CM | POA: Diagnosis not present

## 2019-09-16 DIAGNOSIS — Z79899 Other long term (current) drug therapy: Secondary | ICD-10-CM | POA: Diagnosis not present

## 2019-09-16 DIAGNOSIS — Z950 Presence of cardiac pacemaker: Secondary | ICD-10-CM | POA: Diagnosis not present

## 2019-09-16 DIAGNOSIS — I495 Sick sinus syndrome: Secondary | ICD-10-CM | POA: Insufficient documentation

## 2019-09-16 DIAGNOSIS — N189 Chronic kidney disease, unspecified: Secondary | ICD-10-CM | POA: Insufficient documentation

## 2019-09-16 HISTORY — PX: PACEMAKER IMPLANT: EP1218

## 2019-09-16 SURGERY — PACEMAKER IMPLANT

## 2019-09-16 MED ORDER — HEPARIN (PORCINE) IN NACL 1000-0.9 UT/500ML-% IV SOLN
INTRAVENOUS | Status: AC
  Filled 2019-09-16: qty 500

## 2019-09-16 MED ORDER — METOPROLOL SUCCINATE ER 25 MG PO TB24
25.0000 mg | ORAL_TABLET | Freq: Every day | ORAL | Status: AC
  Administered 2019-09-16: 25 mg via ORAL
  Filled 2019-09-16 (×2): qty 1

## 2019-09-16 MED ORDER — CEFAZOLIN SODIUM-DEXTROSE 2-3 GM-%(50ML) IV SOLR
INTRAVENOUS | Status: AC | PRN
  Administered 2019-09-16: 2 g via INTRAVENOUS

## 2019-09-16 MED ORDER — CEFAZOLIN SODIUM-DEXTROSE 2-4 GM/100ML-% IV SOLN
2.0000 g | INTRAVENOUS | Status: AC
  Administered 2019-09-16: 2 g via INTRAVENOUS
  Filled 2019-09-16: qty 100

## 2019-09-16 MED ORDER — LIDOCAINE HCL (PF) 1 % IJ SOLN
INTRAMUSCULAR | Status: DC | PRN
  Administered 2019-09-16: 60 mL

## 2019-09-16 MED ORDER — CEFAZOLIN SODIUM-DEXTROSE 2-4 GM/100ML-% IV SOLN
INTRAVENOUS | Status: AC
  Filled 2019-09-16: qty 100

## 2019-09-16 MED ORDER — MIDAZOLAM HCL 5 MG/5ML IJ SOLN
INTRAMUSCULAR | Status: DC | PRN
  Administered 2019-09-16 (×3): 1 mg via INTRAVENOUS

## 2019-09-16 MED ORDER — LIDOCAINE HCL 1 % IJ SOLN
INTRAMUSCULAR | Status: AC
  Filled 2019-09-16: qty 60

## 2019-09-16 MED ORDER — FENTANYL CITRATE (PF) 100 MCG/2ML IJ SOLN
INTRAMUSCULAR | Status: DC | PRN
  Administered 2019-09-16 (×3): 25 ug via INTRAVENOUS

## 2019-09-16 MED ORDER — ACETAMINOPHEN 325 MG PO TABS
325.0000 mg | ORAL_TABLET | ORAL | Status: DC | PRN
  Filled 2019-09-16: qty 2

## 2019-09-16 MED ORDER — HEPARIN (PORCINE) IN NACL 1000-0.9 UT/500ML-% IV SOLN
INTRAVENOUS | Status: DC | PRN
  Administered 2019-09-16: 500 mL

## 2019-09-16 MED ORDER — SODIUM CHLORIDE 0.9 % IV SOLN: INTRAVENOUS | Status: DC

## 2019-09-16 MED ORDER — PANTOPRAZOLE SODIUM 20 MG PO TBEC
20.0000 mg | DELAYED_RELEASE_TABLET | Freq: Every day | ORAL | Status: DC
  Administered 2019-09-16: 20 mg via ORAL
  Filled 2019-09-16 (×2): qty 1

## 2019-09-16 MED ORDER — ONDANSETRON HCL 4 MG/2ML IJ SOLN: 4.0000 mg | Freq: Four times a day (QID) | INTRAMUSCULAR | Status: DC | PRN

## 2019-09-16 MED ORDER — SODIUM CHLORIDE 0.9 % IV SOLN
80.0000 mg | INTRAVENOUS | Status: AC
  Administered 2019-09-16: 80 mg
  Filled 2019-09-16: qty 2

## 2019-09-16 MED ORDER — CHLORHEXIDINE GLUCONATE 4 % EX LIQD
4.0000 "application " | Freq: Once | CUTANEOUS | Status: DC
  Filled 2019-09-16: qty 60

## 2019-09-16 MED ORDER — FENTANYL CITRATE (PF) 100 MCG/2ML IJ SOLN
INTRAMUSCULAR | Status: AC
  Filled 2019-09-16: qty 2

## 2019-09-16 MED ORDER — MIDAZOLAM HCL 5 MG/5ML IJ SOLN
INTRAMUSCULAR | Status: AC
  Filled 2019-09-16: qty 5

## 2019-09-16 MED ORDER — SODIUM CHLORIDE 0.9 % IV SOLN
INTRAVENOUS | Status: AC
  Filled 2019-09-16: qty 2

## 2019-09-16 SURGICAL SUPPLY — 9 items
CABLE SURGICAL S-101-97-12 (CABLE) ×2 IMPLANT
IPG PACE AZUR XT DR MRI W1DR01 (Pacemaker) ×1 IMPLANT
LEAD CAPSURE NOVUS 45CM (Lead) ×2 IMPLANT
LEAD CAPSURE NOVUS 5076-52CM (Lead) ×2 IMPLANT
PACE AZURE XT DR MRI W1DR01 (Pacemaker) ×2 IMPLANT
PAD PRO RADIOLUCENT 2001M-C (PAD) ×2 IMPLANT
SHEATH 7FR PRELUDE SNAP 13 (SHEATH) ×4 IMPLANT
SHEATH PROBE COVER 6X72 (BAG) ×2 IMPLANT
TRAY PACEMAKER INSERTION (PACKS) ×2 IMPLANT

## 2019-09-16 NOTE — Discharge Instructions (Signed)

## 2019-09-16 NOTE — Progress Notes (Signed)
Dr Quentin Ore notified of pt's continued high blood pressure. Orders received for home med which was given

## 2019-09-16 NOTE — Interval H&P Note (Signed)
History and Physical Interval Note:  09/16/2019 10:28 AM  Bobby Woods  has presented today for surgery, with the diagnosis of bradicardia.  The various methods of treatment have been discussed with the patient and family. After consideration of risks, benefits and other options for treatment, the patient has consented to  Procedure(s): PACEMAKER IMPLANT (N/A) as a surgical intervention.  The patient's history has been reviewed, patient examined, no change in status, stable for surgery.  I have reviewed the patient's chart and labs.  Questions were answered to the patient's satisfaction.     Marine Lezotte T Marcene Laskowski

## 2019-09-17 ENCOUNTER — Telehealth: Payer: Self-pay | Admitting: Cardiology

## 2019-09-17 NOTE — Telephone Encounter (Signed)
Patient called stating that he had a pacemaker placed yesterday and now is complaining that after taking the bandage off he noticed that below the incision it is draining clear fluid and is very sore.  The patient's daughter took a picture of the place and tesxted it to me.  The area he is referring to is several inches below his incision and is where the tape from his bandage was.  It appears that when he pulled the bandage with tape off, he pulled off some skin and may have a mild tape skin reaction.  I told him it was fine to place some neosporin on it.  The actual pacer wound is clean and dry and looks good.

## 2019-09-19 MED FILL — Lidocaine HCl Local Inj 1%: INTRAMUSCULAR | Qty: 60 | Status: AC

## 2019-09-20 ENCOUNTER — Telehealth: Payer: Self-pay | Admitting: Cardiology

## 2019-09-20 ENCOUNTER — Encounter (HOSPITAL_COMMUNITY): Payer: Self-pay | Admitting: Cardiology

## 2019-09-20 NOTE — Telephone Encounter (Signed)
STAT if patient feels like he/she is going to faint   1) Are you dizzy now? No - had an episode today at 3pm that lasted a few minutes   2) Do you feel faint or have you passed out? no  3) Do you have any other symptoms? Heart quivering   4) Have you checked your HR and BP (record if available)? No, BP machine needs batteries

## 2019-09-21 MED ORDER — AMLODIPINE BESYLATE 5 MG PO TABS: 5.0000 mg | ORAL_TABLET | Freq: Every day | ORAL | Status: DC

## 2019-09-21 NOTE — Telephone Encounter (Signed)
Patient returning call. Declined DOD appt at this time as dizziness has not continued.  Patient states he thinks he may be having bp issues and will go to the drug store after the storm to check it.    Patient states he will call back to report pressure and if he wants to be seen.

## 2019-09-21 NOTE — Telephone Encounter (Signed)
Pt c/o BP issue: STAT if pt c/o blurred vision, one-sided weakness or slurred speech  1. What are your last 5 BP readings?   Today at ems station - 190/92   2. Are you having any other symptoms (ex. Dizziness, headache, blurred vision, passed out)? Episode of dizziness yesterday see below   3. What is your BP issue? Dizziness concerned about bp

## 2019-09-21 NOTE — Addendum Note (Signed)
Addended by: Alvis Lemmings C on: 09/21/2019 02:20 PM   Modules accepted: Orders

## 2019-09-21 NOTE — Telephone Encounter (Signed)
11:07 am phone call noted. I will close encounter for now.

## 2019-09-21 NOTE — Telephone Encounter (Signed)
Left message to call back to discuss on both numbers listed.  May add on to DOD slot today if patient calls back and is still needed.

## 2019-09-21 NOTE — Telephone Encounter (Signed)
The patient called back this afternoon with an elevated BP reading and the call was transferred directly to triage. Per the patient, he had his PPM placed on 09/16/19 with Dr. Quentin Ore. He did not take any BP meds that day as instructed.  His SBP was 170-190s while in the hospital, but Dr. Quentin Ore felt this was due to holding his meds pre-procedure.  The patient had seen Dr. Garen Lah on 08/22/19 and his amlodipine was stopped due to a HR of 51 bpm. He was advised to continue metoprolol succinate 25 mg once daily.   The patient reports he walked to the mailbox yesterday and after getting back to his garage he became extremely dizzy.  He went inside and his wife checked his BP. He was 199/110.  He has felt ok today, but had a friend at EMS check his BP this afternoon and he was still 190/92. He took metoprolol succinate 25 mg x 1 dose this morning ~ 11:15 am.  The patient states he still has amlodipine at home.  I advised I would review with Dr. Garen Lah and call him back.   I reviewed the above BP readings with Dr. Garen Lah. Per MD recommendations, the patient should resume amlodipine 5 mg once daily (he has this at home) and continue to monitor his BP.  I have notified the patient of Dr. Thereasa Solo recommendations. He is advised to take amlodipine 5 mg x 1 dose now. He is aware he can take this again in the morning with his metoprolol. I have asked that he continue to monitor his BP this evening, tomorrow, and Friday morning after taking his medications. He is aware to call the office back on Friday if his BP readings are still elevated so we may obtain further MD recommendations prior to the weekend.  The patient voices understanding and is agreeable.

## 2019-09-23 NOTE — Telephone Encounter (Signed)
Patient calling in wanting to report BP 9/9 159/69  69 9/10 137/77  63  States that is a lot better than the 190's he was having

## 2019-09-27 ENCOUNTER — Other Ambulatory Visit: Payer: Self-pay

## 2019-09-27 ENCOUNTER — Ambulatory Visit (INDEPENDENT_AMBULATORY_CARE_PROVIDER_SITE_OTHER): Payer: Medicare PPO | Admitting: Emergency Medicine

## 2019-09-27 DIAGNOSIS — I495 Sick sinus syndrome: Secondary | ICD-10-CM

## 2019-09-27 DIAGNOSIS — Z950 Presence of cardiac pacemaker: Secondary | ICD-10-CM

## 2019-09-30 ENCOUNTER — Other Ambulatory Visit: Payer: Self-pay

## 2019-09-30 ENCOUNTER — Ambulatory Visit: Payer: Medicare PPO | Admitting: Cardiology

## 2019-09-30 ENCOUNTER — Encounter: Payer: Self-pay | Admitting: Cardiology

## 2019-09-30 VITALS — BP 132/80 | HR 74 | Ht 64.0 in | Wt 175.0 lb

## 2019-09-30 DIAGNOSIS — R0600 Dyspnea, unspecified: Secondary | ICD-10-CM

## 2019-09-30 DIAGNOSIS — R0609 Other forms of dyspnea: Secondary | ICD-10-CM

## 2019-09-30 DIAGNOSIS — I10 Essential (primary) hypertension: Secondary | ICD-10-CM

## 2019-09-30 DIAGNOSIS — I495 Sick sinus syndrome: Secondary | ICD-10-CM

## 2019-09-30 NOTE — Progress Notes (Signed)
Cardiology Office Note:    Date:  09/30/2019   ID:  Bobby Woods, DOB Apr 26, 1945, MRN [id number]  PCP:  Venia Carbon, MD  [facility] Cardiologist:  [name], MD  [facility] Electrophysiologist:  Vickie Epley, MD   Referring MD: Venia Carbon, MD   Chief Complaint  Patient presents with  . Follow-up    1 Month follow up and review test results. Medications verbally reviewed with patient.     History of Present Illness:    Bobby Woods is a 74 y.o. male with a hx of GERD, SSS s/p PPM, hypertension who presents for follow-up.  Patient was previously seen due to palpitations, cardiac monitor showed paroxysmal SVTs and occasional sinus pauses.  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  Underwent dual-chamber pacemaker placement on 09/16/2019.  He states feeling much better since pacemaker placement.  Denies further episodes of palpitations.  Restarted amlodipine 5 mg daily due to elevated blood pressures.  Blood pressures have been better controlled.  Prior notes Echocardiogram performed on 07/23/2019 showed normal systolic and diastolic function, EF 60 to 65%.    Past Medical History:  Diagnosis Date  . Arthritis   . Blood transfusion without reported diagnosis    age 18 year   . BPH (benign prostatic hypertrophy)   . Chronic kidney disease   . GERD (gastroesophageal reflux disease)   . Hypertension    on medicines     Past Surgical History:  Procedure Laterality Date  . COLONOSCOPY  2006  . HAND SURGERY    . [place]  . PACEMAKER IMPLANT N/A 09/16/2019   Procedure: PACEMAKER IMPLANT;  Surgeon: Vickie Epley, MD;  Location: [facility];  Service: Cardiovascular;  Laterality: N/A;  . PROSTATE BIOPSY  2011   negative  . [facility]   left, right 05/07  . ROTATOR CUFF REPAIR  10/09   right  . TONSILLECTOMY  1972    Current Medications: Current  Meds  Medication Sig  . amLODipine (NORVASC) 5 MG tablet Take 1 tablet (5 mg total) by mouth daily.  Marland Kitchen aspirin 81 MG tablet Take 81 mg by mouth every other day.   . finasteride (PROSCAR) 5 MG tablet TAKE 1 TABLET BY MOUTH EVERY DAY (Patient taking differently: Take 5 mg by mouth daily. )  . meloxicam (MOBIC) 15 MG tablet Take 1 tablet (15 mg total) by mouth daily. (Patient taking differently: Take 15 mg by mouth daily as needed for pain. )  . metoprolol succinate (TOPROL-XL) 25 MG 24 hr tablet Take 25 mg by mouth daily.  Marland Kitchen omeprazole (PRILOSEC) 20 MG capsule TAKE 1 CAPSULE BY MOUTH EVERY DAY (Patient taking differently: Take 20 mg by mouth daily. )  . tiZANidine (ZANAFLEX) 2 MG tablet TAKE 1 TO 2 TABLETS BY MOUTH AT BEDTIME AS NEEDED FOR MUSCLE SPASMS (Patient taking differently: Take 2-4 mg by mouth at bedtime as needed for muscle spasms. )     Allergies:   Zithromax [azithromycin dihydrate]   Social History   Socioeconomic History  . Marital status: Married    Spouse name: Not on file  . Number of children: 3  . Years of education: Not on file  . Highest education level: Not on file  Occupational History  . Occupation: Psychologist, prison and probation services   Retired 8/13    Employer: [employer]  . Occupation: Programme researcher, broadcasting/film/video: [employer]    Comment:    . Occupation: Custodial/funeral assistance    Comment: [employer]  Tobacco Use  . Smoking status: Never Smoker  . Smokeless tobacco: Never Used  Vaping Use  . Vaping Use: Never used  Substance and Sexual Activity  . Alcohol use: No  . Drug use: No  . Sexual activity: Not on file  Other Topics Concern  . Not on file  Social History Narrative   Has living will   Wife has [facility] health care POA and alternate is [name] (cousin)   Would accept attempts at resuscitation but no prolonged artificial ventilation   Not sure about a feeding tube   Social Determinants of Health   Financial Resource Strain:   .  Difficulty of Paying Living Expenses: Not on file  Food Insecurity:   . Worried About Charity fundraiser in the Last Year: Not on file  . Ran Out of Food in the Last Year: Not on file  Transportation Needs:   . Lack of Transportation (Medical): Not on file  . Lack of Transportation (Non-Medical): Not on file  Physical Activity:   . Days of Exercise per Week: Not on file  . Minutes of Exercise per Session: Not on file  Stress:   . Feeling of Stress : Not on file  Social Connections:   . Frequency of Communication with Friends and Family: Not on file  . Frequency of Social Gatherings with Friends and Family: Not on file  . Attends Religious Services: Not on file  . Active Member of Clubs or Organizations: Not on file  . Attends Archivist Meetings: Not on file  . Marital Status: Not on file     Family History: The patient's family history includes Cancer in his father and paternal uncle. There is no history of Colon cancer, Rectal cancer, or Stomach cancer.  ROS:   Please see the history of present illness.     All other systems reviewed and are negative.  EKGs/Labs/Other Studies Reviewed:    The following studies were reviewed today:   EKG:  EKG not ordered today.    Recent Labs: 06/16/2019: ALT 19 08/24/2019: BUN 17; Creatinine, Ser 1.23; Hemoglobin 15.6; Platelets 182; Potassium 4.1; Sodium 140  Recent Lipid Panel    Component Value Date/Time   CHOL 106 07/20/2018 0959   TRIG 51.0 07/20/2018 0959   HDL 48.00 07/20/2018 0959   CHOLHDL 2 07/20/2018 0959   VLDL 10.2 07/20/2018 0959   LDLCALC 48 07/20/2018 0959    Physical Exam:    VS:  BP 132/80 (BP Location: Left Arm, Patient Position: Sitting, Cuff Size: Normal)   Pulse 74   Ht 5\' 4"  (1.626 m)   Wt 175 lb (79.4 kg)   SpO2 97%   BMI 30.04 kg/m     Wt Readings from Last 3 Encounters:  09/30/19 175 lb (79.4 kg)  09/16/19 172 lb (78 kg)  08/24/19 179 lb (81.2 kg)     GEN:  Well nourished, well  developed in no acute distress HEENT: Normal NECK: No JVD; No carotid bruits LYMPHATICS: No lymphadenopathy CARDIAC: RRR, no murmurs, rubs, gallops RESPIRATORY:  Clear to auscultation without rales, wheezing or rhonchi  ABDOMEN: Soft, non-tender, non-distended MUSCULOSKELETAL:  No edema; No deformity, chest tenderness noted on palpation. SKIN: Warm and dry NEUROLOGIC:  Alert and oriented x 3 PSYCHIATRIC:  Normal affect   ASSESSMENT:    1. Tachycardia-bradycardia syndrome ([place])   2. Dyspnea  on exertion   3. Essential hypertension    PLAN:    In order of problems listed above:  1. 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  Currently denies palpitations.  States symptoms have improved since pacemaker was placed 2 weeks ago.  Continue Toprol-XL 25 mg daily for now.  If he has further episodes of palpitations, we will titrate beta-blocker as needed.  Keep appointments with device clinic for pacemaker check. 2. Patient states having dyspnea and fatigue.  Echocardiogram shows normal systolic and diastolic function, EF 60 to 65%.  Coronary CTA showed a calcium score of 0, no evidence for CAD.  Patient made aware of results and reassured. 3. History of hypertension, blood pressure controlled.  Continue Toprol-XL, amlodipine.  Follow-up in 6 months.  Total encounter time 40 minutes  Greater than 50% was spent in counseling and coordination of care with the patient   This note was generated in part or whole with voice recognition software. Voice recognition is usually quite accurate but there are transcription errors that can and very often do occur. I apologize for any typographical errors that were not detected and corrected.  Medication Adjustments/Labs and Tests Ordered: Current medicines are reviewed at length with the patient today.  Concerns regarding medicines are outlined above.  No orders of the defined  types were placed in this encounter.  No orders of the defined types were placed in this encounter.   Patient Instructions  Medication Instructions:  Your physician recommends that you continue on your current medications as directed. Please refer to the Current Medication list given to you today.  *If you need a refill on your cardiac medications before your next appointment, please call your pharmacy*   Lab Work: None Ordered If you have labs (blood work) drawn today and your tests are completely normal, you will receive your results only by: Marland Kitchen MyChart Message (if you have MyChart) OR . A paper copy in the mail If you have any lab test that is abnormal or we need to change your treatment, we will call you to review the results.   Testing/Procedures: None Ordered   Follow-Up: At [facility], you and your health needs are our priority.  As part of our continuing mission to provide you with exceptional heart care, we have created designated Provider Care Teams.  These Care Teams include your primary Cardiologist (physician) and Advanced Practice Providers (APPs -  Physician Assistants and Nurse Practitioners) who all work together to provide you with the care you need, when you need it.  We recommend signing up for the patient portal called "MyChart".  Sign up information is provided on this After Visit Summary.  MyChart is used to connect with patients for Virtual Visits (Telemedicine).  Patients are able to view lab/test results, encounter notes, upcoming appointments, etc.  Non-urgent messages can be sent to your provider as well.   To learn more about what you can do with MyChart, go to [email or web address].    Your next appointment:   6 month(s)  The format for your next appointment:   In Person  Provider:   [name], MD   Other Instructions      Signed, [name], MD  09/30/2019 1:10 PM    [place]

## 2019-09-30 NOTE — Patient Instructions (Signed)

## 2019-10-01 LAB — CUP PACEART INCLINIC DEVICE CHECK
Battery Remaining Longevity: 147 mo
Battery Voltage: 3.22 V
Brady Statistic AP VP Percent: 0.04 %
Brady Statistic AP VS Percent: 77.21 %
Brady Statistic AS VP Percent: 0.01 %
Brady Statistic AS VS Percent: 22.75 %
Brady Statistic RA Percent Paced: 77.29 %
Brady Statistic RV Percent Paced: 0.04 %
Date Time Interrogation Session: 20210914191157
Implantable Lead Implant Date: 20210903
Implantable Lead Implant Date: 20210903
Implantable Lead Location: 753859
Implantable Lead Location: 753860
Implantable Lead Model: 5076
Implantable Lead Model: 5076
Implantable Pulse Generator Implant Date: 20210903
Lead Channel Impedance Value: 361 Ohm
Lead Channel Impedance Value: 437 Ohm
Lead Channel Impedance Value: 437 Ohm
Lead Channel Impedance Value: 494 Ohm
Lead Channel Pacing Threshold Amplitude: 0.5 V
Lead Channel Pacing Threshold Amplitude: 0.75 V
Lead Channel Pacing Threshold Pulse Width: 0.4 ms
Lead Channel Pacing Threshold Pulse Width: 0.4 ms
Lead Channel Sensing Intrinsic Amplitude: 2 mV
Lead Channel Sensing Intrinsic Amplitude: 8.375 mV
Lead Channel Setting Pacing Amplitude: 3.5 V
Lead Channel Setting Pacing Amplitude: 3.5 V
Lead Channel Setting Pacing Pulse Width: 0.4 ms
Lead Channel Setting Sensing Sensitivity: 1.2 mV

## 2019-10-01 NOTE — Progress Notes (Signed)
Wound check appointment. Steri-strips removed. Wound without redness or edema. Incision edges approximated, wound well healed. Normal device function. Thresholds, sensing, and impedances consistent with implant measurements. Device programmed at 3.5V with auto capture on for extra safety margin until 3 month visit. Histogram distribution appropriate for patient and level of activity. No mode switches. 1 high ventricular rate appears atrially-driven. Occasional TWOS noted after VP events, RV sensitivity reduced to 1.33mV to cover. RA/RV lead monitor reprogrammed to adaptive, RA/RV high threshold alerts enabled. Patient educated about wound care, arm mobility, lifting restrictions, and Carelink monitor. Carelink on 12/16/19 and ROV with Dr. Quentin Ore in [name] on 12/21/19.

## 2019-10-07 ENCOUNTER — Telehealth: Payer: Self-pay | Admitting: Cardiology

## 2019-10-07 NOTE — Telephone Encounter (Signed)
Spoke with patient and he has been concerned about feeling dizzy over the last 2 days. His VS today after walking to the mailbox were: BP 158/88 HR 88, sitting BP 155/98  HR 70.    He stated he did drink a powerade after coming in as he had not been drinking much fluid.   I spoke with Dr. Garen Lah, and after reviewing his chart he did not feel the dizziness was cardiac in nature. He recommended that the patient continue to monitor his BP and if it remains elevated, we can consider increasing his Norvasc to 10mg  daily. I relayed the advice to the patient and he verbalized understanding and agreed with plan. He stated that if his BP continues to be elevated he will inform us to discuss the medication change.

## 2019-10-07 NOTE — Telephone Encounter (Signed)
STAT if patient feels like he/she is going to faint   1) Are you dizzy now? Yes when standing since yesterday worse today   2) Do you feel faint or have you passed out?  Not sure if faint just feels dizzy  denies LOC   3) Do you have any other symptoms? no  4) Have you checked your HR and BP (record if available)? 158/88 hr 88 sitting 155/98 hr 70

## 2019-10-10 ENCOUNTER — Telehealth: Payer: Self-pay | Admitting: Internal Medicine

## 2019-10-10 NOTE — Telephone Encounter (Signed)
Spoke with patient and reiterated that Dr. Garen Lah does not think his symptoms are cardiac related. Patient stated that he would be calling his PCP. He is up urinating 5-6 times a night and is still feeling dizzy throughout the day.  Patient was grateful for the callback and will notify us if PCP thinks he needs to for any further follow up regarding his status.

## 2019-10-10 NOTE — Telephone Encounter (Signed)
Patient was transferred by access nurse with urinary frequency.  Patient said he has been dizzy on and off since Friday.  Bobby Woods said she'd call patient later this afternoon to check on patient.

## 2019-10-10 NOTE — Telephone Encounter (Signed)
Friday PM 149/100 153/102 Saturday    150/87 Sunday       139/90  Monday       13 6/92- no medicine at this point  Friday pt had trouble with dizziness that continued through the weekend. Patient still doesn't feel great. Patient usually wakes up once through the night to use bathroom, last night it was 5-6x, patient is calling PCP regarding that

## 2019-10-10 NOTE — Telephone Encounter (Signed)
Spoke to pt. Canceled his appt with [facility] Silversmith tomorrow and put him in with Dr Silvio Pate as he is having more than 1 issue

## 2019-10-10 NOTE — Telephone Encounter (Signed)
If no pain or fever, may just be enlarged prostate. I can add him on at end of my schedule tomorrow if he needs a visit (noon)

## 2019-10-10 NOTE — Telephone Encounter (Signed)
[facility] - Client TELEPHONE ADVICE RECORD [facility] Patient Name: Bobby Woods Gender: Male DOB: [birth year] Age: 74 Y 11 M 3 D Return Phone Number: [phone] (Primary), [phone] (Secondary) Address: City/State/Zip: [place] [address] Client [facility] - Client Client Site [facility] - Day Physician Viviana Simpler- MD Contact Type Call Who Is Calling Patient / Member / Family / Caregiver Call Type Triage / Clinical Relationship To Patient Self Return Phone Number [phone] (Secondary) Chief Complaint Dizziness Reason for Call Symptomatic / Request for Health Information Initial Comment Caller states dizzy-comes and goes/could hardly walk Fri; better Sat; BP was up Fri night; last night had to go to rr 5x during night; pacemaker put in 3-4 wks ago; cardio adv to call pcp; Translation No Nurse Assessment Nurse: Windle Guard, RN, Lesa Date/Time (Eastern Time): 10/10/2019 12:26:16 PM Confirm and document reason for call. If symptomatic, describe symptoms. ---Caller states he has frequent urination with dizziness Does the patient have any new or worsening symptoms? ---Yes Will a triage be completed? ---Yes Related visit to physician within the last 2 weeks? ---No Does the PT have any chronic conditions? (i.e. diabetes, asthma, this includes High risk factors for pregnancy, etc.) ---Yes List chronic conditions. ---pacemaker, HTN, Is this a behavioral health or substance abuse call? ---No Guidelines Guideline Title Affirmed Question Affirmed Notes Nurse Date/Time (Eastern Time) Urinary Symptoms Urinating more frequently than usual (i.e., frequency) Conner, RN, Emmaline Kluver 10/10/2019 12:29:18 PM Disp. Time Eilene Ghazi Time) Disposition Final User 10/10/2019 12:31:40 PM See PCP within 24 Hours Yes Conner, RN, Emmaline Kluver Caller Disagree/Comply Comply Caller Understands Yes PLEASE NOTE: All timestamps contained  within this report are represented as [place] Standard Time. CONFIDENTIALTY NOTICE: This fax transmission is intended only for the addressee. It contains information that is legally privileged, confidential or otherwise protected from use or disclosure. If you are not the intended recipient, you are strictly prohibited from reviewing, disclosing, copying using or disseminating any of this information or taking any action in reliance on or regarding this information. If you have received this fax in error, please notify us immediately by telephone so that we can arrange for its return to [place]. Phone: [phone], Toll-Free: [phone], Fax: [phone] Page: 2 of 2 Call Id: [id number] PreDisposition Did not know what to do Care Advice Given Per Guideline SEE PCP WITHIN 24 HOURS: * IF OFFICE WILL BE OPEN: You need to be examined within the next 24 hours. Call your doctor (or NP/PA) when the office opens and make an appointment. CALL BACK IF: * Fever occurs * Unable to urinate and bladder feels full * You become worse CARE ADVICE given per Urinary Symptoms (Adult) guideline. Referrals REFERRED TO PCP OFFICE

## 2019-10-11 ENCOUNTER — Ambulatory Visit: Payer: Medicare PPO | Admitting: Internal Medicine

## 2019-10-11 ENCOUNTER — Emergency Department: Payer: Medicare PPO

## 2019-10-11 ENCOUNTER — Other Ambulatory Visit: Payer: Self-pay

## 2019-10-11 ENCOUNTER — Encounter: Payer: Self-pay | Admitting: Internal Medicine

## 2019-10-11 ENCOUNTER — Ambulatory Visit (INDEPENDENT_AMBULATORY_CARE_PROVIDER_SITE_OTHER): Payer: Medicare PPO | Admitting: Internal Medicine

## 2019-10-11 ENCOUNTER — Encounter: Payer: Self-pay | Admitting: *Deleted

## 2019-10-11 VITALS — BP 128/84 | HR 55 | Temp 98.0°F | Ht 64.0 in | Wt 173.0 lb

## 2019-10-11 DIAGNOSIS — R350 Frequency of micturition: Secondary | ICD-10-CM

## 2019-10-11 DIAGNOSIS — R270 Ataxia, unspecified: Secondary | ICD-10-CM | POA: Diagnosis not present

## 2019-10-11 DIAGNOSIS — Z043 Encounter for examination and observation following other accident: Secondary | ICD-10-CM | POA: Diagnosis not present

## 2019-10-11 DIAGNOSIS — Z950 Presence of cardiac pacemaker: Secondary | ICD-10-CM | POA: Insufficient documentation

## 2019-10-11 DIAGNOSIS — Z79899 Other long term (current) drug therapy: Secondary | ICD-10-CM | POA: Insufficient documentation

## 2019-10-11 DIAGNOSIS — I10 Essential (primary) hypertension: Secondary | ICD-10-CM

## 2019-10-11 DIAGNOSIS — W19XXXA Unspecified fall, initial encounter: Secondary | ICD-10-CM | POA: Insufficient documentation

## 2019-10-11 DIAGNOSIS — Y9389 Activity, other specified: Secondary | ICD-10-CM | POA: Insufficient documentation

## 2019-10-11 DIAGNOSIS — S02401A Maxillary fracture, unspecified, initial encounter for closed fracture: Secondary | ICD-10-CM | POA: Insufficient documentation

## 2019-10-11 DIAGNOSIS — Y9289 Other specified places as the place of occurrence of the external cause: Secondary | ICD-10-CM | POA: Diagnosis not present

## 2019-10-11 DIAGNOSIS — R42 Dizziness and giddiness: Secondary | ICD-10-CM | POA: Insufficient documentation

## 2019-10-11 DIAGNOSIS — S0240DA Maxillary fracture, left side, initial encounter for closed fracture: Secondary | ICD-10-CM | POA: Diagnosis not present

## 2019-10-11 DIAGNOSIS — N401 Enlarged prostate with lower urinary tract symptoms: Secondary | ICD-10-CM

## 2019-10-11 DIAGNOSIS — Z7982 Long term (current) use of aspirin: Secondary | ICD-10-CM | POA: Diagnosis not present

## 2019-10-11 DIAGNOSIS — N138 Other obstructive and reflux uropathy: Secondary | ICD-10-CM | POA: Diagnosis not present

## 2019-10-11 DIAGNOSIS — S0990XA Unspecified injury of head, initial encounter: Secondary | ICD-10-CM | POA: Diagnosis present

## 2019-10-11 LAB — CBC
HCT: 43.7 % (ref 39.0–52.0)
Hemoglobin: 16 g/dL (ref 13.0–17.0)
MCH: 32.3 pg (ref 26.0–34.0)
MCHC: 36.6 g/dL — ABNORMAL HIGH (ref 30.0–36.0)
MCV: 88.1 fL (ref 80.0–100.0)
Platelets: 199 10*3/uL (ref 150–400)
RBC: 4.96 MIL/uL (ref 4.22–5.81)
RDW: 12.4 % (ref 11.5–15.5)
WBC: 14.8 10*3/uL — ABNORMAL HIGH (ref 4.0–10.5)
nRBC: 0 % (ref 0.0–0.2)

## 2019-10-11 LAB — POC URINALSYSI DIPSTICK (AUTOMATED)
Bilirubin, UA: NEGATIVE
Blood, UA: NEGATIVE
Glucose, UA: NEGATIVE
Ketones, UA: NEGATIVE
Leukocytes, UA: NEGATIVE
Nitrite, UA: NEGATIVE
Protein, UA: NEGATIVE
Spec Grav, UA: 1.015 (ref 1.010–1.025)
Urobilinogen, UA: 0.2 E.U./dL
pH, UA: 6 (ref 5.0–8.0)

## 2019-10-11 LAB — BASIC METABOLIC PANEL
Anion gap: 11 (ref 5–15)
BUN: 17 mg/dL (ref 8–23)
CO2: 24 mmol/L (ref 22–32)
Calcium: 9.4 mg/dL (ref 8.9–10.3)
Chloride: 105 mmol/L (ref 98–111)
Creatinine, Ser: 1.08 mg/dL (ref 0.61–1.24)
GFR calc Af Amer: >60 mL/min (ref >60)
GFR calc non Af Amer: >60 mL/min (ref >60)
Glucose, Bld: 118 mg/dL — ABNORMAL HIGH (ref 70–99)
Potassium: 4.2 mmol/L (ref 3.5–5.1)
Sodium: 140 mmol/L (ref 135–145)

## 2019-10-11 LAB — TROPONIN I (HIGH SENSITIVITY): Troponin I (High Sensitivity): 6 ng/L (ref <18)

## 2019-10-11 MED ORDER — TAMSULOSIN HCL 0.4 MG PO CAPS: 0.4000 mg | ORAL_CAPSULE | Freq: Every day | ORAL | 3 refills | Status: DC

## 2019-10-11 NOTE — Progress Notes (Signed)
Subjective:    Patient ID: Bobby Woods, male    DOB: May 30, 1945, 74 y.o.   MRN: [id number]  HPI Here due to urinary problems and dizziness With wife This visit occurred during the SARS-CoV-2 public health emergency.  Safety protocols were in place, including screening questions prior to the visit, additional usage of staff PPE, and extensive cleaning of exam room while observing appropriate contact time as indicated for disinfecting solutions.   Woke up 4 days ago with dizziness Noticed when he stood up. Not vertigo---was staggering BP was up--called cardiology since he has just had pacemaker put in Stayed all that day---and he spent most of the day in bed Next day---it became episodic. Like first in the morning--then spells at various times during the day  BP still up ---especially if active Diastolic often over 808---[id number] with rest Systolic as high as 458'P and often 160  Current Outpatient Medications on File Prior to Visit  Medication Sig Dispense Refill  . amLODipine (NORVASC) 5 MG tablet Take 1 tablet (5 mg total) by mouth daily.    Marland Kitchen aspirin 81 MG tablet Take 81 mg by mouth every other day.     . finasteride (PROSCAR) 5 MG tablet TAKE 1 TABLET BY MOUTH EVERY DAY (Patient taking differently: Take 5 mg by mouth daily. ) 90 tablet 3  . meloxicam (MOBIC) 15 MG tablet Take 1 tablet (15 mg total) by mouth daily. (Patient taking differently: Take 15 mg by mouth daily as needed for pain. ) 30 tablet 1  . metoprolol succinate (TOPROL-XL) 25 MG 24 hr tablet Take 25 mg by mouth daily.    Marland Kitchen omeprazole (PRILOSEC) 20 MG capsule TAKE 1 CAPSULE BY MOUTH EVERY DAY (Patient taking differently: Take 20 mg by mouth daily. ) 90 capsule 3  . tiZANidine (ZANAFLEX) 2 MG tablet TAKE 1 TO 2 TABLETS BY MOUTH AT BEDTIME AS NEEDED FOR MUSCLE SPASMS (Patient taking differently: Take 2-4 mg by mouth at bedtime as needed for muscle spasms. ) 180 tablet 1   No current facility-administered medications  on file prior to visit.    Allergies  Allergen Reactions  . Zithromax [Azithromycin Dihydrate]     GI upset    Past Medical History:  Diagnosis Date  . Arthritis   . Blood transfusion without reported diagnosis    age 7 year   . BPH (benign prostatic hypertrophy)   . Chronic kidney disease   . GERD (gastroesophageal reflux disease)   . Hypertension    on medicines     Past Surgical History:  Procedure Laterality Date  . COLONOSCOPY  2006  . HAND SURGERY    . [place]  . PACEMAKER IMPLANT N/A 09/16/2019   Procedure: PACEMAKER IMPLANT;  Surgeon: Vickie Epley, MD;  Location: [facility];  Service: Cardiovascular;  Laterality: N/A;  . PROSTATE BIOPSY  2011   negative  . [place]   left, right 05/07  . ROTATOR CUFF REPAIR  10/09   right  . TONSILLECTOMY  1972    Family History  Problem Relation Age of Onset  . Cancer Father        prostate  . Cancer Paternal Uncle        prostate cancer  . Colon cancer Neg Hx   . Rectal cancer Neg Hx   . Stomach cancer Neg Hx     Social History   Socioeconomic History  . Marital status: Married  Spouse name: Not on file  . Number of children: 3  . Years of education: Not on file  . Highest education level: Not on file  Occupational History  . Occupation: Psychologist, prison and probation services   Retired 8/13    Employer: [employer]  . Occupation: Quality assurance--cleaning    Employer: [employer]    Comment:    . Occupation: Custodial/funeral assistance    Comment: Soldier  Tobacco Use  . Smoking status: Never Smoker  . Smokeless tobacco: Never Used  Vaping Use  . Vaping Use: Never used  Substance and Sexual Activity  . Alcohol use: No  . Drug use: No  . Sexual activity: Not on file  Other Topics Concern  . Not on file  Social History Narrative   Has living will   Wife has [facility] care POA and alternate is [name] (cousin)   Would accept attempts at  resuscitation but no prolonged artificial ventilation   Not sure about a feeding tube   Social Determinants of Health   Financial Resource Strain:   . Difficulty of Paying Living Expenses: Not on file  Food Insecurity:   . Worried About Charity fundraiser in the Last Year: Not on file  . Ran Out of Food in the Last Year: Not on file  Transportation Needs:   . Lack of Transportation (Medical): Not on file  . Lack of Transportation (Non-Medical): Not on file  Physical Activity:   . Days of Exercise per Week: Not on file  . Minutes of Exercise per Session: Not on file  Stress:   . Feeling of Stress : Not on file  Social Connections:   . Frequency of Communication with Friends and Family: Not on file  . Frequency of Social Gatherings with Friends and Family: Not on file  . Attends Religious Services: Not on file  . Active Member of Clubs or Organizations: Not on file  . Attends Archivist Meetings: Not on file  . Marital Status: Not on file  Intimate Partner Violence:   . Fear of Current or Ex-Partner: Not on file  . Emotionally Abused: Not on file  . Physically Abused: Not on file  . Sexually Abused: Not on file    Review of Systems Has fleeting sore feeling in his chest--not where the pacer is Breathing is fine Appetite is good Sleeping okay---but is up a lot at night (as much as 7 times a night) No dysuria    Objective:   Physical Exam Cardiovascular:     Rate and Rhythm: Normal rate and regular rhythm.     Heart sounds: No murmur heard.  No gallop.   Pulmonary:     Effort: Pulmonary effort is normal.     Breath sounds: Normal breath sounds. No wheezing or rales.  Neurological:     Comments: Mildly ataxic gait--wide based No focal weakness  Psychiatric:        Mood and Affect: Mood normal.        Behavior: Behavior normal.            Assessment & Plan:

## 2019-10-11 NOTE — Assessment & Plan Note (Signed)
Much more frequency and nocturia--despite the finasteride Will add tamsulosin

## 2019-10-11 NOTE — Assessment & Plan Note (Signed)
BP Readings from Last 3 Encounters:  10/11/19 128/84  09/30/19 132/80  09/16/19 (!) 141/95   Episodic elevations  May be reactive Will ask him to change amlodipine to nighttime

## 2019-10-11 NOTE — Assessment & Plan Note (Addendum)
Not true dizziness--actually balance/ataxia No vertigo but it could be vestibular---does have horizontal nystagmus with left gaze Will check CT brain (with and without---can't do MRI) On ASA Add statin if has stroke Consider ENT if no stroke--does know Dr Pryor Ochoa Advised him to use cane

## 2019-10-11 NOTE — ED Triage Notes (Signed)
Pt to triage via wheelchair. Pt fell out of his wheelchair onto cement.  Pt has abrasions to face, arms and knees.  No loc  No vomiting no h/a.  Pt reports dizziness.  Pt alert   Speech clear.  Pacemaker placement 09/16/19.

## 2019-10-12 ENCOUNTER — Encounter: Payer: Self-pay | Admitting: Radiology

## 2019-10-12 ENCOUNTER — Emergency Department
Admission: EM | Admit: 2019-10-12 | Discharge: 2019-10-12 | Disposition: A | Discharge status: Home / Self Care | Payer: Medicare PPO | Attending: Emergency Medicine | Admitting: Emergency Medicine

## 2019-10-12 ENCOUNTER — Emergency Department: Payer: Medicare PPO

## 2019-10-12 DIAGNOSIS — R42 Dizziness and giddiness: Secondary | ICD-10-CM

## 2019-10-12 DIAGNOSIS — R270 Ataxia, unspecified: Secondary | ICD-10-CM | POA: Diagnosis not present

## 2019-10-12 DIAGNOSIS — S0240DA Maxillary fracture, left side, initial encounter for closed fracture: Secondary | ICD-10-CM | POA: Diagnosis not present

## 2019-10-12 DIAGNOSIS — Z043 Encounter for examination and observation following other accident: Secondary | ICD-10-CM | POA: Diagnosis not present

## 2019-10-12 DIAGNOSIS — W19XXXA Unspecified fall, initial encounter: Secondary | ICD-10-CM

## 2019-10-12 DIAGNOSIS — S02401A Maxillary fracture, unspecified, initial encounter for closed fracture: Secondary | ICD-10-CM

## 2019-10-12 MED ORDER — IOHEXOL 350 MG/ML SOLN
75.0000 mL | Freq: Once | INTRAVENOUS | Status: AC | PRN
  Administered 2019-10-12: 75 mL via INTRAVENOUS

## 2019-10-12 MED ORDER — CLOPIDOGREL BISULFATE 75 MG PO TABS: 75.0000 mg | ORAL_TABLET | Freq: Every day | ORAL | 2 refills | Status: DC

## 2019-10-12 MED ORDER — MECLIZINE HCL 25 MG PO TABS: 25.0000 mg | ORAL_TABLET | Freq: Three times a day (TID) | ORAL | 0 refills | Status: DC | PRN

## 2019-10-12 MED ORDER — CEPHALEXIN 500 MG PO CAPS: 500.0000 mg | ORAL_CAPSULE | Freq: Two times a day (BID) | ORAL | 0 refills | Status: DC

## 2019-10-12 NOTE — ED Notes (Signed)
Attempted to call wife at number listed to update regarding discharge and for ride home, unsuccessful. Will continue to try

## 2019-10-12 NOTE — Consult Note (Addendum)
Reason for Consult:Dizziness Requesting Physician: Paduchowski  CC: Dizziness  I have been asked by Dr. Kerman Passey to see this patient in consultation for dizziness.  HPI: Bobby Woods is an 74 y.o. male with a history of BPH, HTN, CKD and sick sinus syndrome s/p pacemaker placement who presents after a fall.  Patient reports that he had a pacemaker placed about 4 weeks ago.  10 days after had some dizziness that resolved.  On Friday had recurrence of dizziness that he describes as being off balance.  Was present whenever he got up to walk on Friday.  Since that time it has been intermittent and may occur whether he is sitting or lying down as well.  Episodes last from a few minutes to up to an hour and resolve spontaneously.  On yesterday as sitting in a chair and when he bent down to get something fell out of the chair.   Patient is a somewhat poor historian concerning the details of his dizziness.    Past Medical History:  Diagnosis Date  . Arthritis   . Blood transfusion without reported diagnosis    age 35 year   . BPH (benign prostatic hypertrophy)   . Chronic kidney disease   . GERD (gastroesophageal reflux disease)   . Hypertension    on medicines     Past Surgical History:  Procedure Laterality Date  . COLONOSCOPY  2006  . HAND SURGERY    . [place]  . PACEMAKER IMPLANT N/A 09/16/2019   Procedure: PACEMAKER IMPLANT;  Surgeon: Vickie Epley, MD;  Location: [facility];  Service: Cardiovascular;  Laterality: N/A;  . PROSTATE BIOPSY  2011   negative  . [place]   left, right 05/07  . ROTATOR CUFF REPAIR  10/09   right  . TONSILLECTOMY  1972    Family History  Problem Relation Age of Onset  . Cancer Father        prostate  . Cancer Paternal Uncle        prostate cancer  . Colon cancer Neg Hx   . Rectal cancer Neg Hx   . Stomach cancer Neg Hx     Social History:  reports that he has never smoked. He has never  used smokeless tobacco. He reports that he does not drink alcohol and does not use drugs.  Allergies  Allergen Reactions  . Zithromax [Azithromycin Dihydrate]     GI upset    Medications:  Prior to Admission medications   Medication Sig Start Date End Date Taking? Authorizing Provider  amLODipine (NORVASC) 5 MG tablet Take 1 tablet (5 mg total) by mouth daily. 09/21/19 12/20/19  [name], MD  aspirin 81 MG tablet Take 81 mg by mouth every other day.     [provider]  finasteride (PROSCAR) 5 MG tablet TAKE 1 TABLET BY MOUTH EVERY DAY Patient taking differently: Take 5 mg by mouth daily.  10/01/18   Venia Carbon, MD  meloxicam (MOBIC) 15 MG tablet Take 1 tablet (15 mg total) by mouth daily. Patient taking differently: Take 15 mg by mouth daily as needed for pain.  02/18/19   Edrick Kins, DPM  metoprolol succinate (TOPROL-XL) 25 MG 24 hr tablet Take 25 mg by mouth daily.    [provider]  omeprazole (PRILOSEC) 20 MG capsule TAKE 1 CAPSULE BY MOUTH EVERY DAY Patient taking differently: Take 20 mg by mouth daily.  07/13/19  Venia Carbon, MD  tamsulosin (FLOMAX) 0.4 MG CAPS capsule Take 1 capsule (0.4 mg total) by mouth daily after supper. 10/11/19   Venia Carbon, MD  tiZANidine (ZANAFLEX) 2 MG tablet TAKE 1 TO 2 TABLETS BY MOUTH AT BEDTIME AS NEEDED FOR MUSCLE SPASMS Patient taking differently: Take 2-4 mg by mouth at bedtime as needed for muscle spasms.  12/27/18   Venia Carbon, MD     ROS: History obtained from the patient  General ROS: negative for - chills, fatigue, fever, night sweats, weight gain or weight loss Psychological ROS: negative for - behavioral disorder, hallucinations, memory difficulties, mood swings or suicidal ideation Ophthalmic ROS: negative for - blurry vision, double vision, eye pain or loss of vision ENT ROS: negative for - epistaxis, nasal discharge, oral lesions, sore throat, tinnitus or vertigo Allergy and  Immunology ROS: negative for - hives or itchy/watery eyes Hematological and Lymphatic ROS: bruises from fall Endocrine ROS: negative for - galactorrhea, hair pattern changes, polydipsia/polyuria or temperature intolerance Respiratory ROS: negative for - cough, hemoptysis, shortness of breath or wheezing Cardiovascular ROS: negative for - chest pain, dyspnea on exertion, edema or irregular heartbeat Gastrointestinal ROS: negative for - abdominal pain, diarrhea, hematemesis, nausea/vomiting or stool incontinence Genito-Urinary ROS: negative for - dysuria, hematuria, incontinence or urinary frequency/urgency Musculoskeletal ROS: left shoulder pain Neurological ROS: as noted in HPI Dermatological ROS: negative for rash and skin lesion changes  Physical Examination: Blood pressure (!) 153/100, pulse 61, temperature 98.2 F (36.8 C), temperature source Oral, resp. rate 18, height 5\' 4"  (1.626 m), weight 78.5 kg, SpO2 99 %.  HEENT-  Normocephalic.  Bruises noted on face.   Normal external eye and conjunctiva.   Cardiovascular- S1, S2 normal, pulses palpable throughout   Lungs- chest clear, no wheezing, rales, normal symmetric air entry Abdomen- soft, non-tender; bowel sounds normal; no masses,  no organomegaly Extremities- bruising noted Lymph-no adenopathy palpable Musculoskeletal-no joint tenderness, deformity or swelling Skin-warm and dry, no hyperpigmentation, vitiligo, or suspicious lesions  Neurological Examination   Mental Status: Alert, oriented, thought content appropriate.  Speech fluent without evidence of aphasia.  Able to follow 3 step commands without difficulty. Cranial Nerves: II: Discs flat bilaterally; Visual fields grossly normal, pupils irregular but reactive bilaterally III,IV, VI: ptosis not present, extra-ocular motions intact bilaterally V,VII: smile symmetric, facial light touch sensation normal bilaterally VIII: hearing normal bilaterally IX,X: gag reflex  present XI: bilateral shoulder shrug XII: midline tongue extension Motor: Right : Upper extremity   5/5    Left:     Upper extremity   Limitation due to left shoulder pain  Lower extremity   5/5     Lower extremity   5/5 Tone and bulk:normal tone throughout; no atrophy noted Sensory: Pinprick and light touch intact throughout, bilaterally Deep Tendon Reflexes: Symmetric throughout Plantars: Right: mute   Left: mute Cerebellar: Normal finger-to-nose and normal heel-to-shin testing bilaterally Gait: not tested due to safety concerns    Laboratory Studies:   Basic Metabolic Panel: Recent Labs  Lab 10/11/19 2156  NA 140  K 4.2  CL 105  CO2 24  GLUCOSE 118*  BUN 17  CREATININE 1.08  CALCIUM 9.4    Liver Function Tests: No results for input(s): AST, ALT, ALKPHOS, BILITOT, PROT, ALBUMIN in the last 168 hours. No results for input(s): LIPASE, AMYLASE in the last 168 hours. No results for input(s): AMMONIA in the last 168 hours.  CBC: Recent Labs  Lab 10/11/19 2156  WBC 14.8*  HGB 16.0  HCT 43.7  MCV 88.1  PLT 199    Cardiac Enzymes: No results for input(s): CKTOTAL, CKMB, CKMBINDEX, TROPONINI in the last 168 hours.  BNP: Invalid input(s): POCBNP  CBG: No results for input(s): GLUCAP in the last 168 hours.  Microbiology: Results for orders placed or performed during the hospital encounter of 09/14/19  SARS CORONAVIRUS 2 (TAT 6-24 HRS) Nasopharyngeal Nasopharyngeal Swab     Status: None   Collection Time: 09/14/19  1:55 PM   Specimen: Nasopharyngeal Swab  Result Value Ref Range Status   SARS Coronavirus 2 NEGATIVE NEGATIVE Final    Comment: (NOTE) SARS-CoV-2 target nucleic acids are NOT DETECTED.  The SARS-CoV-2 RNA is generally detectable in upper and lower respiratory specimens during the acute phase of infection. Negative results do not preclude SARS-CoV-2 infection, do not rule out co-infections with other pathogens, and should not be used as the sole  basis for treatment or other patient management decisions. Negative results must be combined with clinical observations, patient history, and epidemiological information. The expected result is Negative.  Fact Sheet for Patients: SugarRoll.be  Fact Sheet for Healthcare Providers: https://www.woods-mathews.com/  This test is not yet approved or cleared by the [place] FDA and  has been authorized for detection and/or diagnosis of SARS-CoV-2 by FDA under an Emergency Use Authorization (EUA). This EUA will remain  in effect (meaning this test can be used) for the duration of the COVID-19 declaration under Se ction 564(b)(1) of the Act, 21 U.S.C. section 360bbb-3(b)(1), unless the authorization is terminated or revoked sooner.  Performed at [facility], [place] [address] [place], [place] [address]     Coagulation Studies: No results for input(s): LABPROT, INR in the last 72 hours.  Urinalysis:  Recent Labs  Lab 10/11/19 1221  BILIRUBINUR negative  PROTEINUR Negative  UROBILINOGEN 0.2  NITRITE negative  LEUKOCYTESUR Negative    Lipid Panel:     Component Value Date/Time   CHOL 106 07/20/2018 0959   TRIG 51.0 07/20/2018 0959   HDL 48.00 07/20/2018 0959   CHOLHDL 2 07/20/2018 0959   VLDL 10.2 07/20/2018 0959   LDLCALC 48 07/20/2018 0959    HgbA1C: No results found for: HGBA1C  Urine Drug Screen:  No results found for: LABOPIA, COCAINSCRNUR, LABBENZ, AMPHETMU, THCU, LABBARB  Alcohol Level: No results for input(s): ETH in the last 168 hours.  Other results: EKG: sinus rhythm at 65 bpm.  Imaging: DG Chest 2 View  Result Date: 10/11/2019 CLINICAL DATA:  Dizziness.  Fall out of wheelchair onto cement. EXAM: CHEST - 2 VIEW COMPARISON:  09/16/2019 FINDINGS: Dual lead left-sided pacemaker in place.The cardiomediastinal contours are normal. The lungs are clear. Pulmonary vasculature is normal. No consolidation, pleural  effusion, or pneumothorax. Remote right clavicle fracture. No acute osseous abnormalities are seen. IMPRESSION: No acute chest findings. Electronically Signed   By: Keith Rake M.D.   On: 10/11/2019 22:24   CT Head Wo Contrast  Result Date: 10/12/2019 CLINICAL DATA:  Patient fell out of wheelchair with abrasions to the face. Dizziness. EXAM: CT HEAD WITHOUT CONTRAST CT MAXILLOFACIAL WITHOUT CONTRAST CT CERVICAL SPINE WITHOUT CONTRAST TECHNIQUE: Multidetector CT imaging of the head, cervical spine, and maxillofacial structures were performed using the standard protocol without intravenous contrast. Multiplanar CT image reconstructions of the cervical spine and maxillofacial structures were also generated. 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 Intracranial contents are otherwise unremarkable. No mass  effect or midline shift. No abnormal extra-axial fluid collections. No ventricular dilatation. Gray-white matter junctions are distinct. No acute intracranial hemorrhage. Vascular: No hyperdense vessel or unexpected calcification. Skull: Calvarium appears intact. Other: None. CT MAXILLOFACIAL FINDINGS Osseous: Minimally displaced acute fractures of the left anterior and lateral maxillary antral wall. Orbital and facial bones appear otherwise intact. Orbits: Postoperative changes consistent with ocular banding. Oblique shape to both globes. Globes and extraocular muscles are otherwise symmetrical. No periorbital or retro bulbar soft tissue hematoma or swelling. Sinuses: Air-fluid level in the left maxillary antrum. Mucosal thickening elsewhere in the paranasal sinuses. Mastoid air cells are clear. Soft tissues: No significant soft tissue swelling or hematoma identified. CT CERVICAL SPINE FINDINGS Alignment: Normal alignment of the cervical spine. C1-2 articulation appears intact. Skull base and vertebrae: Skull base appears intact. No  vertebral compression deformities. No focal bone lesion or bone destruction. Bone cortex appears intact. Soft tissues and spinal canal: No prevertebral soft tissue swelling. No abnormal paraspinal soft tissue mass or infiltration. Cervical lymph nodes are not pathologically enlarged. Disc levels: Degenerative changes throughout with narrowed interspaces and endplate hypertrophic changes. Degenerative changes are most prominent at C5-6 and C6-7 levels. Degenerative changes in the facet joints. Upper chest: Visualized lung apices are clear. Other: None. IMPRESSION: 1. No acute intracranial abnormalities. 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 2. Minimally displaced acute fractures of the left anterior and lateral maxillary antral wall. Air-fluid level in the left maxillary antrum. Orbital and facial bones appear otherwise intact. 3. Normal alignment of the cervical spine. Degenerative changes. No acute displaced fractures identified. Electronically Signed   By: Lucienne Capers M.D.   On: 10/12/2019 06:16   CT Cervical Spine Wo Contrast  Result Date: 10/12/2019 CLINICAL DATA:  Patient fell out of wheelchair with abrasions to the face. Dizziness. EXAM: CT HEAD WITHOUT CONTRAST CT MAXILLOFACIAL WITHOUT CONTRAST CT CERVICAL SPINE WITHOUT CONTRAST TECHNIQUE: Multidetector CT imaging of the head, cervical spine, and maxillofacial structures were performed using the standard protocol without intravenous contrast. Multiplanar CT image reconstructions of the cervical spine and maxillofacial structures were also generated. 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 Intracranial contents are otherwise unremarkable. No mass effect or midline shift. No abnormal extra-axial fluid collections. No ventricular dilatation. Gray-white matter junctions are distinct. No acute intracranial hemorrhage. Vascular: No hyperdense vessel or unexpected  calcification. Skull: Calvarium appears intact. Other: None. CT MAXILLOFACIAL FINDINGS Osseous: Minimally displaced acute fractures of the left anterior and lateral maxillary antral wall. Orbital and facial bones appear otherwise intact. Orbits: Postoperative changes consistent with ocular banding. Oblique shape to both globes. Globes and extraocular muscles are otherwise symmetrical. No periorbital or retro bulbar soft tissue hematoma or swelling. Sinuses: Air-fluid level in the left maxillary antrum. Mucosal thickening elsewhere in the paranasal sinuses. Mastoid air cells are clear. Soft tissues: No significant soft tissue swelling or hematoma identified. CT CERVICAL SPINE FINDINGS Alignment: Normal alignment of the cervical spine. C1-2 articulation appears intact. Skull base and vertebrae: Skull base appears intact. No vertebral compression deformities. No focal bone lesion or bone destruction. Bone cortex appears intact. Soft tissues and spinal canal: No prevertebral soft tissue swelling. No abnormal paraspinal soft tissue mass or infiltration. Cervical lymph nodes are not pathologically enlarged. Disc levels: Degenerative changes throughout with narrowed interspaces and endplate hypertrophic changes. Degenerative changes are most prominent at C5-6 and C6-7 levels. Degenerative changes in the facet joints. Upper chest: Visualized lung  apices are clear. Other: None. IMPRESSION: 1. No acute intracranial abnormalities. 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 2. Minimally displaced acute fractures of the left anterior and lateral maxillary antral wall. Air-fluid level in the left maxillary antrum. Orbital and facial bones appear otherwise intact. 3. Normal alignment of the cervical spine. Degenerative changes. No acute displaced fractures identified. Electronically Signed   By: Lucienne Capers M.D.   On: 10/12/2019 06:16   CT Maxillofacial Wo Contrast  Result Date: 10/12/2019 CLINICAL DATA:  Patient fell  out of wheelchair with abrasions to the face. Dizziness. EXAM: CT HEAD WITHOUT CONTRAST CT MAXILLOFACIAL WITHOUT CONTRAST CT CERVICAL SPINE WITHOUT CONTRAST TECHNIQUE: Multidetector CT imaging of the head, cervical spine, and maxillofacial structures were performed using the standard protocol without intravenous contrast. Multiplanar CT image reconstructions of the cervical spine and maxillofacial structures were also generated. 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 Intracranial contents are otherwise unremarkable. No mass effect or midline shift. No abnormal extra-axial fluid collections. No ventricular dilatation. Gray-white matter junctions are distinct. No acute intracranial hemorrhage. Vascular: No hyperdense vessel or unexpected calcification. Skull: Calvarium appears intact. Other: None. CT MAXILLOFACIAL FINDINGS Osseous: Minimally displaced acute fractures of the left anterior and lateral maxillary antral wall. Orbital and facial bones appear otherwise intact. Orbits: Postoperative changes consistent with ocular banding. Oblique shape to both globes. Globes and extraocular muscles are otherwise symmetrical. No periorbital or retro bulbar soft tissue hematoma or swelling. Sinuses: Air-fluid level in the left maxillary antrum. Mucosal thickening elsewhere in the paranasal sinuses. Mastoid air cells are clear. Soft tissues: No significant soft tissue swelling or hematoma identified. CT CERVICAL SPINE FINDINGS Alignment: Normal alignment of the cervical spine. C1-2 articulation appears intact. Skull base and vertebrae: Skull base appears intact. No vertebral compression deformities. No focal bone lesion or bone destruction. Bone cortex appears intact. Soft tissues and spinal canal: No prevertebral soft tissue swelling. No abnormal paraspinal soft tissue mass or infiltration. Cervical lymph nodes are not pathologically enlarged. Disc  levels: Degenerative changes throughout with narrowed interspaces and endplate hypertrophic changes. Degenerative changes are most prominent at C5-6 and C6-7 levels. Degenerative changes in the facet joints. Upper chest: Visualized lung apices are clear. Other: None. IMPRESSION: 1. No acute intracranial abnormalities. 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 2. Minimally displaced acute fractures of the left anterior and lateral maxillary antral wall. Air-fluid level in the left maxillary antrum. Orbital and facial bones appear otherwise intact. 3. Normal alignment of the cervical spine. Degenerative changes. No acute displaced fractures identified. Electronically Signed   By: Lucienne Capers M.D.   On: 10/12/2019 06:16     Assessment/Plan: 74 year old male presenting with dizziness.  Complaints are intermittent and have now caused a significant fall.  Neurological examination is nonfocal.  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  Patient not aware of this previous event.  On ASA.  MRI unable to be performed.  Due to risk factors acute infarct is in the differential but can not rule out other possibilities as well including VBI, BPV and orthostasis.    Recommendations: 1. Orthostatic vitals 2. CTA of the head and neck to rule out vertebrobasilar insufficency 3. ASA 81mg  and Plavix 75 mg for 3 weeks with change to Plavix 75mg  daily alone after that time 4. Recent lipid panel with mildly elevated LDL.  Would start statin 5.  MRI of the brain on an outpatient basis once the patient cleared by cardiology for procedure.  6. May try Meclizine 25mg  prn for symptomatic relief of symptoms.  Patient reports this has been helpful in the past.   7. BP control  Alexis Goodell, MD Neurology [phone] 10/12/2019, 10:58 AM

## 2019-10-12 NOTE — ED Notes (Signed)
Patient stood at bedside to use urinal. Patient was initially unsteady when standing, but was able to maintain posture when standing.

## 2019-10-12 NOTE — ED Notes (Signed)
Report given to Karma Ganja RN

## 2019-10-12 NOTE — ED Provider Notes (Signed)
[facility] Emergency Department Provider Note  Time seen: 8:07 AM  I have reviewed the triage vital signs and the nursing notes.   HISTORY  Chief Complaint Fall and Dizziness   HPI Bobby Woods is a 74 y.o. male with a past medical history of arthritis, BPH, CKD, gastric reflux, hypertension, presents to the emergency department for dizziness and a fall.  According to the patient over the past 5 or 6 days he has been experiencing intermittent episodes of dizziness which she describes as feeling off balance.  Denies any chest pain or shortness of breath.  Denies any focal weakness or numbness.  Patient states his wife has Parkinson's disease, while he was waiting for her he was sitting in her wheelchair, states he turned around to spit and somehow fell out of the wheelchair onto his face.  Patient denies LOC.  Patient does state some facial tenderness, has abrasions to the face.  Denies any other injuries at this time.  Denies any extremity chest back or abdominal pain.  Patient states for the past 5 days he has been experiencing the similar episodes of dizziness, he saw his PCP who was concerned that he might of had a stroke and is ordered a CT scan for him later this week.  Patient did have a pacemaker placed approximately 3 weeks ago and takes a baby aspirin daily.   Past Medical History:  Diagnosis Date  . Arthritis   . Blood transfusion without reported diagnosis    age 58 year   . BPH (benign prostatic hypertrophy)   . Chronic kidney disease   . GERD (gastroesophageal reflux disease)   . Hypertension    on medicines     Patient Active Problem List   Diagnosis Date Noted  . 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 Palpitations 06/16/2019  . RUQ pain 01/03/2019  . Chronic renal disease, stage III 07/20/2018  . Elevated PSA 07/08/2017  . Lower back pain 11/10/2014  . Advance directive discussed with patient 04/25/2014  . Routine general medical examination at a health care  facility 01/22/2011  . Actinic keratoses 01/22/2011  . GERD (gastroesophageal reflux disease)   . BPH with obstruction/lower urinary tract symptoms 01/03/2009  . OSTEOARTHRITIS 12/14/2006  . Essential hypertension, benign 05/25/2006    Past Surgical History:  Procedure Laterality Date  . COLONOSCOPY  2006  . HAND SURGERY    . [place]  . PACEMAKER IMPLANT N/A 09/16/2019   Procedure: PACEMAKER IMPLANT;  Surgeon: Vickie Epley, MD;  Location: [facility];  Service: Cardiovascular;  Laterality: N/A;  . PROSTATE BIOPSY  2011   negative  . [place]   left, right 05/07  . ROTATOR CUFF REPAIR  10/09   right  . TONSILLECTOMY  1972    Prior to Admission medications   Medication Sig Start Date End Date Taking? Authorizing Provider  amLODipine (NORVASC) 5 MG tablet Take 1 tablet (5 mg total) by mouth daily. 09/21/19 12/20/19  [name], MD  aspirin 81 MG tablet Take 81 mg by mouth every other day.     [provider]  finasteride (PROSCAR) 5 MG tablet TAKE 1 TABLET BY MOUTH EVERY DAY Patient taking differently: Take 5 mg by mouth daily.  10/01/18   Venia Carbon, MD  meloxicam (MOBIC) 15 MG tablet Take 1 tablet (15 mg total) by mouth daily. Patient taking differently: Take 15 mg by mouth daily as needed for pain.  02/18/19   Edrick Kins, DPM  metoprolol succinate (TOPROL-XL) 25 MG 24 hr tablet Take 25 mg by mouth daily.    [provider]  omeprazole (PRILOSEC) 20 MG capsule TAKE 1 CAPSULE BY MOUTH EVERY DAY Patient taking differently: Take 20 mg by mouth daily.  07/13/19   Venia Carbon, MD  tamsulosin (FLOMAX) 0.4 MG CAPS capsule Take 1 capsule (0.4 mg total) by mouth daily after supper. 10/11/19   Venia Carbon, MD  tiZANidine (ZANAFLEX) 2 MG tablet TAKE 1 TO 2 TABLETS BY MOUTH AT BEDTIME AS NEEDED FOR MUSCLE SPASMS Patient taking differently: Take 2-4 mg by mouth at bedtime as needed for muscle  spasms.  12/27/18   Venia Carbon, MD    Allergies  Allergen Reactions  . Zithromax [Azithromycin Dihydrate]     GI upset    Family History  Problem Relation Age of Onset  . Cancer Father        prostate  . Cancer Paternal Uncle        prostate cancer  . Colon cancer Neg Hx   . Rectal cancer Neg Hx   . Stomach cancer Neg Hx     Social History Social History   Tobacco Use  . Smoking status: Never Smoker  . Smokeless tobacco: Never Used  Vaping Use  . Vaping Use: Never used  Substance Use Topics  . Alcohol use: No  . Drug use: No    Review of Systems Constitutional: Negative for fever.  Intermittent dizziness. Cardiovascular: Negative for chest pain. Respiratory: Negative for shortness of breath. Gastrointestinal: Negative for abdominal pain, vomiting Musculoskeletal: Negative for musculoskeletal complaints Neurological: Negative for headache.  Denies focal weakness or numbness. All other ROS negative  ____________________________________________   PHYSICAL EXAM:  VITAL SIGNS: ED Triage Vitals  Enc Vitals Group     BP 10/11/19 2154 (!) 168/90     Pulse Rate 10/11/19 2154 62     Resp 10/11/19 2154 20     Temp 10/11/19 2154 99.1 F (37.3 C)     Temp Source 10/11/19 2154 Oral     SpO2 10/11/19 2154 96 %     Weight 10/11/19 2155 173 lb (78.5 kg)     Height 10/11/19 2155 5\' 4"  (1.626 m)     Head Circumference --      Peak Flow --      Pain Score 10/11/19 2155 4     Pain Loc --      Pain Edu? --      Excl. in [place]? --     Constitutional: Alert and oriented.  Patient does have an abrasion to his nasal bridge as well as upper lip. Eyes: Slight nystagmus patient states is chronic secondary to retinal detachment previously. ENT      Head: Abrasions to nasal bridge and upper lip.      Nose: No congestion/rhinnorhea.  Dried blood in both nostrils but no septal hematoma      Mouth/Throat: Abrasion upper lip with dried blood no lacerations  identified. Cardiovascular: Normal rate, regular rhythm. No murmur Respiratory: Normal respiratory effort without tachypnea nor retractions. Breath sounds are clear  Gastrointestinal: Soft and nontender. No distention.  Musculoskeletal: Nontender with normal range of motion in all extremities. No lower extremity tenderness Neurologic:  Normal speech and language. No gross focal neurologic deficits  Skin:  Skin is warm.  Abrasions to nasal bridge and upper lip Psychiatric: Mood and affect are normal.  ____________________________________________  EKG  EKG viewed and interpreted by myself shows a normal sinus rhythm at 65 bpm with a narrow QRS, normal axis, normal intervals, nonspecific ST changes.  ____________________________________________    [id number]  CT scans show minimally displaced left maxillary fractures.  No acute abnormality intracranially but there does appear to be an older appearing stroke.  C-spine is negative  ____________________________________________   INITIAL IMPRESSION / ASSESSMENT AND PLAN / ED COURSE  Pertinent labs & imaging results that were available during my care of the patient were reviewed by me and considered in my medical decision making (see chart for details).   Patient presents to the emergency department after falling out of his wife's wheelchair.  Patient normally ambulates without any assistance but states over the past 5 or 6 days he has been feeling dizzy and off balance at times.  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  Unfortunately with his pacemaker we are not able to obtain an MRI.  CT does show left maxillary sinus fractures patient is nontender to this area on exam.  However given the fractures we will cover with a short course of antibiotics as a precaution.  However given the increased dizziness over the past 5 to 6 days and CT scan showing old infarct that was unknown to the patient previously we will  discuss with neurology for further recommendations.  Patient has been seen and evaluated by Dr. Doy Mince of neurology.  She recommends obtaining a CTA of the head and neck.  If CTA does not show any concerning findings patient can be discharged with outpatient neurology follow-up for an MRI when the patient is able to have this performed at the facility able to perform with pacemaker.  CTA of the head and neck are negative for large vessel occlusion and normal for age.  Will discharge with neurology follow-up for further evaluation as well as meclizine to be used as needed for dizziness.  Patient agreeable to plan of care.  Bobby Woods was evaluated in Emergency Department on 10/12/2019 for the symptoms described in the history of present illness. He was evaluated in the context of the global COVID-19 pandemic, which necessitated consideration that the patient might be at risk for infection with the SARS-CoV-2 virus that causes COVID-19. Institutional protocols and algorithms that pertain to the evaluation of patients at risk for COVID-19 are in a state of rapid change based on information released by regulatory bodies including the CDC and federal and state organizations. These policies and algorithms were followed during the patient's care in the ED.  ____________________________________________   FINAL CLINICAL IMPRESSION(S) / ED DIAGNOSES  Maxillary sinus fracture Fall Dizziness   Harvest Dark, MD 10/12/19 1323

## 2019-10-13 ENCOUNTER — Telehealth: Payer: Self-pay | Admitting: Internal Medicine

## 2019-10-13 NOTE — Telephone Encounter (Signed)
Patient called in to notify PCP that he fell and went to ER.They performed a CT scan for him and did discover he had a previous stroke. They are going to give him a MRI as well to determine when it took place. Patient is also has questions in regards to plavix and wants to know if this needs to change for an interaction. Please advise.

## 2019-10-13 NOTE — Telephone Encounter (Signed)
Please let him know that I saw about his ER visit and the neurology evaluation, etc-----and was going to try to catch up with him about it today. Obviously we can cancel the CT I had ordered I want him to stay on the plavix----but the interaction is with omeprazol. He should stop the omeprazole and substitute pantoprazole 40 (please send new Rx)

## 2019-10-14 ENCOUNTER — Other Ambulatory Visit: Payer: Self-pay | Admitting: Internal Medicine

## 2019-10-14 MED ORDER — PANTOPRAZOLE SODIUM 40 MG PO TBEC: 40.0000 mg | DELAYED_RELEASE_TABLET | Freq: Every day | ORAL | 11 refills | Status: DC

## 2019-10-14 NOTE — Telephone Encounter (Signed)
Spoke to pt's wife. Sent in new rx for pantoprazole. Advised her the CT was not needed now.

## 2019-10-20 ENCOUNTER — Encounter: Payer: Self-pay | Admitting: Internal Medicine

## 2019-10-20 ENCOUNTER — Other Ambulatory Visit: Payer: Self-pay

## 2019-10-20 ENCOUNTER — Ambulatory Visit: Payer: Medicare PPO | Admitting: Internal Medicine

## 2019-10-20 DIAGNOSIS — R270 Ataxia, unspecified: Secondary | ICD-10-CM

## 2019-10-20 MED ORDER — MECLIZINE HCL 25 MG PO TABS: 25.0000 mg | ORAL_TABLET | Freq: Three times a day (TID) | ORAL | 3 refills | Status: DC | PRN

## 2019-10-20 NOTE — Progress Notes (Signed)
Subjective:    Patient ID: Bobby Woods, male    DOB: 06/18/1945, 74 y.o.   MRN: [id number]  HPI Here for follow up of ataxia With wife This visit occurred during the SARS-CoV-2 public health emergency.  Safety protocols were in place, including screening questions prior to the visit, additional usage of staff PPE, and extensive cleaning of exam room while observing appropriate contact time as indicated for disinfecting solutions.   Went to ER after my visit Had CT head--only old stroke CT angiogram negative CT cervical spine negative  Does have appt with Dr Potter---neurology  Dizziness is better Using the meclizine tid--may be the reason for the improvement Still gets "wiped out" by 5-6 PM  Current Outpatient Medications on File Prior to Visit  Medication Sig Dispense Refill  . amLODipine (NORVASC) 5 MG tablet TAKE 1 TABLET BY MOUTH EVERY DAY 90 tablet 3  . aspirin 81 MG tablet Take 81 mg by mouth every other day.     . clopidogrel (PLAVIX) 75 MG tablet Take 1 tablet (75 mg total) by mouth daily. 30 tablet 2  . finasteride (PROSCAR) 5 MG tablet TAKE 1 TABLET BY MOUTH EVERY DAY (Patient taking differently: Take 5 mg by mouth daily. ) 90 tablet 3  . meclizine (ANTIVERT) 25 MG tablet Take 1 tablet (25 mg total) by mouth 3 (three) times daily as needed for dizziness. 30 tablet 0  . meloxicam (MOBIC) 15 MG tablet Take 1 tablet (15 mg total) by mouth daily. (Patient taking differently: Take 15 mg by mouth daily as needed for pain. ) 30 tablet 1  . metoprolol succinate (TOPROL-XL) 25 MG 24 hr tablet Take 25 mg by mouth daily.    . pantoprazole (PROTONIX) 40 MG tablet Take 1 tablet (40 mg total) by mouth daily. 30 tablet 11  . tamsulosin (FLOMAX) 0.4 MG CAPS capsule Take 1 capsule (0.4 mg total) by mouth daily after supper. 90 capsule 3  . tiZANidine (ZANAFLEX) 2 MG tablet TAKE 1 TO 2 TABLETS BY MOUTH AT BEDTIME AS NEEDED FOR MUSCLE SPASMS (Patient taking differently: Take 2-4 mg by  mouth at bedtime as needed for muscle spasms. ) 180 tablet 1   No current facility-administered medications on file prior to visit.    Allergies  Allergen Reactions  . Zithromax [Azithromycin Dihydrate]     GI upset    Past Medical History:  Diagnosis Date  . Arthritis   . Blood transfusion without reported diagnosis    age 10 year   . BPH (benign prostatic hypertrophy)   . Chronic kidney disease   . GERD (gastroesophageal reflux disease)   . Hypertension    on medicines     Past Surgical History:  Procedure Laterality Date  . COLONOSCOPY  2006  . HAND SURGERY    . [place]  . PACEMAKER IMPLANT N/A 09/16/2019   Procedure: PACEMAKER IMPLANT;  Surgeon: Vickie Epley, MD;  Location: [facility];  Service: Cardiovascular;  Laterality: N/A;  . PROSTATE BIOPSY  2011   negative  . [place]   left, right 05/07  . ROTATOR CUFF REPAIR  10/09   right  . TONSILLECTOMY  1972    Family History  Problem Relation Age of Onset  . Cancer Father        prostate  . Cancer Paternal Uncle        prostate cancer  . Colon cancer Neg Hx   .  Rectal cancer Neg Hx   . Stomach cancer Neg Hx     Social History   Socioeconomic History  . Marital status: Married    Spouse name: Not on file  . Number of children: 3  . Years of education: Not on file  . Highest education level: Not on file  Occupational History  . Occupation: Psychologist, prison and probation services   Retired 8/13    Employer: [employer]  . Occupation: Quality assurance--cleaning    Employer: [employer]    Comment:    . Occupation: Custodial/funeral assistance    Comment: [employer]  Tobacco Use  . Smoking status: Never Smoker  . Smokeless tobacco: Never Used  Vaping Use  . Vaping Use: Never used  Substance and Sexual Activity  . Alcohol use: No  . Drug use: No  . Sexual activity: Not on file  Other Topics Concern  . Not on file  Social History Narrative   Has  living will   Wife has [place] health care POA and alternate is [name] (cousin)   Would accept attempts at resuscitation but no prolonged artificial ventilation   Not sure about a feeding tube   Social Determinants of Health   Financial Resource Strain:   . Difficulty of Paying Living Expenses: Not on file  Food Insecurity:   . Worried About Charity fundraiser in the Last Year: Not on file  . Ran Out of Food in the Last Year: Not on file  Transportation Needs:   . Lack of Transportation (Medical): Not on file  . Lack of Transportation (Non-Medical): Not on file  Physical Activity:   . Days of Exercise per Week: Not on file  . Minutes of Exercise per Session: Not on file  Stress:   . Feeling of Stress : Not on file  Social Connections:   . Frequency of Communication with Friends and Family: Not on file  . Frequency of Social Gatherings with Friends and Family: Not on file  . Attends Religious Services: Not on file  . Active Member of Clubs or Organizations: Not on file  . Attends Archivist Meetings: Not on file  . Marital Status: Not on file  Intimate Partner Violence:   . Fear of Current or Ex-Partner: Not on file  . Emotionally Abused: Not on file  . Physically Abused: Not on file  . Sexually Abused: Not on file   Review of Systems Nocturia x 6-7---really affects his sleep Just started the finasteride    Objective:   Physical Exam Constitutional:      Appearance: Normal appearance.  Cardiovascular:     Rate and Rhythm: Normal rate and regular rhythm.     Heart sounds: No murmur heard.  No gallop.   Pulmonary:     Effort: Pulmonary effort is normal.     Breath sounds: Normal breath sounds. No wheezing or rales.  Musculoskeletal:     Cervical back: Neck supple.     Right lower leg: No edema.     Left lower leg: No edema.  Lymphadenopathy:     Cervical: No cervical adenopathy.  Neurological:     Mental Status: He is alert.     Comments: No ataxia  with rolling walker            Assessment & Plan:

## 2019-10-20 NOTE — Assessment & Plan Note (Addendum)
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

## 2019-10-31 ENCOUNTER — Telehealth: Payer: Self-pay | Admitting: Internal Medicine

## 2019-10-31 NOTE — Telephone Encounter (Signed)
Patient was told to take Meclizine for dizziness.  Patient took 3 pills for 2 weeks and last week he started taking 2 pills.  Patient said he didn't have any dizziness until last Friday. Patient said it came on him all at once and it lasted awhile and he had 2 more episodes before bed.  Patient was suppose to start taking 1 pill this week.  Patient wants to know if he should take 2 pills since he had the dizziness last Friday?  Patient said he has plenty of pills.

## 2019-10-31 NOTE — Telephone Encounter (Signed)
He can continue 2 pills a day for the next few days. If the dizziness goes away again, he can go down to 1 daily for 2-3 days then stop. He can always restart the 3 a day if the dizziness recurs---and then quickly wean off if the symptoms are brief (like over a few days)

## 2019-10-31 NOTE — Telephone Encounter (Signed)
Spoke to pt. Stated he understood the directions. 

## 2019-11-14 DIAGNOSIS — R5383 Other fatigue: Secondary | ICD-10-CM | POA: Insufficient documentation

## 2019-11-14 DIAGNOSIS — R42 Dizziness and giddiness: Secondary | ICD-10-CM | POA: Diagnosis not present

## 2019-11-14 DIAGNOSIS — R296 Repeated falls: Secondary | ICD-10-CM | POA: Diagnosis not present

## 2019-11-14 DIAGNOSIS — R2689 Other abnormalities of gait and mobility: Secondary | ICD-10-CM | POA: Insufficient documentation

## 2019-11-28 ENCOUNTER — Telehealth: Payer: Self-pay | Admitting: Cardiology

## 2019-11-28 NOTE — Telephone Encounter (Signed)
Dr Lannie Fields office calling Would like to know if we can change metoprolol to lisinopril due to bradycardia If Dr Garen Lah is ok with this switch, please contact the patient  If any additional questions, please call Dr Weber Cooks office at [phone]

## 2019-11-29 NOTE — Telephone Encounter (Signed)
Spoke with patient and relayed Dr. Thereasa Solo recommendation as copied in previous note. Patient has a remote pacer check on 12/16/19 and will be in office for a physical pacer check on 12/21/19. Patient verbalized understanding and agreed with plan.

## 2019-11-29 NOTE — Telephone Encounter (Signed)
Called and left a VM for patient to call back.  Also called Dr. Lannie Fields office and relayed Dr. Thereasa Solo recommendation as copied and pasted below:  Please continue metoprolol as prescribed. Keep appointments with device clinic for pacemaker check. Patient should not be bradycardic as he has a pacemaker unless pacemaker is not functioning well.

## 2019-11-30 DIAGNOSIS — M79641 Pain in right hand: Secondary | ICD-10-CM | POA: Diagnosis not present

## 2019-11-30 DIAGNOSIS — M48061 Spinal stenosis, lumbar region without neurogenic claudication: Secondary | ICD-10-CM | POA: Diagnosis not present

## 2019-11-30 DIAGNOSIS — G8929 Other chronic pain: Secondary | ICD-10-CM | POA: Diagnosis not present

## 2019-11-30 DIAGNOSIS — M5442 Lumbago with sciatica, left side: Secondary | ICD-10-CM | POA: Diagnosis not present

## 2019-11-30 DIAGNOSIS — M5441 Lumbago with sciatica, right side: Secondary | ICD-10-CM | POA: Diagnosis not present

## 2019-12-03 ENCOUNTER — Other Ambulatory Visit: Payer: Self-pay | Admitting: Internal Medicine

## 2019-12-13 DIAGNOSIS — R42 Dizziness and giddiness: Secondary | ICD-10-CM | POA: Diagnosis not present

## 2019-12-13 DIAGNOSIS — R2689 Other abnormalities of gait and mobility: Secondary | ICD-10-CM | POA: Diagnosis not present

## 2019-12-13 DIAGNOSIS — R5383 Other fatigue: Secondary | ICD-10-CM | POA: Diagnosis not present

## 2019-12-14 DIAGNOSIS — M48061 Spinal stenosis, lumbar region without neurogenic claudication: Secondary | ICD-10-CM | POA: Diagnosis not present

## 2019-12-14 DIAGNOSIS — M5442 Lumbago with sciatica, left side: Secondary | ICD-10-CM | POA: Diagnosis not present

## 2019-12-16 ENCOUNTER — Ambulatory Visit (INDEPENDENT_AMBULATORY_CARE_PROVIDER_SITE_OTHER): Payer: Medicare PPO

## 2019-12-16 DIAGNOSIS — I495 Sick sinus syndrome: Secondary | ICD-10-CM | POA: Diagnosis not present

## 2019-12-16 LAB — CUP PACEART REMOTE DEVICE CHECK
Battery Remaining Longevity: 143 mo
Battery Voltage: 3.19 V
Brady Statistic AP VP Percent: 0.04 %
Brady Statistic AP VS Percent: 68.99 %
Brady Statistic AS VP Percent: 0.01 %
Brady Statistic AS VS Percent: 30.96 %
Brady Statistic RA Percent Paced: 69.09 %
Brady Statistic RV Percent Paced: 0.06 %
Date Time Interrogation Session: 20211202222445
Implantable Lead Implant Date: 20210903
Implantable Lead Implant Date: 20210903
Implantable Lead Location: 753859
Implantable Lead Location: 753860
Implantable Lead Model: 5076
Implantable Lead Model: 5076
Implantable Pulse Generator Implant Date: 20210903
Lead Channel Impedance Value: 304 Ohm
Lead Channel Impedance Value: 380 Ohm
Lead Channel Impedance Value: 399 Ohm
Lead Channel Impedance Value: 456 Ohm
Lead Channel Pacing Threshold Amplitude: 0.625 V
Lead Channel Pacing Threshold Amplitude: 0.625 V
Lead Channel Pacing Threshold Pulse Width: 0.4 ms
Lead Channel Pacing Threshold Pulse Width: 0.4 ms
Lead Channel Sensing Intrinsic Amplitude: 1.75 mV
Lead Channel Sensing Intrinsic Amplitude: 1.75 mV
Lead Channel Sensing Intrinsic Amplitude: 8.625 mV
Lead Channel Sensing Intrinsic Amplitude: 8.625 mV
Lead Channel Setting Pacing Amplitude: 3.5 V
Lead Channel Setting Pacing Amplitude: 3.5 V
Lead Channel Setting Pacing Pulse Width: 0.4 ms
Lead Channel Setting Sensing Sensitivity: 1.2 mV

## 2019-12-21 ENCOUNTER — Other Ambulatory Visit: Payer: Self-pay

## 2019-12-21 ENCOUNTER — Encounter: Payer: Self-pay | Admitting: Cardiology

## 2019-12-21 ENCOUNTER — Ambulatory Visit: Payer: Medicare PPO | Admitting: Cardiology

## 2019-12-21 VITALS — BP 132/90 | HR 80 | Ht 64.0 in | Wt 187.0 lb

## 2019-12-21 DIAGNOSIS — I495 Sick sinus syndrome: Secondary | ICD-10-CM | POA: Diagnosis not present

## 2019-12-21 DIAGNOSIS — M65331 Trigger finger, right middle finger: Secondary | ICD-10-CM | POA: Diagnosis not present

## 2019-12-21 DIAGNOSIS — R270 Ataxia, unspecified: Secondary | ICD-10-CM

## 2019-12-21 DIAGNOSIS — Z950 Presence of cardiac pacemaker: Secondary | ICD-10-CM | POA: Diagnosis not present

## 2019-12-21 NOTE — Patient Instructions (Addendum)
Medication Instructions:  Your physician recommends that you continue on your current medications as directed. Please refer to the Current Medication list given to you today. *If you need a refill on your cardiac medications before your next appointment, please call your pharmacy*  Lab Work: None ordered. If you have labs (blood work) drawn today and your tests are completely normal, you will receive your results only by: Marland Kitchen MyChart Message (if you have MyChart) OR . A paper copy in the mail If you have any lab test that is abnormal or we need to change your treatment, we will call you to review the results.  Testing/Procedures: None ordered.  Follow-Up: At [facility], you and your health needs are our priority.  As part of our continuing mission to provide you with exceptional heart care, we have created designated Provider Care Teams.  These Care Teams include your primary Cardiologist (physician) and Advanced Practice Providers (APPs -  Physician Assistants and Nurse Practitioners) who all work together to provide you with the care you need, when you need it.  We recommend signing up for the patient portal called "MyChart".  Sign up information is provided on this After Visit Summary.  MyChart is used to connect with patients for Virtual Visits (Telemedicine).  Patients are able to view lab/test results, encounter notes, upcoming appointments, etc.  Non-urgent messages can be sent to your provider as well.   To learn more about what you can do with MyChart, go to [email or web address].    Your next appointment:   Your physician wants you to follow-up in: 9 months with Dr. Quentin Ore.   You will receive a reminder letter in the mail two months in advance. If you don't receive a letter, please call our office to schedule the follow-up appointment.  Remote monitoring is used to monitor your Pacemaker from home. This monitoring reduces the number of office visits required to check your device  to one time per year. It allows [place] to keep an eye on the functioning of your device to ensure it is working properly. You are scheduled for a device check from home on 03/16/2020. You may send your transmission at any time that day. If you have a wireless device, the transmission will be sent automatically. After your physician reviews your transmission, you will receive a postcard with your next transmission date.

## 2019-12-21 NOTE — Progress Notes (Signed)
Electrophysiology Office Follow up Visit Note:    Date:  12/21/2019   ID:  [name], DOB 08-19-1945, MRN [id number]  PCP:  Venia Carbon, MD  [facility] Cardiologist:  [name], MD  [facility] Electrophysiologist:  Vickie Epley, MD    Interval History:    Bobby Woods is a 74 y.o. male who presents for a follow up visit after pacemaker implantation on September 16, 2019.  Pacemaker interrogation after the implant have shown stable lead parameters.  Device is well-healed.  Patient is currently undergoing a work-up with his neurologist regarding a possible prior stroke.  He thinks this stroke impacted his vision and thus he is also seeing an ophthalmologist.   Past Medical History:  Diagnosis Date  . Arthritis   . Blood transfusion without reported diagnosis    age 50 year   . BPH (benign prostatic hypertrophy)   . Chronic kidney disease   . GERD (gastroesophageal reflux disease)   . Hypertension    on medicines     Past Surgical History:  Procedure Laterality Date  . COLONOSCOPY  2006  . HAND SURGERY    . [place]  . PACEMAKER IMPLANT N/A 09/16/2019   Procedure: PACEMAKER IMPLANT;  Surgeon: Vickie Epley, MD;  Location: [place] [facility];  Service: Cardiovascular;  Laterality: N/A;  . PROSTATE BIOPSY  2011   negative  . [place]   left, right 05/07  . ROTATOR CUFF REPAIR  10/09   right  . TONSILLECTOMY  1972    Current Medications: Current Meds  Medication Sig  . amLODipine (NORVASC) 5 MG tablet TAKE 1 TABLET BY MOUTH EVERY DAY  . clopidogrel (PLAVIX) 75 MG tablet Take 1 tablet (75 mg total) by mouth daily.  . finasteride (PROSCAR) 5 MG tablet TAKE 1 TABLET BY MOUTH EVERY DAY  . meclizine (ANTIVERT) 25 MG tablet Take 1 tablet (25 mg total) by mouth 3 (three) times daily as needed for dizziness.  . metoprolol succinate (TOPROL-XL) 25 MG 24 hr tablet Take 25 mg by mouth daily.  .  pantoprazole (PROTONIX) 40 MG tablet Take 1 tablet (40 mg total) by mouth daily.  . tamsulosin (FLOMAX) 0.4 MG CAPS capsule Take 1 capsule (0.4 mg total) by mouth daily after supper.     Allergies:   Zithromax [azithromycin dihydrate]   Social History   Socioeconomic History  . Marital status: Married    Spouse name: Not on file  . Number of children: 3  . Years of education: Not on file  . Highest education level: Not on file  Occupational History  . 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 Occupation: Quality assurance--cleaning    Employer: [employer]    Comment:    . Occupation: Custodial/funeral assistance    Comment: [place]  Tobacco Use  . Smoking status: Never Smoker  . Smokeless tobacco: Never Used  Vaping Use  . Vaping Use: Never used  Substance and Sexual Activity  . Alcohol use: No  . Drug use: No  . Sexual activity: Not on file  Other Topics Concern  . Not on file  Social History Narrative   Has living will   Wife has [place] health care POA and alternate is [name] (cousin)   Would accept attempts at resuscitation but no prolonged artificial ventilation   Not sure about a feeding tube  Social Determinants of Health   Financial Resource Strain:   . Difficulty of Paying Living Expenses: Not on file  Food Insecurity:   . Worried About Charity fundraiser in the Last Year: Not on file  . Ran Out of Food in the Last Year: Not on file  Transportation Needs:   . Lack of Transportation (Medical): Not on file  . Lack of Transportation (Non-Medical): Not on file  Physical Activity:   . Days of Exercise per Week: Not on file  . Minutes of Exercise per Session: Not on file  Stress:   . Feeling of Stress : Not on file  Social Connections:   . Frequency of Communication with Friends and Family: Not on file  . Frequency of Social Gatherings with Friends and Family: Not on file  . Attends Religious Services: Not  on file  . Active Member of Clubs or Organizations: Not on file  . Attends Archivist Meetings: Not on file  . Marital Status: Not on file     Family History: The patient's family history includes Cancer in his father and paternal uncle. There is no history of Colon cancer, Rectal cancer, or Stomach cancer.  ROS:   Please see the history of present illness.    All other systems reviewed and are negative.  EKGs/Labs/Other Studies Reviewed:    The following studies were reviewed today: In person device interrogation, prior records  December 21, 2019 in person device interrogation personally reviewed Battery longevity 11.9 years, AAIR to DDDR 60-1 30 Atrial lead impedance 399 ohms, capture threshold 0.375 0.4, P waves 3.8 mV Ventricular lead impedance 456 ohms, capture threshold 0.625 at 0.4, R waves 6.8 Atrially pacing 69.1%, ventricular pacing less than 0.1% Patient activity averages about 4 hours/day. Device had for VT monitored events which on review of the electrograms showed brief episodes of supraventricular tachycardia.  No episodes of ventricular tachycardia  EKG:  The ekg ordered today demonstrates atrially pacing, ventricular sensing  Recent Labs: 06/16/2019: ALT 19 10/11/2019: BUN 17; Creatinine, Ser 1.08; Hemoglobin 16.0; Platelets 199; Potassium 4.2; Sodium 140  Recent Lipid Panel    Component Value Date/Time   CHOL 106 07/20/2018 0959   TRIG 51.0 07/20/2018 0959   HDL 48.00 07/20/2018 0959   CHOLHDL 2 07/20/2018 0959   VLDL 10.2 07/20/2018 0959   LDLCALC 48 07/20/2018 0959    Physical Exam:    VS:  BP 132/90   Pulse 80   Ht 5\' 4"  (1.626 m)   Wt 187 lb (84.8 kg)   SpO2 98%   BMI 32.10 kg/m     Wt Readings from Last 3 Encounters:  12/21/19 187 lb (84.8 kg)  10/20/19 179 lb (81.2 kg)  10/11/19 173 lb (78.5 kg)     GEN:  Well nourished, well developed in no acute distress HEENT: Normal NECK: No JVD; No carotid bruits LYMPHATICS: No  lymphadenopathy CARDIAC: RRR, no murmurs, rubs, gallops.  Pacemaker pocket well-healed. RESPIRATORY:  Clear to auscultation without rales, wheezing or rhonchi  ABDOMEN: Soft, non-tender, non-distended MUSCULOSKELETAL:  No edema; No deformity  SKIN: Warm and dry NEUROLOGIC:  Alert and oriented x 3 PSYCHIATRIC:  Normal affect   ASSESSMENT:    1. Pacemaker   2. Tachycardia-bradycardia syndrome ([place])   3. Ataxia    PLAN:    In order of problems listed above:  1. Symptomatic bradycardia/tachybradycardia syndrome Doing well post permanent pacemaker implant.  Atrially pacing about 70% of the time.  Device interrogation  in clinic today shows good battery longevity and stable lead parameters.  We will plan on seeing him back roughly 1 year after device implant.  2.  History of stroke Tells me that his neuro status is improving.  Fatigue is improving.  Work-up ongoing with his neurologist.  Follow-up 9 months  Medication Adjustments/Labs and Tests Ordered: Current medicines are reviewed at length with the patient today.  Concerns regarding medicines are outlined above.  Orders Placed This Encounter  Procedures  . EKG 12-Lead   No orders of the defined types were placed in this encounter.    Signed, Lars Mage, MD, [facility]  12/21/2019 11:39 AM    Electrophysiology [place]

## 2019-12-22 ENCOUNTER — Encounter: Payer: Self-pay | Admitting: Cardiology

## 2019-12-22 DIAGNOSIS — E669 Obesity, unspecified: Secondary | ICD-10-CM | POA: Diagnosis not present

## 2019-12-22 DIAGNOSIS — I10 Essential (primary) hypertension: Secondary | ICD-10-CM | POA: Diagnosis not present

## 2019-12-22 DIAGNOSIS — R32 Unspecified urinary incontinence: Secondary | ICD-10-CM | POA: Diagnosis not present

## 2019-12-22 DIAGNOSIS — Z6834 Body mass index (BMI) 34.0-34.9, adult: Secondary | ICD-10-CM | POA: Diagnosis not present

## 2019-12-22 DIAGNOSIS — R42 Dizziness and giddiness: Secondary | ICD-10-CM | POA: Diagnosis not present

## 2019-12-22 DIAGNOSIS — G8929 Other chronic pain: Secondary | ICD-10-CM | POA: Diagnosis not present

## 2019-12-22 DIAGNOSIS — K219 Gastro-esophageal reflux disease without esophagitis: Secondary | ICD-10-CM | POA: Diagnosis not present

## 2019-12-22 DIAGNOSIS — M2550 Pain in unspecified joint: Secondary | ICD-10-CM | POA: Diagnosis not present

## 2019-12-22 DIAGNOSIS — N400 Enlarged prostate without lower urinary tract symptoms: Secondary | ICD-10-CM | POA: Diagnosis not present

## 2019-12-22 LAB — PACEMAKER DEVICE OBSERVATION

## 2019-12-27 DIAGNOSIS — M5442 Lumbago with sciatica, left side: Secondary | ICD-10-CM | POA: Diagnosis not present

## 2019-12-27 DIAGNOSIS — M48061 Spinal stenosis, lumbar region without neurogenic claudication: Secondary | ICD-10-CM | POA: Diagnosis not present

## 2019-12-27 DIAGNOSIS — G8929 Other chronic pain: Secondary | ICD-10-CM | POA: Diagnosis not present

## 2019-12-27 DIAGNOSIS — M5441 Lumbago with sciatica, right side: Secondary | ICD-10-CM | POA: Diagnosis not present

## 2019-12-28 NOTE — Progress Notes (Signed)
Remote pacemaker transmission.   

## 2020-01-09 DIAGNOSIS — H4423 Degenerative myopia, bilateral: Secondary | ICD-10-CM | POA: Diagnosis not present

## 2020-01-10 ENCOUNTER — Telehealth: Payer: Self-pay | Admitting: Internal Medicine

## 2020-01-10 NOTE — Telephone Encounter (Signed)
Pt called in wanted to know about getting a prescription for plavix he got it from the ER doctor but needs a refill and they told him to call his PCP

## 2020-01-10 NOTE — Telephone Encounter (Signed)
Dr Alphonsus Sias, do you want to take over it or should cardiology?

## 2020-01-11 DIAGNOSIS — M48061 Spinal stenosis, lumbar region without neurogenic claudication: Secondary | ICD-10-CM | POA: Diagnosis not present

## 2020-01-11 DIAGNOSIS — M5442 Lumbago with sciatica, left side: Secondary | ICD-10-CM | POA: Diagnosis not present

## 2020-01-11 DIAGNOSIS — M5441 Lumbago with sciatica, right side: Secondary | ICD-10-CM | POA: Diagnosis not present

## 2020-01-11 MED ORDER — CLOPIDOGREL BISULFATE 75 MG PO TABS: 75.0000 mg | ORAL_TABLET | Freq: Every day | ORAL | 11 refills | Status: AC

## 2020-01-11 NOTE — Telephone Encounter (Signed)
Rx sent electronically.  

## 2020-01-11 NOTE — Telephone Encounter (Signed)
Okay to refill for 1 year We will likely be continuing it---does have cardiology appt in March

## 2020-01-24 DIAGNOSIS — M5442 Lumbago with sciatica, left side: Secondary | ICD-10-CM | POA: Diagnosis not present

## 2020-01-24 DIAGNOSIS — M48061 Spinal stenosis, lumbar region without neurogenic claudication: Secondary | ICD-10-CM | POA: Diagnosis not present

## 2020-01-24 DIAGNOSIS — G8929 Other chronic pain: Secondary | ICD-10-CM | POA: Diagnosis not present

## 2020-01-24 DIAGNOSIS — M5441 Lumbago with sciatica, right side: Secondary | ICD-10-CM | POA: Diagnosis not present

## 2020-02-14 DIAGNOSIS — L570 Actinic keratosis: Secondary | ICD-10-CM | POA: Diagnosis not present

## 2020-02-14 DIAGNOSIS — D485 Neoplasm of uncertain behavior of skin: Secondary | ICD-10-CM | POA: Diagnosis not present

## 2020-02-14 DIAGNOSIS — D2271 Melanocytic nevi of right lower limb, including hip: Secondary | ICD-10-CM | POA: Diagnosis not present

## 2020-02-14 DIAGNOSIS — D2272 Melanocytic nevi of left lower limb, including hip: Secondary | ICD-10-CM | POA: Diagnosis not present

## 2020-02-14 DIAGNOSIS — D2261 Melanocytic nevi of right upper limb, including shoulder: Secondary | ICD-10-CM | POA: Diagnosis not present

## 2020-02-14 DIAGNOSIS — D045 Carcinoma in situ of skin of trunk: Secondary | ICD-10-CM | POA: Diagnosis not present

## 2020-02-14 DIAGNOSIS — D225 Melanocytic nevi of trunk: Secondary | ICD-10-CM | POA: Diagnosis not present

## 2020-02-14 DIAGNOSIS — L821 Other seborrheic keratosis: Secondary | ICD-10-CM | POA: Diagnosis not present

## 2020-02-14 DIAGNOSIS — C44212 Basal cell carcinoma of skin of right ear and external auricular canal: Secondary | ICD-10-CM | POA: Diagnosis not present

## 2020-02-14 DIAGNOSIS — D2262 Melanocytic nevi of left upper limb, including shoulder: Secondary | ICD-10-CM | POA: Diagnosis not present

## 2020-03-06 ENCOUNTER — Telehealth: Payer: Self-pay | Admitting: Cardiology

## 2020-03-06 NOTE — Telephone Encounter (Signed)
Returned the patients call. [email or web address]. 

## 2020-03-06 NOTE — Telephone Encounter (Signed)
Spoke to pt. He reports 2 episodes of feeling "like my heart is beating out of my chest". First occurred Saturday night 2/19 and second episode last night 2/21.  Episodes lasted about 3 seconds.  No shortness of breath.  Both times occurred at night and pt was already awake when occurred.  Pt did not check BP/HR during episodes.  Pt does have remote device check scheduled 3/4, and asked pt if he could go ahead and send transmission now d/t new onset symptoms.  Pt states he is not sure that he can send. I will forward to device clinic to make aware and to contact pt regarding device check.  Asked pt to call back in meantime if any further episodes occur.

## 2020-03-06 NOTE — Telephone Encounter (Signed)
Patient reports both events occurred while he was awake during early morning hours. Described events as "3 pounds in his chest a fingers length below the device" then stops. Denies symptoms pre or post beats. Explained to patient during the hours of midnight and 0500 am testing is performed on the wires to check measurements. Patient denies any complaints related to PNS. Remote transmission reivewed and no events logged during time frame of events. PAtient does have NSVT and SVT noted < 2 seconds, not a new finding for patient. Advised patient to monitor for recurrence or worsening and please call. Agreeable to plan.

## 2020-03-06 NOTE — Telephone Encounter (Signed)
Patient calling  States that sometimes his heart beat feels like it is jumping out of chest  He has noticed it the past few nights Please call to discuss

## 2020-03-13 ENCOUNTER — Telehealth: Payer: Self-pay | Admitting: Cardiology

## 2020-03-13 NOTE — Telephone Encounter (Signed)
Patient c/o Palpitations:  High priority if patient c/o lightheadedness, shortness of breath, or chest pain  1) How long have you had palpitations/irregular HR/ Afib? Are you having the symptoms now? Palpitations, felt them between 5:20am - 5:30am. States it felt like 6-8 spasms then 3 minutes later it happened again.  2) Are you currently experiencing lightheadedness, SOB or CP? No  3) Do you have a history of afib (atrial fibrillation) or irregular heart rhythm? yes  4) Have you checked your BP or HR? (document readings if available): BP 145/84 HR 73  5) Are you experiencing any other symptoms? Fatigue but states that this may be from working.

## 2020-03-13 NOTE — Telephone Encounter (Signed)
Per Dr. Quentin Ore- would like a remote check on Pt.  Pt will send tonight.  Will follow up tomorrow.  Pt has not had this spasm since last time he called in February.

## 2020-03-14 DIAGNOSIS — C44212 Basal cell carcinoma of skin of right ear and external auricular canal: Secondary | ICD-10-CM | POA: Diagnosis not present

## 2020-03-14 NOTE — Telephone Encounter (Signed)
Remote from 03/13/20 reviewed: device function WNL,  Presenting EGM SR @ 82 bpm, 1 episode of ST on 03/07/20 that lasted 1 second.

## 2020-03-15 NOTE — Telephone Encounter (Signed)
Patient states he is returning a call from today. 

## 2020-03-15 NOTE — Telephone Encounter (Signed)
Spoke to patient in regards to the feeling in his side. Reports of increased feeling of hiccups x1 month per patient. Advised patient we can bring him into the device clinic to test wires for PNS. Dr. Quentin Ore suggest it is automatic testing. I reached out of Ronalee Belts with [employer] and unfortunately, we are not able to change the time for automatic testing on wires for this device. Advised patient to call with further questions or concerns. Device Clinic apt made for 03/29/20 @ 3:20. Location, date and time discussed with patient.

## 2020-03-16 ENCOUNTER — Ambulatory Visit (INDEPENDENT_AMBULATORY_CARE_PROVIDER_SITE_OTHER): Payer: Medicare PPO

## 2020-03-16 DIAGNOSIS — I495 Sick sinus syndrome: Secondary | ICD-10-CM

## 2020-03-19 LAB — CUP PACEART REMOTE DEVICE CHECK
Battery Remaining Longevity: 158 mo
Battery Voltage: 3.16 V
Brady Statistic AP VP Percent: 0.12 %
Brady Statistic AP VS Percent: 83.58 %
Brady Statistic AS VP Percent: 0 %
Brady Statistic AS VS Percent: 16.3 %
Brady Statistic RA Percent Paced: 83.72 %
Brady Statistic RV Percent Paced: 0.12 %
Date Time Interrogation Session: 20220303213732
Implantable Lead Implant Date: 20210903
Implantable Lead Implant Date: 20210903
Implantable Lead Location: 753859
Implantable Lead Location: 753860
Implantable Lead Model: 5076
Implantable Lead Model: 5076
Implantable Pulse Generator Implant Date: 20210903
Lead Channel Impedance Value: 323 Ohm
Lead Channel Impedance Value: 380 Ohm
Lead Channel Impedance Value: 399 Ohm
Lead Channel Impedance Value: 437 Ohm
Lead Channel Pacing Threshold Amplitude: 0.5 V
Lead Channel Pacing Threshold Amplitude: 0.625 V
Lead Channel Pacing Threshold Pulse Width: 0.4 ms
Lead Channel Pacing Threshold Pulse Width: 0.4 ms
Lead Channel Sensing Intrinsic Amplitude: 1.25 mV
Lead Channel Sensing Intrinsic Amplitude: 1.25 mV
Lead Channel Sensing Intrinsic Amplitude: 6.375 mV
Lead Channel Sensing Intrinsic Amplitude: 6.375 mV
Lead Channel Setting Pacing Amplitude: 1.5 V
Lead Channel Setting Pacing Amplitude: 2 V
Lead Channel Setting Pacing Pulse Width: 0.4 ms
Lead Channel Setting Sensing Sensitivity: 1.2 mV

## 2020-03-21 DIAGNOSIS — D044 Carcinoma in situ of skin of scalp and neck: Secondary | ICD-10-CM | POA: Diagnosis not present

## 2020-03-23 ENCOUNTER — Other Ambulatory Visit: Payer: Self-pay | Admitting: Internal Medicine

## 2020-03-27 ENCOUNTER — Telehealth: Payer: Self-pay | Admitting: Emergency Medicine

## 2020-03-27 ENCOUNTER — Ambulatory Visit: Payer: Medicare PPO | Admitting: Cardiology

## 2020-03-27 ENCOUNTER — Other Ambulatory Visit: Payer: Self-pay

## 2020-03-27 ENCOUNTER — Encounter: Payer: Self-pay | Admitting: Cardiology

## 2020-03-27 VITALS — BP 126/82 | HR 73 | Ht 64.0 in | Wt 191.0 lb

## 2020-03-27 DIAGNOSIS — I495 Sick sinus syndrome: Secondary | ICD-10-CM

## 2020-03-27 DIAGNOSIS — I10 Essential (primary) hypertension: Secondary | ICD-10-CM

## 2020-03-27 MED ORDER — AMLODIPINE BESYLATE 2.5 MG PO TABS: 2.5000 mg | ORAL_TABLET | Freq: Every day | ORAL | 5 refills | Status: DC

## 2020-03-27 NOTE — Telephone Encounter (Signed)
Patient aware that he willl have to come to [facility] for reprogramming of his pacemaker. Agrees to attend visit. Directions given to lacation and where office is located in the building.

## 2020-03-27 NOTE — Progress Notes (Signed)
Cardiology Office Note:    Date:  03/27/2020   ID:  Bobby Woods, DOB 1945-07-21, MRN [id number]  PCP:  Venia Carbon, MD  [facility] Cardiologist:  [name], MD  [facility] Electrophysiologist:  Vickie Epley, MD   Referring MD: Venia Carbon, MD   Chief Complaint  Patient presents with  . Follow-up    6 months  Pt states he has been doing well    History of Present Illness:    Bobby Woods is a 75 y.o. male with a hx of GERD, SSS s/p PPM, hypertension who presents for follow-up.   Had pacemaker placed 09/16/2019.  Since then, he has noticed occasional hiccups.  Also has occasional prominent/strong heartbeats.  Has appointment with device clinic in 2 days.  Otherwise has overall fatigue with exertion.  He works at [employer], has noticed he needs occasional breaks to rest due to fatigue.  Denies chest pain, dizziness, syncope.   Prior notes Echocardiogram performed on 07/23/2019 showed normal systolic and diastolic function, EF 60 to 65%. 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 Coronary CTA, 08/2019 calcium score 0, no evidence of CAD.    Past Medical History:  Diagnosis Date  . Arthritis   . Blood transfusion without reported diagnosis    age 19 year   . BPH (benign prostatic hypertrophy)   . Chronic kidney disease   . GERD (gastroesophageal reflux disease)   . Hypertension    on medicines     Past Surgical History:  Procedure Laterality Date  . COLONOSCOPY  2006  . HAND SURGERY    . [place]  . PACEMAKER IMPLANT N/A 09/16/2019   Procedure: PACEMAKER IMPLANT;  Surgeon: Vickie Epley, MD;  Location: [place] [facility];  Service: Cardiovascular;  Laterality: N/A;  . PROSTATE BIOPSY  2011   negative  . [place]   left, right 05/07  . ROTATOR CUFF REPAIR  10/09   right  . TONSILLECTOMY  1972    Current Medications: Current Meds  Medication Sig  .  amLODipine (NORVASC) 2.5 MG tablet Take 1 tablet (2.5 mg total) by mouth daily.     Allergies:   Zithromax [azithromycin dihydrate]   Social History   Socioeconomic History  . Marital status: Married    Spouse name: Not on file  . Number of children: 3  . Years of education: Not on file  . Highest education level: Not on file  Occupational History  . 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 Occupation: Quality assurance--cleaning    Employer: [employer]    Comment:    . Occupation: Custodial/funeral assistance    Comment: [place]  Tobacco Use  . Smoking status: Never Smoker  . Smokeless tobacco: Never Used  Vaping Use  . Vaping Use: Never used  Substance and Sexual Activity  . Alcohol use: No  . Drug use: No  . Sexual activity: Not on file  Other Topics Concern  . Not on file  Social History Narrative   Has living will   Wife has [place] health care POA and alternate is [name] (cousin)   Would accept attempts at resuscitation but no prolonged artificial ventilation   Not sure about a feeding tube   Social Determinants of Health   Financial Resource Strain: Not on file  Food Insecurity: Not on file  Transportation Needs: Not  on file  Physical Activity: Not on file  Stress: Not on file  Social Connections: Not on file     Family History: The patient's family history includes Cancer in his father and paternal uncle. There is no history of Colon cancer, Rectal cancer, or Stomach cancer.  ROS:   Please see the history of present illness.     All other systems reviewed and are negative.  EKGs/Labs/Other Studies Reviewed:    The following studies were reviewed today:   EKG:  EKG is ordered today.  Atrial paced rhythm.  Recent Labs: 06/16/2019: ALT 19 10/11/2019: BUN 17; Creatinine, Ser 1.08; Hemoglobin 16.0; Platelets 199; Potassium 4.2; Sodium 140  Recent Lipid Panel    Component Value Date/Time   CHOL 106  07/20/2018 0959   TRIG 51.0 07/20/2018 0959   HDL 48.00 07/20/2018 0959   CHOLHDL 2 07/20/2018 0959   VLDL 10.2 07/20/2018 0959   LDLCALC 48 07/20/2018 0959    Physical Exam:    VS:  BP 126/82   Pulse 73   Ht 5\' 4"  (1.626 m)   Wt 191 lb (86.6 kg)   BMI 32.79 kg/m     Wt Readings from Last 3 Encounters:  03/27/20 191 lb (86.6 kg)  12/21/19 187 lb (84.8 kg)  10/20/19 179 lb (81.2 kg)     GEN:  Well nourished, well developed in no acute distress HEENT: Normal NECK: No JVD; No carotid bruits LYMPHATICS: No lymphadenopathy CARDIAC: RRR, no murmurs, rubs, gallops RESPIRATORY:  Clear to auscultation without rales, wheezing or rhonchi  ABDOMEN: Soft, non-tender, non-distended MUSCULOSKELETAL:  No edema; No deformity, chest tenderness noted on palpation. SKIN: Warm and dry NEUROLOGIC:  Alert and oriented x 3 PSYCHIATRIC:  Normal affect   ASSESSMENT:    1. Tachycardia-bradycardia syndrome ([place])   2. Essential hypertension    PLAN:    In order of problems listed above:  1. tachy bradycardia syndrome s/p permanent pacemaker.  Prominent heartbeats, hiccups since pacemaker placement.  Follows up with device clinic in 2 days..  Continue Toprol-XL 25 mg daily.  Keep appointments with device clinic for pacemaker check. 2. History of hypertension, blood pressure controlled.  Reduce amlodipine to 2.5 mg daily due to fatigue.  Continue Toprol-XL 25 mg daily  Follow-up in 3 months.  Total encounter time 40 minutes  Greater than 50% was spent in counseling and coordination of care with the patient   This note was generated in part or whole with voice recognition software. Voice recognition is usually quite accurate but there are transcription errors that can and very often do occur. I apologize for any typographical errors that were not detected and corrected.  Medication Adjustments/Labs and Tests Ordered: Current medicines are reviewed at length with the patient today.  Concerns  regarding medicines are outlined above.  Orders Placed This Encounter  Procedures  . EKG 12-Lead   Meds ordered this encounter  Medications  . amLODipine (NORVASC) 2.5 MG tablet    Sig: Take 1 tablet (2.5 mg total) by mouth daily.    Dispense:  30 tablet    Refill:  5    Patient Instructions  Medication Instructions:   Your physician has recommended you make the following change in your medication:   DECREASE your NORVASC to 2.5 MG  *If you need a refill on your cardiac medications before your next appointment, please call your pharmacy*   Lab Work: None ordered If you have labs (blood work) drawn today and your tests are  completely normal, you will receive your results only by: Marland Kitchen MyChart Message (if you have MyChart) OR . A paper copy in the mail If you have any lab test that is abnormal or we need to change your treatment, we will call you to review the results.   Testing/Procedures: None ordered   Follow-Up: At [facility], you and your health needs are our priority.  As part of our continuing mission to provide you with exceptional heart care, we have created designated Provider Care Teams.  These Care Teams include your primary Cardiologist (physician) and Advanced Practice Providers (APPs -  Physician Assistants and Nurse Practitioners) who all work together to provide you with the care you need, when you need it.  We recommend signing up for the patient portal called "MyChart".  Sign up information is provided on this After Visit Summary.  MyChart is used to connect with patients for Virtual Visits (Telemedicine).  Patients are able to view lab/test results, encounter notes, upcoming appointments, etc.  Non-urgent messages can be sent to your provider as well.   To learn more about what you can do with MyChart, go to [email or web address].    Your next appointment:   3 month(s)  The format for your next appointment:   In Person  Provider:   [name], MD   Other Instructions     Signed, [name], MD  03/27/2020 10:45 AM    [place]

## 2020-03-27 NOTE — Progress Notes (Signed)
Remote pacemaker transmission.   

## 2020-03-27 NOTE — Patient Instructions (Signed)
Medication Instructions:   Your physician has recommended you make the following change in your medication:   DECREASE your NORVASC to 2.5 MG  *If you need a refill on your cardiac medications before your next appointment, please call your pharmacy*   Lab Work: None ordered If you have labs (blood work) drawn today and your tests are completely normal, you will receive your results only by: Marland Kitchen MyChart Message (if you have MyChart) OR . A paper copy in the mail If you have any lab test that is abnormal or we need to change your treatment, we will call you to review the results.   Testing/Procedures: None ordered   Follow-Up: At [facility], you and your health needs are our priority.  As part of our continuing mission to provide you with exceptional heart care, we have created designated Provider Care Teams.  These Care Teams include your primary Cardiologist (physician) and Advanced Practice Providers (APPs -  Physician Assistants and Nurse Practitioners) who all work together to provide you with the care you need, when you need it.  We recommend signing up for the patient portal called "MyChart".  Sign up information is provided on this After Visit Summary.  MyChart is used to connect with patients for Virtual Visits (Telemedicine).  Patients are able to view lab/test results, encounter notes, upcoming appointments, etc.  Non-urgent messages can be sent to your provider as well.   To learn more about what you can do with MyChart, go to [email or web address].    Your next appointment:   3 month(s)  The format for your next appointment:   In Person  Provider:   [name], MD   Other Instructions

## 2020-03-29 ENCOUNTER — Ambulatory Visit (INDEPENDENT_AMBULATORY_CARE_PROVIDER_SITE_OTHER): Payer: Medicare PPO | Admitting: Emergency Medicine

## 2020-03-29 ENCOUNTER — Other Ambulatory Visit: Payer: Self-pay

## 2020-03-29 DIAGNOSIS — I495 Sick sinus syndrome: Secondary | ICD-10-CM

## 2020-03-29 LAB — CUP PACEART INCLINIC DEVICE CHECK
Battery Remaining Longevity: 158 mo
Battery Voltage: 3.15 V
Brady Statistic AP VP Percent: 0.05 %
Brady Statistic AP VS Percent: 76.19 %
Brady Statistic AS VP Percent: 0.01 %
Brady Statistic AS VS Percent: 23.75 %
Brady Statistic RA Percent Paced: 76.28 %
Brady Statistic RV Percent Paced: 0.06 %
Date Time Interrogation Session: 20220317164944
Implantable Lead Implant Date: 20210903
Implantable Lead Implant Date: 20210903
Implantable Lead Location: 753859
Implantable Lead Location: 753860
Implantable Lead Model: 5076
Implantable Lead Model: 5076
Implantable Pulse Generator Implant Date: 20210903
Lead Channel Impedance Value: 323 Ohm
Lead Channel Impedance Value: 361 Ohm
Lead Channel Impedance Value: 399 Ohm
Lead Channel Impedance Value: 399 Ohm
Lead Channel Pacing Threshold Amplitude: 0.5 V
Lead Channel Pacing Threshold Amplitude: 0.5 V
Lead Channel Pacing Threshold Pulse Width: 0.4 ms
Lead Channel Pacing Threshold Pulse Width: 0.4 ms
Lead Channel Sensing Intrinsic Amplitude: 2.625 mV
Lead Channel Sensing Intrinsic Amplitude: 6.25 mV
Lead Channel Setting Pacing Amplitude: 1.5 V
Lead Channel Setting Pacing Amplitude: 2 V
Lead Channel Setting Pacing Pulse Width: 0.4 ms
Lead Channel Setting Sensing Sensitivity: 1.2 mV

## 2020-03-29 NOTE — Progress Notes (Signed)
Pacemaker check in clinic secondary to concern for PMS during Adaptive testing. Device tested in high output mode. Patient sensitive to testing however does not describe as sensation that brought him to clinic today. Normal device function. Thresholds, sensing, impedances consistent with previous measurements. Device programmed to maximize longevity. No mode switch. 10 high ventricular rates noted, EGMs reviewed appear SVT. Upon discussion with patient timing of episodes correspond with dates and times of reported symptoms. Device programmed at appropriate safety margins. Histogram distribution appropriate for patient activity level. Device programmed to optimize intrinsic conduction. Estimated longevity 13.2 years. Patient enrolled in remote follow-up. Next scheduled check 06/15/20. Patient education completed.

## 2020-06-15 ENCOUNTER — Ambulatory Visit (INDEPENDENT_AMBULATORY_CARE_PROVIDER_SITE_OTHER): Payer: Medicare PPO

## 2020-06-15 DIAGNOSIS — I495 Sick sinus syndrome: Secondary | ICD-10-CM | POA: Diagnosis not present

## 2020-06-15 LAB — CUP PACEART REMOTE DEVICE CHECK
Battery Remaining Longevity: 154 mo
Battery Voltage: 3.1 V
Brady Statistic AP VP Percent: 0.05 %
Brady Statistic AP VS Percent: 81.68 %
Brady Statistic AS VP Percent: 0 %
Brady Statistic AS VS Percent: 18.27 %
Brady Statistic RA Percent Paced: 81.75 %
Brady Statistic RV Percent Paced: 0.05 %
Date Time Interrogation Session: 20220602191809
Implantable Lead Implant Date: 20210903
Implantable Lead Implant Date: 20210903
Implantable Lead Location: 753859
Implantable Lead Location: 753860
Implantable Lead Model: 5076
Implantable Lead Model: 5076
Implantable Pulse Generator Implant Date: 20210903
Lead Channel Impedance Value: 304 Ohm
Lead Channel Impedance Value: 323 Ohm
Lead Channel Impedance Value: 380 Ohm
Lead Channel Impedance Value: 380 Ohm
Lead Channel Pacing Threshold Amplitude: 0.625 V
Lead Channel Pacing Threshold Amplitude: 0.75 V
Lead Channel Pacing Threshold Pulse Width: 0.4 ms
Lead Channel Pacing Threshold Pulse Width: 0.4 ms
Lead Channel Sensing Intrinsic Amplitude: 2.25 mV
Lead Channel Sensing Intrinsic Amplitude: 2.25 mV
Lead Channel Sensing Intrinsic Amplitude: 7.125 mV
Lead Channel Sensing Intrinsic Amplitude: 7.125 mV
Lead Channel Setting Pacing Amplitude: 1.5 V
Lead Channel Setting Pacing Amplitude: 2 V
Lead Channel Setting Pacing Pulse Width: 0.4 ms
Lead Channel Setting Sensing Sensitivity: 1.2 mV

## 2020-07-03 ENCOUNTER — Other Ambulatory Visit: Payer: Self-pay

## 2020-07-03 ENCOUNTER — Encounter: Payer: Self-pay | Admitting: Cardiology

## 2020-07-03 ENCOUNTER — Ambulatory Visit: Payer: Medicare PPO | Admitting: Cardiology

## 2020-07-03 VITALS — BP 122/80 | HR 66 | Ht 64.0 in | Wt 186.0 lb

## 2020-07-03 DIAGNOSIS — I10 Essential (primary) hypertension: Secondary | ICD-10-CM

## 2020-07-03 DIAGNOSIS — I495 Sick sinus syndrome: Secondary | ICD-10-CM

## 2020-07-03 MED ORDER — METOPROLOL SUCCINATE ER 25 MG PO TB24
12.5000 mg | ORAL_TABLET | Freq: Every day | ORAL | 3 refills | Status: DC
Start: 2020-07-03 — End: 2021-03-26

## 2020-07-03 NOTE — Patient Instructions (Signed)
Medication Instructions:   Your physician has recommended you make the following change in your medication:    STOP taking your Amlodipine.   DECREASE your Toprol XL to 12.5 MG daily.   *If you need a refill on your cardiac medications before your next appointment, please call your pharmacy*   Lab Work: None ordered If you have labs (blood work) drawn today and your tests are completely normal, you will receive your results only by: [place] (if you have MyChart) OR A paper copy in the mail If you have any lab test that is abnormal or we need to change your treatment, we will call you to review the results.   Testing/Procedures: None ordered   Follow-Up: At [facility], you and your health needs are our priority.  As part of our continuing mission to provide you with exceptional heart care, we have created designated Provider Care Teams.  These Care Teams include your primary Cardiologist (physician) and Advanced Practice Providers (APPs -  Physician Assistants and Nurse Practitioners) who all work together to provide you with the care you need, when you need it.  We recommend signing up for the patient portal called "MyChart".  Sign up information is provided on this After Visit Summary.  MyChart is used to connect with patients for Virtual Visits (Telemedicine).  Patients are able to view lab/test results, encounter notes, upcoming appointments, etc.  Non-urgent messages can be sent to your provider as well.   To learn more about what you can do with MyChart, go to [email or web address].    Your next appointment:   3 month(s)  The format for your next appointment:   In Person  Provider:   [name], MD   Other Instructions

## 2020-07-03 NOTE — Progress Notes (Signed)
Cardiology Office Note:    Date:  07/03/2020   ID:  [name], DOB Mar 17, 1945, MRN [id number]  PCP:  Venia Carbon, MD  [facility] Cardiologist:  [name], MD  [place] Electrophysiologist:  Vickie Epley, MD   Referring MD: Venia Carbon, MD   Chief Complaint  Patient presents with   Other    3 month follow up - Patient c.o chest pain in the center of his chest. Meds reviewed verbally with patient.      History of Present Illness:    Bobby Woods is a 75 y.o. male with a hx of GERD, SSS s/p PPM, hypertension who presents for follow-up.   Had complaints of fatigue after last visit.  Amlodipine was decreased to 2.5 mg daily.  Toprol-XL was continued at 25 mg daily.  Had pacemaker placed 09/16/2019.  He still has symptoms of fatigue, also notes some balance issues which he attributes to an old stroke diagnosed after head CT in September 2021 which showed old infarct in the medial left occipital lobe.    Prior notes Echocardiogram performed on 07/23/2019 showed normal systolic and diastolic function, EF 60 to 65%. 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 Coronary CTA, 08/2019 calcium score 0, no evidence of CAD.    Past Medical History:  Diagnosis Date   Arthritis    Blood transfusion without reported diagnosis    age 58 year    BPH (benign prostatic hypertrophy)    Chronic kidney disease    GERD (gastroesophageal reflux disease)    Hypertension    on medicines     Past Surgical History:  Procedure Laterality Date   COLONOSCOPY  2006   [place]   PACEMAKER IMPLANT N/A 09/16/2019   Procedure: PACEMAKER IMPLANT;  Surgeon: Vickie Epley, MD;  Location: [facility];  Service: Cardiovascular;  Laterality: N/A;   PROSTATE BIOPSY  2011   negative   RETINAL DETACHMENT SURGERY  1985   left, right 05/07   ROTATOR CUFF REPAIR  10/09   right   TONSILLECTOMY  1972    Current  Medications: Current Meds  Medication Sig   finasteride (PROSCAR) 5 MG tablet TAKE 1 TABLET BY MOUTH EVERY DAY   pantoprazole (PROTONIX) 40 MG tablet Take 1 tablet (40 mg total) by mouth daily.   tamsulosin (FLOMAX) 0.4 MG CAPS capsule Take 1 capsule (0.4 mg total) by mouth daily after supper.   [DISCONTINUED] amLODipine (NORVASC) 2.5 MG tablet Take 1 tablet (2.5 mg total) by mouth daily.   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    Employer: [employer]   Occupation: Quality assurance--cleaning    Employer: [employer]    Comment:     Occupation: Custodial/funeral assistance    Comment: [employer]  Tobacco Use   Smoking status: Never   Smokeless tobacco: Never  Vaping Use   Vaping Use: Never used  Substance and Sexual Activity   Alcohol use: No   Drug use:  No   Sexual activity: Not on file  Other Topics Concern   Not on file  Social History Narrative   Has living will   Wife has [place] health care POA and alternate is [name] (cousin)   Would accept attempts at resuscitation but no prolonged artificial ventilation   Not sure about a feeding tube   Social Determinants of Health   Financial Resource Strain: Not on file  Food Insecurity: Not on file  Transportation Needs: Not on file  Physical Activity: Not on file  Stress: Not on file  Social Connections: Not on file     Family History: The patient's family history includes Cancer in his father and paternal uncle. There is no history of Colon cancer, Rectal cancer, or Stomach cancer.  ROS:   Please see the history of present  illness.     All other systems reviewed and are negative.  EKGs/Labs/Other Studies Reviewed:    The following studies were reviewed today:   EKG:  EKG is ordered today.  Sinus rhythm, occasional atrial paced rhythm.  Recent Labs: 10/11/2019: BUN 17; Creatinine, Ser 1.08; Hemoglobin 16.0; Platelets 199; Potassium 4.2; Sodium 140  Recent Lipid Panel    Component Value Date/Time   CHOL 106 07/20/2018 0959   TRIG 51.0 07/20/2018 0959   HDL 48.00 07/20/2018 0959   CHOLHDL 2 07/20/2018 0959   VLDL 10.2 07/20/2018 0959   LDLCALC 48 07/20/2018 0959    Physical Exam:    VS:  BP 122/80 (BP Location: Left Arm, Patient Position: Sitting, Cuff Size: Normal)   Pulse 66   Ht 5\' 4"  (1.626 m)   Wt 186 lb (84.4 kg)   SpO2 96%   BMI 31.93 kg/m     Wt Readings from Last 3 Encounters:  07/03/20 186 lb (84.4 kg)  03/27/20 191 lb (86.6 kg)  12/21/19 187 lb (84.8 kg)     GEN:  Well nourished, well developed in no acute distress HEENT: Normal NECK: No JVD; No carotid bruits LYMPHATICS: No lymphadenopathy CARDIAC: RRR, no murmurs, rubs, gallops RESPIRATORY:  Clear to auscultation without rales, wheezing or rhonchi  ABDOMEN: Soft, non-tender, non-distended MUSCULOSKELETAL:  No edema; No deformity, chest tenderness noted on palpation. SKIN: Warm and dry NEUROLOGIC:  Alert and oriented x 3 PSYCHIATRIC:  Normal affect   ASSESSMENT:    1. Tachycardia-bradycardia syndrome ([place])   2. Primary hypertension     PLAN:    In order of problems listed above:  tachy bradycardia syndrome s/p permanent pacemaker.  Keep follow-up appointment with device clinic for frequent checks.  Reduce Toprol-XL to 12.5 mg daily. History of hypertension, blood pressure controlled.  Stop amlodipine, reduce Toprol-XL to 12.5 mg daily due to fatigue.  Follow-up in 3 months.  Total encounter time 30 minutes  Greater than 50% was spent in counseling and coordination of care with the patient   This note was  generated in part or whole with voice recognition software. Voice recognition is usually quite accurate but there are transcription errors that can and very often do occur. I apologize for any typographical errors that were not detected and corrected.  Medication Adjustments/Labs and Tests Ordered: Current medicines are reviewed at length with the patient today.  Concerns regarding medicines are outlined above.  Orders Placed This Encounter  Procedures   EKG 12-Lead    Meds ordered this encounter  Medications   metoprolol succinate (TOPROL-XL) 25 MG 24 hr tablet    Sig: Take 0.5 tablets (12.5 mg  total) by mouth daily.    Dispense:  90 tablet    Refill:  3     Patient Instructions  Medication Instructions:   Your physician has recommended you make the following change in your medication:    STOP taking your Amlodipine.   DECREASE your Toprol XL to 12.5 MG daily.   *If you need a refill on your cardiac medications before your next appointment, please call your pharmacy*   Lab Work: None ordered If you have labs (blood work) drawn today and your tests are completely normal, you will receive your results only by: [place] (if you have MyChart) OR A paper copy in the mail If you have any lab test that is abnormal or we need to change your treatment, we will call you to review the results.   Testing/Procedures: None ordered   Follow-Up: At [facility], you and your health needs are our priority.  As part of our continuing mission to provide you with exceptional heart care, we have created designated Provider Care Teams.  These Care Teams include your primary Cardiologist (physician) and Advanced Practice Providers (APPs -  Physician Assistants and Nurse Practitioners) who all work together to provide you with the care you need, when you need it.  We recommend signing up for the patient portal called "MyChart".  Sign up information is provided on this After Visit  Summary.  MyChart is used to connect with patients for Virtual Visits (Telemedicine).  Patients are able to view lab/test results, encounter notes, upcoming appointments, etc.  Non-urgent messages can be sent to your provider as well.   To learn more about what you can do with MyChart, go to [email or web address].    Your next appointment:   3 month(s)  The format for your next appointment:   In Person  Provider:   [name], MD   Other Instructions    Signed, [name], MD  07/03/2020 11:28 AM    [place]

## 2020-07-03 NOTE — Progress Notes (Signed)
Remote pacemaker transmission.   

## 2020-07-27 ENCOUNTER — Other Ambulatory Visit: Payer: Self-pay

## 2020-07-27 ENCOUNTER — Encounter: Payer: Self-pay | Admitting: Internal Medicine

## 2020-07-27 ENCOUNTER — Ambulatory Visit (INDEPENDENT_AMBULATORY_CARE_PROVIDER_SITE_OTHER): Payer: Medicare PPO | Admitting: Internal Medicine

## 2020-07-27 VITALS — BP 124/86 | HR 75 | Temp 97.9°F | Ht 63.5 in | Wt 184.0 lb

## 2020-07-27 DIAGNOSIS — M545 Low back pain, unspecified: Secondary | ICD-10-CM | POA: Diagnosis not present

## 2020-07-27 DIAGNOSIS — I495 Sick sinus syndrome: Secondary | ICD-10-CM

## 2020-07-27 DIAGNOSIS — I10 Essential (primary) hypertension: Secondary | ICD-10-CM | POA: Diagnosis not present

## 2020-07-27 DIAGNOSIS — K219 Gastro-esophageal reflux disease without esophagitis: Secondary | ICD-10-CM

## 2020-07-27 DIAGNOSIS — G8929 Other chronic pain: Secondary | ICD-10-CM | POA: Diagnosis not present

## 2020-07-27 DIAGNOSIS — N1831 Chronic kidney disease, stage 3a: Secondary | ICD-10-CM | POA: Diagnosis not present

## 2020-07-27 DIAGNOSIS — Z0000 Encounter for general adult medical examination without abnormal findings: Secondary | ICD-10-CM

## 2020-07-27 LAB — CBC
HCT: 44.1 % (ref 39.0–52.0)
Hemoglobin: 15.3 g/dL (ref 13.0–17.0)
MCHC: 34.7 g/dL (ref 30.0–36.0)
MCV: 90.7 fl (ref 78.0–100.0)
Platelets: 179 10*3/uL (ref 150.0–400.0)
RBC: 4.86 Mil/uL (ref 4.22–5.81)
RDW: 13.4 % (ref 11.5–15.5)
WBC: 6.3 10*3/uL (ref 4.0–10.5)

## 2020-07-27 LAB — COMPREHENSIVE METABOLIC PANEL
ALT: 20 U/L (ref 0–53)
AST: 23 U/L (ref 0–37)
Albumin: 4.5 g/dL (ref 3.5–5.2)
Alkaline Phosphatase: 55 U/L (ref 39–117)
BUN: 17 mg/dL (ref 6–23)
CO2: 28 mEq/L (ref 19–32)
Calcium: 9.3 mg/dL (ref 8.4–10.5)
Chloride: 103 mEq/L (ref 96–112)
Creatinine, Ser: 1.4 mg/dL (ref 0.40–1.50)
GFR: 49.44 mL/min — ABNORMAL LOW (ref >60.00)
Glucose, Bld: 103 mg/dL — ABNORMAL HIGH (ref 70–99)
Potassium: 3.9 mEq/L (ref 3.5–5.1)
Sodium: 139 mEq/L (ref 135–145)
Total Bilirubin: 1 mg/dL (ref 0.2–1.2)
Total Protein: 7 g/dL (ref 6.0–8.3)

## 2020-07-27 LAB — LIPID PANEL
Cholesterol: 110 mg/dL (ref 0–200)
HDL: 42.3 mg/dL (ref >39.00)
LDL Cholesterol: 52 mg/dL (ref 0–99)
NonHDL: 67.67
Total CHOL/HDL Ratio: 3
Triglycerides: 77 mg/dL (ref 0.0–149.0)
VLDL: 15.4 mg/dL (ref 0.0–40.0)

## 2020-07-27 MED ORDER — MELOXICAM 15 MG PO TABS: 15.0000 mg | ORAL_TABLET | Freq: Every day | ORAL | 1 refills | Status: DC | PRN

## 2020-07-27 NOTE — Assessment & Plan Note (Signed)
Doing well on the protonix

## 2020-07-27 NOTE — Assessment & Plan Note (Signed)
BP Readings from Last 3 Encounters:  07/27/20 124/86  07/03/20 122/80  03/27/20 126/82   Controlled with low dose metoprolol

## 2020-07-27 NOTE — Assessment & Plan Note (Signed)
Chronic Will allow prn meloxicam

## 2020-07-27 NOTE — Assessment & Plan Note (Signed)
I have personally reviewed the Medicare Annual Wellness questionnaire and have noted 1. The patient's medical and social history 2. Their use of alcohol, tobacco or illicit drugs 3. Their current medications and supplements 4. The patient's functional ability including ADL's, fall risks, home safety risks and hearing or visual             impairment. 5. Diet and physical activities 6. Evidence for depression or mood disorders  The patients weight, height, BMI and visual acuity have been recorded in the chart I have made referrals, counseling and provided education to the patient based review of the above and I have provided the pt with a written personalized care plan for preventive services.  I have provided you with a copy of your personalized plan for preventive services. Please take the time to review along with your updated medication list.  Second COVID booster later this year Flu vaccine in the fall Hold off on shingrix (cost) Needs last colonoscopy now No PSA due to age (mildly elevated but stable for years) exercise

## 2020-07-27 NOTE — Progress Notes (Signed)
Hearing Screening  Method: Audiometry   500Hz  1000Hz  2000Hz  4000Hz   Right ear 20 20 20  0  Left ear 20 20 20  40  Vision Screening - Comments:: January 2022

## 2020-07-27 NOTE — Assessment & Plan Note (Signed)
Better with the pacer

## 2020-07-27 NOTE — Assessment & Plan Note (Signed)
Was better off the meloxicam Will allow prn use and recheck in a few months

## 2020-07-27 NOTE — Patient Instructions (Signed)
Please call the GI doctor to set up one last colonoscopy.

## 2020-07-27 NOTE — Progress Notes (Signed)
Subjective:    Patient ID: Bobby Woods, male    DOB: Jun 07, 1945, 75 y.o.   MRN: [id number]  HPI Here for Medicare wellness visit and follow up of chronic health conditions This visit occurred during the SARS-CoV-2 public health emergency.  Safety protocols were in place, including screening questions prior to the visit, additional usage of staff PPE, and extensive cleaning of exam room while observing appropriate contact time as indicated for disinfecting solutions.   Reviewed form and advanced directives Reviewed other doctors No alcohol or tobacco Tries to exercise Had fall this year---ER visit and maxillary fracture Works 2 days per week for [employer]. Hasn't worked at [employer] for quite a few months Vision is okay Hearing is okay---wife thinks he can't hear well Some depressed mood at times----lots going on and stress. Not anhedonic Independent with instrumental ADLs No sig memory issues  Dizzy spells are better now Had pacemaker Had to cut the metoprolol dose ---was "wiped out" Will have some brief chest pain--did review with Dr Charlestine Night  No heartburn trouble on protonix No dysphagia  GFR in 50's--did improve off the meloxicam  Voiding better on dual therapy Nocturia x 1-2 now Flow is good and he empties fine  Ongoing back problems The meloxicam helps that Stiff after sitting --but he does loosen up  Current Outpatient Medications on File Prior to Visit  Medication Sig Dispense Refill   finasteride (PROSCAR) 5 MG tablet TAKE 1 TABLET BY MOUTH EVERY DAY 90 tablet 3   metoprolol succinate (TOPROL-XL) 25 MG 24 hr tablet Take 0.5 tablets (12.5 mg total) by mouth daily. 90 tablet 3   pantoprazole (PROTONIX) 40 MG tablet Take 1 tablet (40 mg total) by mouth daily. 30 tablet 11   tamsulosin (FLOMAX) 0.4 MG CAPS capsule Take 1 capsule (0.4 mg total) by mouth daily after supper. 90 capsule 3   No current facility-administered medications on file prior to visit.     Allergies  Allergen Reactions   Zithromax [Azithromycin Dihydrate]     GI upset    Past Medical History:  Diagnosis Date   Arthritis    Blood transfusion without reported diagnosis    age 3 year    BPH (benign prostatic hypertrophy)    Chronic kidney disease    GERD (gastroesophageal reflux disease)    Hypertension    on medicines     Past Surgical History:  Procedure Laterality Date   COLONOSCOPY  2006   [place]   PACEMAKER IMPLANT N/A 09/16/2019   Procedure: PACEMAKER IMPLANT;  Surgeon: Vickie Epley, MD;  Location: [facility];  Service: Cardiovascular;  Laterality: N/A;   PROSTATE BIOPSY  2011   negative   RETINAL DETACHMENT SURGERY  1985   left, right 05/07   ROTATOR CUFF REPAIR  10/09   right   TONSILLECTOMY  1972    Family History  Problem Relation Age of Onset   Cancer Father        prostate   Cancer Paternal Uncle        prostate cancer   Colon cancer Neg Hx    Rectal cancer Neg Hx    Stomach cancer Neg Hx     Social History   Socioeconomic History   Marital status: Married    Spouse name: Not on file   Number of children: 3   Years of education: Not on file   Highest education level:  Not on file  Occupational History   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    Employer: [employer]   Occupation: Quality assurance--cleaning    Employer: [employer]    Comment:     Occupation: Custodial/funeral assistance    Comment: Financial controller  Tobacco Use   Smoking status: Never   Smokeless tobacco: Never  Vaping Use   Vaping Use: Never used  Substance and Sexual Activity   Alcohol use: No   Drug use: No   Sexual activity: Not on file  Other Topics Concern   Not on file  Social History Narrative   Has living will   Wife has [place] health care POA and alternate is Paramedic (cousin)   Would accept attempts at resuscitation but no prolonged artificial ventilation   Not sure about  a feeding tube   Social Determinants of Health   Financial Resource Strain: Not on file  Food Insecurity: Not on file  Transportation Needs: Not on file  Physical Activity: Not on file  Stress: Not on file  Social Connections: Not on file  Intimate Partner Violence: Not on file   Review of Systems Appetite is good Weight is stable Sleeps well Wears seat belt Teeth are okay---keeps up with dentist No suspicious skin lesions Bowels are fine--no blood     Objective:   Physical Exam Constitutional:      Appearance: Normal appearance.  HENT:     Mouth/Throat:     Comments: No lesions Eyes:     Conjunctiva/sclera: Conjunctivae normal.     Comments: Abnormal left pupil  Cardiovascular:     Rate and Rhythm: Normal rate and regular rhythm.     Pulses: Normal pulses.     Heart sounds: No murmur heard.   No gallop.  Pulmonary:     Effort: Pulmonary effort is normal.     Breath sounds: Normal breath sounds. No wheezing or rales.  Abdominal:     Palpations: Abdomen is soft.     Tenderness: There is no abdominal tenderness.  Musculoskeletal:     Cervical back: Neck supple.     Right lower leg: No edema.     Left lower leg: No edema.  Lymphadenopathy:     Cervical: No cervical adenopathy.  Skin:    General: Skin is warm.     Findings: No rash.  Neurological:     Mental Status: He is alert and oriented to person, place, and time.     Comments: President---"Biden, Trump, Obama" [id number] Recall 3/3  Psychiatric:        Mood and Affect: Mood normal.        Behavior: Behavior normal.           Assessment & Plan:

## 2020-08-06 ENCOUNTER — Telehealth: Payer: Self-pay | Admitting: Cardiology

## 2020-08-06 NOTE — Telephone Encounter (Signed)
Pt c/o BP issue: STAT if pt c/o blurred vision, one-sided weakness or slurred speech  1. What are your last 5 BP readings? 7/23-148/93 7/24-157/96 This morning 160/97  2. Are you having any other symptoms (ex. Dizziness, headache, blurred vision, passed out)? Slight headache on 7/23.,  3. What is your BP issue? elevated  Patieny started taking Norvasc again  Please call to discuss.,

## 2020-08-06 NOTE — Telephone Encounter (Signed)
Spoke with patient and he started taking his old prescription of Norvasc 5 MG on Saturday after he checked his BP and it was 148/93. He said he did have a bit of a headache that day, but denies any CP or SOB. He also took the Norvasc on Sunday morning and again this morning, however both of the  BP readings (157/96 -Sunday, 160/97- This AM) were taken before he took the medication. He takes his Toprol XL 12.5 MG every evening.   Dr. Garen Lah stopped the Norvasc and reduced his Toprol XL at his last office visit on 07/03/20 due to fatigue and good BP control.  I advised patient to go ahead and take Norvasc 5 MG again in the AM and then re-check his BP 1-1.5 hours after taking the medication, then call me to let me know where his BP is at.

## 2020-08-06 NOTE — Telephone Encounter (Signed)
Patient calling back to state he is available before 12 and after 4 pm

## 2020-08-07 ENCOUNTER — Telehealth: Payer: Self-pay | Admitting: Cardiology

## 2020-08-07 MED ORDER — AMLODIPINE BESYLATE 5 MG PO TABS: 5.0000 mg | ORAL_TABLET | Freq: Every day | ORAL | 3 refills | Status: DC

## 2020-08-07 NOTE — Telephone Encounter (Signed)
Patient is calling back with his BP reading for today 160/90

## 2020-08-07 NOTE — Telephone Encounter (Signed)
See telephone encounter for 08/06/20 for continuity of call. Closing encounter.

## 2020-08-07 NOTE — Telephone Encounter (Signed)
With only recent resumption of amlodipine, I favor continuation of '5mg'$  daily w/ bp checks once daily.  He can provide [place] his trends in 1-2 wks at which point we can consider additional titration.

## 2020-08-07 NOTE — Telephone Encounter (Signed)
Called patient and relayed Bobby Woods recommendation as seen below. Patient verbalized understanding, and agreed with plan. He will update [place] on his BP readings on 08/15/20 which will be 11 days from his restart date.

## 2020-08-07 NOTE — Telephone Encounter (Signed)
Patient called back with his BP as requested. His reading today 1 hour after taking the Norvasc was 160/90.    Will route to Ignacia Bayley for recommendations.

## 2020-08-14 DIAGNOSIS — L820 Inflamed seborrheic keratosis: Secondary | ICD-10-CM | POA: Diagnosis not present

## 2020-08-14 DIAGNOSIS — D225 Melanocytic nevi of trunk: Secondary | ICD-10-CM | POA: Diagnosis not present

## 2020-08-14 DIAGNOSIS — D485 Neoplasm of uncertain behavior of skin: Secondary | ICD-10-CM | POA: Diagnosis not present

## 2020-08-14 DIAGNOSIS — C44212 Basal cell carcinoma of skin of right ear and external auricular canal: Secondary | ICD-10-CM | POA: Diagnosis not present

## 2020-08-14 DIAGNOSIS — Z85828 Personal history of other malignant neoplasm of skin: Secondary | ICD-10-CM | POA: Diagnosis not present

## 2020-08-14 DIAGNOSIS — L570 Actinic keratosis: Secondary | ICD-10-CM | POA: Diagnosis not present

## 2020-08-14 DIAGNOSIS — L821 Other seborrheic keratosis: Secondary | ICD-10-CM | POA: Diagnosis not present

## 2020-08-15 NOTE — Telephone Encounter (Signed)
Patient is calling with BP readings 7/27-08/14/20.  Patients states reading were done 1hr 92mn after taking medication  124/67 127/80 147/82 151/90 134/82 133/79 131/74

## 2020-08-17 NOTE — Telephone Encounter (Signed)
Called patient and relayed the below recommendations:  [name], MD  Kavin Leech, RN Caller: Unspecified (1 week ago) Continue blood pressure medications as prescribed.

## 2020-09-14 ENCOUNTER — Ambulatory Visit (INDEPENDENT_AMBULATORY_CARE_PROVIDER_SITE_OTHER): Payer: Medicare PPO

## 2020-09-14 DIAGNOSIS — I495 Sick sinus syndrome: Secondary | ICD-10-CM

## 2020-09-18 LAB — CUP PACEART REMOTE DEVICE CHECK
Battery Remaining Longevity: 150 mo
Battery Voltage: 3.06 V
Brady Statistic AP VP Percent: 0.06 %
Brady Statistic AP VS Percent: 79.38 %
Brady Statistic AS VP Percent: 0.01 %
Brady Statistic AS VS Percent: 20.55 %
Brady Statistic RA Percent Paced: 79.48 %
Brady Statistic RV Percent Paced: 0.06 %
Date Time Interrogation Session: 20220902051450
Implantable Lead Implant Date: 20210903
Implantable Lead Implant Date: 20210903
Implantable Lead Location: 753859
Implantable Lead Location: 753860
Implantable Lead Model: 5076
Implantable Lead Model: 5076
Implantable Pulse Generator Implant Date: 20210903
Lead Channel Impedance Value: 304 Ohm
Lead Channel Impedance Value: 323 Ohm
Lead Channel Impedance Value: 361 Ohm
Lead Channel Impedance Value: 380 Ohm
Lead Channel Pacing Threshold Amplitude: 0.5 V
Lead Channel Pacing Threshold Amplitude: 0.625 V
Lead Channel Pacing Threshold Pulse Width: 0.4 ms
Lead Channel Pacing Threshold Pulse Width: 0.4 ms
Lead Channel Sensing Intrinsic Amplitude: 1.375 mV
Lead Channel Sensing Intrinsic Amplitude: 1.375 mV
Lead Channel Sensing Intrinsic Amplitude: 6.625 mV
Lead Channel Sensing Intrinsic Amplitude: 6.625 mV
Lead Channel Setting Pacing Amplitude: 1.5 V
Lead Channel Setting Pacing Amplitude: 2 V
Lead Channel Setting Pacing Pulse Width: 0.4 ms
Lead Channel Setting Sensing Sensitivity: 1.2 mV

## 2020-09-25 ENCOUNTER — Other Ambulatory Visit: Payer: Self-pay | Admitting: Internal Medicine

## 2020-09-25 NOTE — Progress Notes (Signed)
Remote pacemaker transmission.   

## 2020-09-26 ENCOUNTER — Other Ambulatory Visit: Payer: Self-pay

## 2020-09-26 ENCOUNTER — Ambulatory Visit (INDEPENDENT_AMBULATORY_CARE_PROVIDER_SITE_OTHER): Payer: Medicare PPO | Admitting: Cardiology

## 2020-09-26 ENCOUNTER — Encounter: Payer: Self-pay | Admitting: Cardiology

## 2020-09-26 ENCOUNTER — Telehealth: Payer: Self-pay

## 2020-09-26 VITALS — BP 112/92 | HR 68 | Ht 63.5 in | Wt 179.0 lb

## 2020-09-26 DIAGNOSIS — Z950 Presence of cardiac pacemaker: Secondary | ICD-10-CM | POA: Diagnosis not present

## 2020-09-26 DIAGNOSIS — I10 Essential (primary) hypertension: Secondary | ICD-10-CM | POA: Diagnosis not present

## 2020-09-26 DIAGNOSIS — I495 Sick sinus syndrome: Secondary | ICD-10-CM

## 2020-09-26 NOTE — Telephone Encounter (Signed)
[facility] - Client TELEPHONE ADVICE RECORD [facility] Patient Name: Bobby Woods Gender: Male DOB: August 03, 1945 Age: 75 Y 10 M 21 D Return Phone Number: [phone] (Primary), [phone] (Secondary) Address: City/ State/ Zip: [name] [address] Client [facility] - Client Client Site [place] - Day Physician Viviana Simpler- MD Contact Type Call Who Is Calling Patient / Member / Family / Caregiver Call Type Triage / Clinical Relationship To Patient Self Return Phone Number [phone] (Primary) Chief Complaint Flank Pain Reason for Call Symptomatic / Request for [place] states he has Sx of urination frequency, flank pain/discomfort. He had a dry mouth yesterday. Translation No Nurse Assessment Nurse: Rolena Infante, RN, [place] Date/Time (Eastern Time): 09/26/2020 9:01:04 AM Confirm and document reason for call. If symptomatic, describe symptoms. ---Caller states he is having urinary frequency and his right side of abdomen has a lump doing the night. No lump this am. It feels sore. Symptoms began yesterday. Does the patient have any new or worsening symptoms? ---Yes Will a triage be completed? ---Yes Related visit to physician within the last 2 weeks? ---No Does the PT have any chronic conditions? (i.e. diabetes, asthma, this includes High risk factors for pregnancy, etc.) ---Yes List chronic conditions. ---HTN. Pacemaker Is this a behavioral health or substance abuse call? ---No Guidelines Guideline Title Affirmed Question Affirmed Notes Nurse Date/Time Eilene Ghazi Time) Urinary Symptoms Side (flank) or lower back pain present Rolena Infante, RN, Patrice 09/26/2020 9:04:54 AM Disp. Time Eilene Ghazi Time) Disposition Final User 09/26/2020 9:09:21 AM See PCP within 24 Hours Yes Rolena Infante, RN, Patrice PLEASE NOTE: All timestamps contained within this report are  represented as [place] Standard Time. CONFIDENTIALTY NOTICE: This fax transmission is intended only for the addressee. It contains information that is legally privileged, confidential or otherwise protected from use or disclosure. If you are not the intended recipient, you are strictly prohibited from reviewing, disclosing, copying using or disseminating any of this information or taking any action in reliance on or regarding this information. If you have received this fax in error, please notify us immediately by telephone so that we can arrange for its return to [place]. Phone: [phone], Toll-Free: [phone], Fax: [phone] Page: 2 of 2 Call Id: [id number] [place] Disagree/Comply Comply Caller Understands Yes PreDisposition Call Doctor Care Advice Given Per Guideline SEE PCP WITHIN 24 HOURS: * IF OFFICE WILL BE OPEN: You need to be examined within the next 24 hours. Call your doctor (or NP/PA) when the office opens and make an appointment. PAIN MEDICINES: CALL BACK IF: * Fever occurs * Unable to urinate and bladder feels full * You become worse Referrals REFERRED TO PCP OFFICE

## 2020-09-26 NOTE — Patient Instructions (Signed)
Medication Instructions:  Your physician recommends that you continue on your current medications as directed. Please refer to the Current Medication list given to you today.  *If you need a refill on your cardiac medications before your next appointment, please call your pharmacy*   Lab Work: None ordered.  If you have labs (blood work) drawn today and your tests are completely normal, you will receive your results only by: [place] (if you have MyChart) OR A paper copy in the mail If you have any lab test that is abnormal or we need to change your treatment, we will call you to review the results.   Testing/Procedures: None ordered.    Follow-Up: At [facility], you and your health needs are our priority.  As part of our continuing mission to provide you with exceptional heart care, we have created designated Provider Care Teams.  These Care Teams include your primary Cardiologist (physician) and Advanced Practice Providers (APPs -  Physician Assistants and Nurse Practitioners) who all work together to provide you with the care you need, when you need it.  We recommend signing up for the patient portal called "MyChart".  Sign up information is provided on this After Visit Summary.  MyChart is used to connect with patients for Virtual Visits (Telemedicine).  Patients are able to view lab/test results, encounter notes, upcoming appointments, etc.  Non-urgent messages can be sent to your provider as well.   To learn more about what you can do with MyChart, go to [email or web address].    Your next appointment:   12 month(s)  The format for your next appointment:   In Person  Provider:   Lars Mage, MD

## 2020-09-26 NOTE — Telephone Encounter (Signed)
Per appt note pt already has appt scheduled with Dr Silvio Pate on 09/27/20 at 2:45. Sending note to Dr Silvio Pate and Larene Beach CMA.

## 2020-09-26 NOTE — Progress Notes (Signed)
Electrophysiology Office Follow up Visit Note:    Date:  09/26/2020   ID:  [name], DOB 1945-06-01, MRN [id number]  PCP:  Venia Carbon, MD  [facility] Cardiologist:  [name], MD  [facility] Electrophysiologist:  Vickie Epley, MD    Interval History:    Bobby Woods is a 75 y.o. male who presents for a follow up visit. They were last seen in clinic December 21, 2019 after pacemaker was implanted September 16, 2019.  Today he presents for a 1 year follow-up after device implant.  He is done well since I last saw the patient.   Past Medical History:  Diagnosis Date   Arthritis    Blood transfusion without reported diagnosis    age 81 year    BPH (benign prostatic hypertrophy)    Chronic kidney disease    GERD (gastroesophageal reflux disease)    Hypertension    on medicines     Past Surgical History:  Procedure Laterality Date   COLONOSCOPY  2006   [place]   PACEMAKER IMPLANT N/A 09/16/2019   Procedure: PACEMAKER IMPLANT;  Surgeon: Vickie Epley, MD;  Location: [place] [facility];  Service: Cardiovascular;  Laterality: N/A;   PROSTATE BIOPSY  2011   negative   RETINAL DETACHMENT SURGERY  1985   left, right 05/07   ROTATOR CUFF REPAIR  10/09   right   TONSILLECTOMY  1972    Current Medications: Current Meds  Medication Sig   amLODipine (NORVASC) 5 MG tablet Take 1 tablet (5 mg total) by mouth daily.   finasteride (PROSCAR) 5 MG tablet TAKE 1 TABLET BY MOUTH EVERY DAY   meloxicam (MOBIC) 15 MG tablet Take 1 tablet (15 mg total) by mouth daily as needed for pain.   metoprolol succinate (TOPROL-XL) 25 MG 24 hr tablet Take 0.5 tablets (12.5 mg total) by mouth daily.   pantoprazole (PROTONIX) 40 MG tablet Take 1 tablet (40 mg total) by mouth daily.   tamsulosin (FLOMAX) 0.4 MG CAPS capsule Take 1 capsule (0.4 mg total) by mouth daily after supper.     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    Employer: [employer]   Occupation: Quality assurance--cleaning    Employer: [employer]    Comment:     Occupation: Custodial/funeral assistance    Comment: Financial controller  Tobacco Use   Smoking status: Never   Smokeless tobacco: Never  Vaping Use   Vaping Use: Never used  Substance and Sexual Activity   Alcohol use: No   Drug use: No   Sexual activity: Not on file  Other Topics Concern   Not on file  Social History Narrative   Has living will   Wife has [place] health care POA and alternate is Paramedic (cousin)   Would accept attempts at resuscitation but no prolonged artificial ventilation   Not sure about a feeding tube   Social Determinants of Health   Financial Resource Strain: Not on file  Food Insecurity: Not on file  Transportation Needs: Not on file  Physical Activity: Not on  file  Stress: Not on file  Social Connections: Not on file     Family History: The patient's family history includes Cancer in his father and paternal uncle. There is no history of Colon cancer, Rectal cancer, or Stomach cancer.  ROS:   Please see the history of present illness.    All other systems reviewed and are negative.  EKGs/Labs/Other Studies Reviewed:    The following studies were reviewed today:  September 26, 2020 device interrogation personally reviewed Battery longevity 12.5 years Lead parameter stable Atrially pacing 80.4% 10 episodes labeled.  Tachycardia consistent with an atrial tachycardia.  There are clear warm-up phases preceding each sustained tachycardia..  EKG:  The ekg ordered today demonstrates a paced, V sensed rhythm  Recent Labs: 07/27/2020: ALT 20; BUN 17;  Creatinine, Ser 1.40; Hemoglobin 15.3; Platelets 179.0; Potassium 3.9; Sodium 139  Recent Lipid Panel    Component Value Date/Time   CHOL 110 07/27/2020 0925   TRIG 77.0 07/27/2020 0925   HDL 42.30 07/27/2020 0925   CHOLHDL 3 07/27/2020 0925   VLDL 15.4 07/27/2020 0925   LDLCALC 52 07/27/2020 0925    Physical Exam:    VS:  BP (!) 112/92 (BP Location: Left Arm, Patient Position: Sitting, Cuff Size: Normal)   Pulse 68   Ht 5' 3.5" (1.613 m)   Wt 179 lb (81.2 kg)   SpO2 97%   BMI 31.21 kg/m     Wt Readings from Last 3 Encounters:  09/26/20 179 lb (81.2 kg)  07/27/20 184 lb (83.5 kg)  07/03/20 186 lb (84.4 kg)     GEN:  Well nourished, well developed in no acute distress HEENT: Normal NECK: No JVD; No carotid bruits LYMPHATICS: No lymphadenopathy CARDIAC: RRR, no murmurs, rubs, gallops.  Pacemaker pocket well-healed RESPIRATORY:  Clear to auscultation without rales, wheezing or rhonchi  ABDOMEN: Soft, non-tender, non-distended MUSCULOSKELETAL:  No edema; No deformity  SKIN: Warm and dry NEUROLOGIC:  Alert and oriented x 3 PSYCHIATRIC:  Normal affect   ASSESSMENT:    1. Tachycardia-bradycardia syndrome ([place])   2. Pacemaker   3. Primary hypertension    PLAN:    In order of problems listed above:  1. Tachycardia-bradycardia syndrome ([place]) 2. Pacemaker Pacemaker functioning well on today's interrogation.  Continue remote monitoring.  Continue metoprolol 12.5 mg by mouth daily.  3.  Hypertension Controlled.  Continue current regimen of amlodipine 5 mg by mouth daily and metoprolol.   Follow-up 1 year or sooner as needed.   Medication Adjustments/Labs and Tests Ordered: Current medicines are reviewed at length with the patient today.  Concerns regarding medicines are outlined above.  Orders Placed This Encounter  Procedures   EKG 12-Lead   No orders of the defined types were placed in this encounter.    Signed, Lars Mage, MD, [facility], [facility] 09/26/2020  2:34 PM    Electrophysiology [facility]

## 2020-09-27 ENCOUNTER — Other Ambulatory Visit: Payer: Self-pay | Admitting: Internal Medicine

## 2020-09-27 ENCOUNTER — Encounter: Payer: Self-pay | Admitting: Internal Medicine

## 2020-09-27 ENCOUNTER — Ambulatory Visit: Payer: Medicare PPO | Admitting: Internal Medicine

## 2020-09-27 ENCOUNTER — Other Ambulatory Visit: Payer: Self-pay

## 2020-09-27 VITALS — BP 110/70 | HR 66 | Temp 97.4°F | Ht 63.5 in | Wt 182.0 lb

## 2020-09-27 DIAGNOSIS — Z23 Encounter for immunization: Secondary | ICD-10-CM | POA: Diagnosis not present

## 2020-09-27 DIAGNOSIS — N1831 Chronic kidney disease, stage 3a: Secondary | ICD-10-CM

## 2020-09-27 DIAGNOSIS — R350 Frequency of micturition: Secondary | ICD-10-CM

## 2020-09-27 LAB — RENAL FUNCTION PANEL
Albumin: 4.1 g/dL (ref 3.5–5.2)
BUN: 20 mg/dL (ref 6–23)
CO2: 24 mEq/L (ref 19–32)
Calcium: 9.1 mg/dL (ref 8.4–10.5)
Chloride: 104 mEq/L (ref 96–112)
Creatinine, Ser: 1.49 mg/dL (ref 0.40–1.50)
GFR: 45.82 mL/min — ABNORMAL LOW (ref >60.00)
Glucose, Bld: 115 mg/dL — ABNORMAL HIGH (ref 70–99)
Phosphorus: 3.6 mg/dL (ref 2.3–4.6)
Potassium: 4.1 mEq/L (ref 3.5–5.1)
Sodium: 138 mEq/L (ref 135–145)

## 2020-09-27 LAB — POC URINALSYSI DIPSTICK (AUTOMATED)
Blood, UA: NEGATIVE
Glucose, UA: NEGATIVE
Ketones, UA: NEGATIVE
Leukocytes, UA: NEGATIVE
Nitrite, UA: NEGATIVE
Protein, UA: POSITIVE — AB
Spec Grav, UA: 1.025 (ref 1.010–1.025)
Urobilinogen, UA: 0.2 E.U./dL
pH, UA: 6 (ref 5.0–8.0)

## 2020-09-27 MED ORDER — TAMSULOSIN HCL 0.4 MG PO CAPS
0.8000 mg | ORAL_CAPSULE | Freq: Every day | ORAL | 3 refills | Status: DC
Start: 2020-09-27 — End: 2021-11-12

## 2020-09-27 NOTE — Addendum Note (Signed)
Addended by: Pilar Grammes on: 09/27/2020 03:44 PM   Modules accepted: Orders

## 2020-09-27 NOTE — Assessment & Plan Note (Signed)
Urine 1.025 SG--and otherwise negative No signs of infection---bladder or prostate I am concerned about urinary retention and overflow phenomenon  Discussed ER evaluation if unable to void or gets sick Will double the tamsulosin for now Urology ASAP

## 2020-09-27 NOTE — Addendum Note (Signed)
Addended by: Pilar Grammes on: 09/27/2020 03:36 PM   Modules accepted: Orders

## 2020-09-27 NOTE — Progress Notes (Signed)
Subjective:    Patient ID: Bobby Woods, male    DOB: 08-25-1945, 75 y.o.   MRN: [id number]  HPI Here due to urinary frequency This visit occurred during the SARS-CoV-2 public health emergency.  Safety protocols were in place, including screening questions prior to the visit, additional usage of staff PPE, and extensive cleaning of exam room while observing appropriate contact time as indicated for disinfecting solutions.   About 3-4 days ago---went to work at [employer] made it to bathroom after just driving from [place] to [facility] Then going multiple times during shift This has continued day and night--though some better today  No dysuria, hematuria Some RLQ tenderness--felt a little lump Continues on his meds  Current Outpatient Medications on File Prior to Visit  Medication Sig Dispense Refill   amLODipine (NORVASC) 5 MG tablet Take 1 tablet (5 mg total) by mouth daily. 30 tablet 3   finasteride (PROSCAR) 5 MG tablet TAKE 1 TABLET BY MOUTH EVERY DAY 90 tablet 3   meloxicam (MOBIC) 15 MG tablet Take 1 tablet (15 mg total) by mouth daily as needed for pain. 30 tablet 1   metoprolol succinate (TOPROL-XL) 25 MG 24 hr tablet Take 0.5 tablets (12.5 mg total) by mouth daily. 90 tablet 3   pantoprazole (PROTONIX) 40 MG tablet TAKE 1 TABLET BY MOUTH EVERY DAY 90 tablet 3   No current facility-administered medications on file prior to visit.    Allergies  Allergen Reactions   Zithromax [Azithromycin Dihydrate]     GI upset    Past Medical History:  Diagnosis Date   Arthritis    Blood transfusion without reported diagnosis    age 12 year    BPH (benign prostatic hypertrophy)    Chronic kidney disease    GERD (gastroesophageal reflux disease)    Hypertension    on medicines     Past Surgical History:  Procedure Laterality Date   COLONOSCOPY  2006   [place]   PACEMAKER IMPLANT N/A 09/16/2019   Procedure: PACEMAKER IMPLANT;   Surgeon: Vickie Epley, MD;  Location: [place] [facility];  Service: Cardiovascular;  Laterality: N/A;   PROSTATE BIOPSY  2011   negative   RETINAL DETACHMENT SURGERY  1985   left, right 05/07   ROTATOR CUFF REPAIR  10/09   right   TONSILLECTOMY  1972    Family History  Problem Relation Age of Onset   Cancer Father        prostate   Cancer Paternal Uncle        prostate cancer   Colon cancer Neg Hx    Rectal cancer Neg Hx    Stomach cancer Neg Hx     Social History   Socioeconomic History   Marital status: Married    Spouse name: Not on file   Number of children: 3   Years of education: Not on file   Highest education level: Not on file  Occupational History   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    Employer: [employer]   Occupation: Quality assurance--cleaning    Employer: [employer]    Comment:     Occupation: Custodial/funeral assistance    Comment: [employer]  Tobacco Use   Smoking status: Never   Smokeless tobacco: Never  Vaping Use   Vaping Use: Never used  Substance and Sexual Activity   Alcohol use: No  Drug use: No   Sexual activity: Not on file  Other Topics Concern   Not on file  Social History Narrative   Has living will   Wife has [place] health care POA and alternate is [name] (cousin)   Would accept attempts at resuscitation but no prolonged artificial ventilation   Not sure about a feeding tube   Social Determinants of Health   Financial Resource Strain: Not on file  Food Insecurity: Not on file  Transportation Needs: Not on file  Physical Activity: Not on file  Stress: Not on file  Social Connections: Not on file  Intimate Partner Violence: Not on file   Review of Systems No fever Bowels moving --not constipated     Objective:   Physical Exam Constitutional:      Appearance: Normal appearance.  Abdominal:     Palpations: Abdomen is soft.     Tenderness: There is no right CVA tenderness or left CVA  tenderness.     Comments: Seems to have sig suprapubic dullness. +/- slight tenderness   Genitourinary:    Comments: Diffusely enlarged prostate but not clearly tender Neurological:     Mental Status: He is alert.           Assessment & Plan:

## 2020-09-27 NOTE — Addendum Note (Signed)
Addended by: Leeanne Rio on: 09/27/2020 03:17 PM   Modules accepted: Orders

## 2020-09-27 NOTE — Patient Instructions (Signed)
Please double the tamsulosin and take 2 daily. I will try to get you in with the urologist as soon as possible

## 2020-09-28 ENCOUNTER — Other Ambulatory Visit: Payer: Medicare PPO

## 2020-10-08 ENCOUNTER — Encounter: Payer: Self-pay | Admitting: Internal Medicine

## 2020-10-08 ENCOUNTER — Ambulatory Visit (INDEPENDENT_AMBULATORY_CARE_PROVIDER_SITE_OTHER): Payer: Medicare PPO | Admitting: Internal Medicine

## 2020-10-08 VITALS — BP 134/74 | HR 70 | Ht 63.5 in | Wt 182.5 lb

## 2020-10-08 DIAGNOSIS — Z86010 Personal history of colonic polyps: Secondary | ICD-10-CM

## 2020-10-08 DIAGNOSIS — Z7902 Long term (current) use of antithrombotics/antiplatelets: Secondary | ICD-10-CM | POA: Diagnosis not present

## 2020-10-08 NOTE — Patient Instructions (Signed)
If you are age 75 or older, your body mass index should be between 23-30. Your Body mass index is 31.82 kg/m. If this is out of the aforementioned range listed, please consider follow up with your Primary Care Provider. __________________________________________________________  The [facility] providers would like to encourage you to use [facility] to communicate with providers for non-urgent requests or questions.  Due to long hold times on the telephone, sending your provider a message by [facility] may be a faster and more efficient way to get a response.  Please allow 48 business hours for a response.  Please remember that this is for non-urgent requests.    Follow up as needed.  Thank you for entrusting me with your care and choosing [facility].  Dr Henrene Pastor

## 2020-10-08 NOTE — Progress Notes (Signed)
HISTORY OF PRESENT ILLNESS:  Bobby Woods is a 75 y.o. male with hypertension, chronic renal insufficiency, and prior stroke x 2 last year for which he is on chronic Plavix therapy (followed by neurology, Dr. Melrose Woods at [place] [facility]).  Patient presents today regarding possible surveillance colonoscopy.  He underwent previous colonoscopy in 2002 and again in 2006.  These were both negative for neoplasia.  His most recent colonoscopy in June 2016 revealed 1 diminutive adenoma.  The exam was otherwise normal.  Patient tells me that his GI review of systems is entirely negative.  He does take pantoprazole for chronic GERD.  This controls symptoms well.  Review of blood work from July 2022 shows normal CBC with hemoglobin 15.5.  Abdominal ultrasound in 2020 showed cholelithiasis and hepatic steatosis.  CT scan at that same time was negative for acute abnormalities.  He is status post pacemaker placement.  REVIEW OF SYSTEMS:  All non-GI ROS negative unless otherwise stated in the HPI except for arthritis, back pain, visual change, cough, fatigue, muscle cramps, heart rhythm change, hearing problems, excessive thirst, excessive urination, urinary frequency, urinary leakage  Past Medical History:  Diagnosis Date   Arthritis    Blood transfusion without reported diagnosis    age 51 year    BPH (benign prostatic hypertrophy)    Chronic kidney disease    GERD (gastroesophageal reflux disease)    Hypertension    on medicines     Past Surgical History:  Procedure Laterality Date   COLONOSCOPY  2006   Bobby Woods   PACEMAKER IMPLANT N/A 09/16/2019   Procedure: PACEMAKER IMPLANT;  Surgeon: Bobby Epley, MD;  Location: [place] [facility];  Service: Cardiovascular;  Laterality: N/A;   PROSTATE BIOPSY  2011   negative   RETINAL DETACHMENT SURGERY  1985   left, right 05/07   ROTATOR CUFF REPAIR  10/09   right   TONSILLECTOMY  1972    Social History Bobby Woods  reports that he has never smoked. He has never used smokeless tobacco. He reports that he does not drink alcohol and does not use drugs.  family history includes Cancer in his father and paternal uncle.  Allergies  Allergen Reactions   Zithromax [Azithromycin Dihydrate]     GI upset       PHYSICAL EXAMINATION: Vital signs: BP 134/74   Pulse 70   Ht 5' 3.5" (1.613 m)   Wt 182 lb 8 oz (82.8 kg)   BMI 31.82 kg/m   Constitutional: generally well-appearing, no acute distress Psychiatric: alert and oriented x3, cooperative Eyes: extraocular movements intact, anicteric, conjunctiva pink Mouth: oral pharynx moist, no lesions Neck: supple no lymphadenopathy Cardiovascular: heart regular rate and rhythm, no murmur Lungs: clear to auscultation bilaterally Abdomen: soft, nontender, nondistended, no obvious ascites, no peritoneal signs, normal bowel sounds, no organomegaly Rectal: Omitted Extremities: no clubbing, cyanosis, or lower extremity edema bilaterally Skin: no lesions on visible extremities Neuro: No focal deficits.  Cranial nerves intact  ASSESSMENT:  1.  Colonoscopy 2016 with diminutive adenoma.  Otherwise normal.  Colonoscopies prior to that negative for neoplasia. 2.  Negative GI review of systems.  Normal hemoglobin 3.  Multiple comorbidities with significant change in overall health status since last examination.  Now with recent CVAs over the past year for which he is on Plavix.  Status post pacemaker placement   PLAN:  1.  We discussed the pros and cons  of surveillance colonoscopy given his overall health, age, and revision of surveillance guidelines.  As such, we both mutually agreed to forego future surveillance colonoscopy.  He could be reassessed as needed for any relevant clinical issues.  He understands and agrees.Marland Kitchen  He will resume general care with his PCP

## 2020-10-09 ENCOUNTER — Ambulatory Visit: Payer: Medicare PPO | Admitting: Cardiology

## 2020-10-14 NOTE — Progress Notes (Signed)
10/15/2020 3:35 PM   Bobby Woods [birth year] [id number]  Referring provider: Venia Carbon, MD [address] [place],   [address]  Chief Complaint  Patient presents with   Urinary Frequency    HPI: Bobby Woods is a 75 y.o. male referred for evaluation of urinary frequency.  Seen by Dr. 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 and right back pain has worsened No bowel symptoms, lower extremity weakness or paresthesias   PMH: Past Medical History:  Diagnosis Date   Arthritis    Blood transfusion without reported diagnosis    age 75 year    BPH (benign prostatic hypertrophy)    Chronic kidney disease    GERD (gastroesophageal reflux disease)    Hypertension    on medicines     Surgical History: Past Surgical History:  Procedure Laterality Date   COLONOSCOPY  2006   [place]   PACEMAKER IMPLANT N/A 09/16/2019   Procedure: PACEMAKER IMPLANT;  Surgeon: Vickie Epley, MD;  Location: [place] [facility];  Service: Cardiovascular;  Laterality: N/A;   PROSTATE BIOPSY  2011   negative   RETINAL DETACHMENT SURGERY  1985   left, right 05/07   ROTATOR CUFF REPAIR  10/09   right   TONSILLECTOMY  1972    Home Medications:  Allergies as of 10/15/2020       Reactions   Zithromax [azithromycin Dihydrate]    GI upset        Medication List        Accurate as of October 15, 2020  3:35 PM. If you have any questions, ask your nurse or doctor.          amLODipine 5 MG tablet Commonly known as: NORVASC Take 1 tablet (5 mg total) by mouth  daily.   finasteride 5 MG tablet Commonly known as: PROSCAR TAKE 1 TABLET BY MOUTH EVERY DAY   meloxicam 15 MG tablet Commonly known as: MOBIC Take 1 tablet (15 mg total) by mouth daily as needed for pain.   metoprolol succinate 25 MG 24 hr tablet Commonly known as: TOPROL-XL Take 0.5 tablets (12.5 mg total) by mouth daily.   mirabegron ER 25 MG Tb24 tablet Commonly known as: MYRBETRIQ Take 1 tablet (25 mg total) by mouth daily. Started by: Abbie Sons, MD   pantoprazole 40 MG tablet Commonly known as: PROTONIX TAKE 1 TABLET BY MOUTH EVERY DAY   PLAVIX PO Take by mouth.   tamsulosin 0.4 MG Caps capsule Commonly known as: FLOMAX Take 2 capsules (0.8 mg total) by mouth daily after supper.        Allergies:  Allergies  Allergen Reactions   Zithromax [Azithromycin Dihydrate]     GI upset    Family History: Family History  Problem Relation Age of Onset   Cancer Father        prostate   Cancer Paternal Uncle        prostate cancer   Colon cancer Neg Hx    Rectal cancer Neg Hx    Stomach cancer Neg Hx     Social History:  reports  that he has never smoked. He has never used smokeless tobacco. He reports that he does not drink alcohol and does not use drugs.   Physical Exam: BP 132/87   Pulse 78   Ht 5\' 3"  (1.6 m)   Wt 180 lb (81.6 kg)   BMI 31.89 kg/m   Constitutional:  Alert and oriented, No acute distress. HEENT: [place] AT, moist mucus membranes.  Trachea midline, no masses. Cardiovascular: No clubbing, cyanosis, or edema. Respiratory: Normal respiratory effort, no increased work of breathing. GI: Abdomen is soft, nontender, nondistended, no abdominal masses GU: Prostate 45 g, smooth without nodules Psychiatric: Normal mood and affect.  Laboratory Data:  Urinalysis 10/15/2020: Dipstick/microscopy negative   Assessment & Plan:    1.  Lower urinary tract symptoms Long history of BPH with LUTS We discussed potential causes including worsening of  his BPH.  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

## 2020-10-15 ENCOUNTER — Other Ambulatory Visit: Payer: Self-pay

## 2020-10-15 ENCOUNTER — Ambulatory Visit (INDEPENDENT_AMBULATORY_CARE_PROVIDER_SITE_OTHER): Payer: Medicare PPO | Admitting: Urology

## 2020-10-15 ENCOUNTER — Encounter: Payer: Self-pay | Admitting: Urology

## 2020-10-15 VITALS — BP 132/87 | HR 78 | Ht 63.0 in | Wt 180.0 lb

## 2020-10-15 DIAGNOSIS — N3941 Urge incontinence: Secondary | ICD-10-CM | POA: Diagnosis not present

## 2020-10-15 DIAGNOSIS — N401 Enlarged prostate with lower urinary tract symptoms: Secondary | ICD-10-CM | POA: Diagnosis not present

## 2020-10-15 DIAGNOSIS — R350 Frequency of micturition: Secondary | ICD-10-CM | POA: Diagnosis not present

## 2020-10-15 DIAGNOSIS — R3915 Urgency of urination: Secondary | ICD-10-CM | POA: Diagnosis not present

## 2020-10-15 MED ORDER — MIRABEGRON ER 25 MG PO TB24: 25.0000 mg | ORAL_TABLET | Freq: Every day | ORAL | 0 refills | Status: DC

## 2020-10-16 LAB — URINALYSIS, COMPLETE
Bilirubin, UA: NEGATIVE
Glucose, UA: NEGATIVE
Ketones, UA: NEGATIVE
Leukocytes,UA: NEGATIVE
Nitrite, UA: NEGATIVE
Protein,UA: NEGATIVE
RBC, UA: NEGATIVE
Specific Gravity, UA: 1.01 (ref 1.005–1.030)
Urobilinogen, Ur: 0.2 mg/dL (ref 0.2–1.0)
pH, UA: 5.5 (ref 5.0–7.5)

## 2020-10-16 LAB — MICROSCOPIC EXAMINATION
Bacteria, UA: NONE SEEN
Epithelial Cells (non renal): NONE SEEN /hpf (ref 0–10)
RBC, Urine: NONE SEEN /hpf (ref 0–2)
WBC, UA: NONE SEEN /hpf (ref 0–5)

## 2020-10-17 NOTE — Progress Notes (Signed)
Cardiology Office Note:    Date:  10/18/2020   ID:  Bobby Woods, DOB May 02, 1945, MRN [id number]  PCP:  Venia Carbon, MD  [facility] Cardiologist:  [name], MD  [facility] Electrophysiologist:  Vickie Epley, MD   Referring MD: Venia Carbon, MD   Chief Complaint: 3 month follow-up  History of Present Illness:    Bobby Woods is a 75 y.o. 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  Echo 07/2019 showed normal systolic and diastolic function, EF 32-99%. Coronary CTA 08/2019 showed calcium score of 0 with no evidence of CAD.   Last seen 07/03/20 and reported fatigue and balance issues after old stroke. Amlodipine was stopped and Toprol was decreased to 12.5mg  daily.  Saw EP 09/26/20 and device was functioning well. No changes were made  Today,  he reports chest pain. Started 3-4 months. It's the same as before.Can happen sporadically and occurs occasionally. It is not worse with exertion. Feels like an ache. No associated SOB, nasuea, vomiting. BP today good. LDL 52 in 07/2020. Patient would like to pursue stress test.   Past Medical History:  Diagnosis Date   Arthritis    Blood transfusion without reported diagnosis    age 63 year    BPH (benign prostatic hypertrophy)    Chronic kidney disease    GERD (gastroesophageal reflux disease)    Hypertension    on medicines     Past Surgical History:  Procedure Laterality Date   COLONOSCOPY  2006   [place]   PACEMAKER IMPLANT N/A 09/16/2019   Procedure: PACEMAKER IMPLANT;  Surgeon: Vickie Epley, MD;  Location: [place] [facility];  Service: Cardiovascular;  Laterality: N/A;   PROSTATE BIOPSY  2011   negative   RETINAL DETACHMENT SURGERY  1985   left, right 05/07   ROTATOR CUFF REPAIR  10/09   right   TONSILLECTOMY  1972    Current Medications: Current Meds  Medication Sig   amLODipine  (NORVASC) 5 MG tablet Take 1 tablet (5 mg total) by mouth daily.   clopidogrel (PLAVIX) 75 MG tablet Take 1 tablet by mouth daily.   finasteride (PROSCAR) 5 MG tablet TAKE 1 TABLET BY MOUTH EVERY DAY   metoprolol succinate (TOPROL-XL) 25 MG 24 hr tablet Take 0.5 tablets (12.5 mg total) by mouth daily.   mirabegron ER (MYRBETRIQ) 25 MG TB24 tablet Take 1 tablet (25 mg total) by mouth daily.   pantoprazole (PROTONIX) 40 MG tablet TAKE 1 TABLET BY MOUTH EVERY DAY   tamsulosin (FLOMAX) 0.4 MG CAPS capsule Take 2 capsules (0.8 mg total) by mouth daily after supper.     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    Employer: [employer]   Occupation: Quality assurance--cleaning    Employer: [employer]    Comment:     Occupation: Custodial/funeral assistance    Comment: [employer]  Tobacco Use   Smoking status: Never   Smokeless tobacco: Never  Vaping Use   Vaping Use: Never used  Substance and Sexual Activity   Alcohol use: No   Drug use: No   Sexual activity: Not on file  Other Topics Concern   Not on file  Social History Narrative   Has living will   Wife has [place] health care POA and alternate is [name] (cousin)   Would accept attempts at resuscitation but no prolonged artificial ventilation   Not sure about a feeding tube   Social Determinants of Health   Financial Resource Strain: Not on file  Food Insecurity: Not on file  Transportation Needs: Not on file  Physical Activity: Not on file  Stress: Not on file  Social Connections: Not on file     Family History: The patient's family history includes Cancer in his father and paternal uncle. There is no history of Colon cancer, Rectal  cancer, or Stomach cancer.  ROS:   Please see the history of present illness.     All other systems reviewed and are negative.  EKGs/Labs/Other Studies Reviewed:    The following studies were reviewed today:  Coronary CT 08/2019   IMPRESSION: 1. Coronary calcium score of 0. Patient is low risk for near term coronary events   2. Normal coronary origin with right dominance.   3. No evidence of CAD.   4. CAD-RADS 0. Consider non-atherosclerotic causes of chest pain.   Electronically Signed: By: [name] M.D. On: 08/25/2019 16:36  Echo 07/2019 1. Left ventricular ejection fraction, by estimation, is 60 to 65%. The  left ventricle has normal function. The left ventricle has no regional  wall motion abnormalities. Left ventricular diastolic parameters were  normal.   2. Right ventricular systolic function is normal. The right ventricular  size is mildly enlarged. There is normal pulmonary artery systolic  pressure.   3. The mitral valve is normal in structure. No evidence of mitral valve  regurgitation.   4. The aortic valve is tricuspid. Aortic valve regurgitation is not  visualized.   5. The inferior vena cava is normal in size with greater than 50%  respiratory variability, suggesting right atrial pressure of 3 mmHg.   EKG:  EKG is  ordered today.  The ekg ordered today demonstrates NSR, 72bpm, nonspecific  Twave changes  Recent Labs: 07/27/2020: ALT 20; Hemoglobin 15.3; Platelets 179.0 09/27/2020: BUN 20; Creatinine, Ser 1.49; Potassium 4.1; Sodium 138  Recent Lipid Panel    Component Value Date/Time   CHOL 110 07/27/2020 0925   TRIG 77.0 07/27/2020 0925   HDL 42.30 07/27/2020 0925   CHOLHDL 3 07/27/2020 0925   VLDL 15.4 07/27/2020 0925   LDLCALC 52 07/27/2020 0925     Physical Exam:    VS:  BP 120/80 (BP Location: Left Arm, Patient Position: Sitting, Cuff Size: Normal)   Pulse 86   Ht 5\' 3"  (1.6 m)   Wt 183 lb (83 kg)   SpO2 96%   BMI 32.42 kg/m      Wt Readings from Last 3 Encounters:  10/18/20 183 lb (83 kg)  10/15/20 180 lb (81.6 kg)  10/08/20 182 lb 8 oz (82.8 kg)     GEN:  Well nourished, well developed in no acute distress HEENT: Normal NECK: No JVD; No carotid bruits LYMPHATICS: No lymphadenopathy CARDIAC: RRR, + murmur, rubs, gallops RESPIRATORY:  Clear to auscultation without rales, wheezing or rhonchi  ABDOMEN: Soft, non-tender, non-distended MUSCULOSKELETAL:  No edema; No deformity  SKIN: Warm and dry NEUROLOGIC:  Alert and oriented x 3 PSYCHIATRIC:  Normal affect  ASSESSMENT:    1. Tachycardia-bradycardia syndrome ([place])   2. Sinus node dysfunction (HCC)   3. Essential hypertension   4. Chest pain of uncertain etiology    PLAN:    In order of problems listed above:  Atypical Chest pain Patient reports 3-4 months of chest ache, not worse with exertion. Prior coronary CT 2021 showed no CAD with calcium score of 0. EKG today with no ischemic changes. Patient interested in pursuing a stress test. Will order Lexiscam Myoview.Continue Plavix, Toprol.    SSS s/p PPM 09/2019 Followed by EP. Most recent device check showed normal functioning device.   HTN BP good today. Continue Toprol 12.5mg  daily.   H/o stroke Continue plavix.   Disposition: Follow up in 2 months with MD/APP    Signed, Johnay Mano Ninfa Meeker, PA-C  10/18/2020 3:41 PM    [facility]

## 2020-10-18 ENCOUNTER — Ambulatory Visit: Payer: Medicare PPO | Admitting: Medical

## 2020-10-18 ENCOUNTER — Other Ambulatory Visit: Payer: Self-pay

## 2020-10-18 ENCOUNTER — Encounter: Payer: Self-pay | Admitting: Medical

## 2020-10-18 VITALS — BP 120/80 | HR 86 | Ht 63.0 in | Wt 183.0 lb

## 2020-10-18 DIAGNOSIS — I10 Essential (primary) hypertension: Secondary | ICD-10-CM

## 2020-10-18 DIAGNOSIS — R072 Precordial pain: Secondary | ICD-10-CM | POA: Diagnosis not present

## 2020-10-18 DIAGNOSIS — R079 Chest pain, unspecified: Secondary | ICD-10-CM

## 2020-10-18 DIAGNOSIS — I495 Sick sinus syndrome: Secondary | ICD-10-CM | POA: Diagnosis not present

## 2020-10-18 NOTE — Patient Instructions (Signed)
Medication Instructions:  - Your physician recommends that you continue on your current medications as directed. Please refer to the Current Medication list given to you today.  *If you need a refill on your cardiac medications before your next appointment, please call your pharmacy*   Lab Work: - none ordered  If you have labs (blood work) drawn today and your tests are completely normal, you will receive your results only by: [place] (if you have MyChart) OR A paper copy in the mail If you have any lab test that is abnormal or we need to change your treatment, we will call you to review the results.   Testing/Procedures: - Your physician has requested that you have a lexiscan myoview (chemical stress test).   [place]  Your caregiver has ordered a Stress Test with nuclear imaging. The purpose of this test is to evaluate the blood supply to your heart muscle. This procedure is referred to as a "Non-Invasive Stress Test." This is because other than having an IV started in your vein, nothing is inserted or "invades" your body. Cardiac stress tests are done to find areas of poor blood flow to the heart by determining the extent of coronary artery disease (CAD). Some patients exercise on a treadmill, which naturally increases the blood flow to your heart, while others who are  unable to walk on a treadmill due to physical limitations have a pharmacologic/chemical stress agent called Lexiscan . This medicine will mimic walking on a treadmill by temporarily increasing your coronary blood flow.   Please note: these test may take anywhere between 2-4 hours to complete  PLEASE REPORT TO [place] AT THE FIRST DESK WILL DIRECT YOU WHERE TO GO  Date of Procedure:_____________________________________  Arrival Time for Procedure:______________________________  Instructions regarding medication:    ___x_:  Hold Metoprolol the morning of your  test  ___x_:  You may take all of your other regular morning medications the day of your test with enough water to get them down safely  PLEASE NOTIFY THE OFFICE AT LEAST 24 HOURS IN ADVANCE IF YOU ARE UNABLE TO [place].  [phone] AND  PLEASE NOTIFY NUCLEAR MEDICINE AT [facility] AT LEAST 24 HOURS IN ADVANCE IF YOU ARE UNABLE TO KEEP YOUR APPOINTMENT. [phone]  How to prepare for your Myoview test:  Do not eat or drink after midnight No caffeine for 24 hours prior to test No smoking 24 hours prior to test. Your medication may be taken with water.  If your doctor stopped a medication because of this test, do not take that medication. Ladies, please do not wear dresses.  Skirts or pants are appropriate. Please wear a short sleeve shirt. No perfume, cologne or lotion. Wear comfortable walking shoes. No heels!   Follow-Up: At [facility], you and your health needs are our priority.  As part of our continuing mission to provide you with exceptional heart care, we have created designated Provider Care Teams.  These Care Teams include your primary Cardiologist (physician) and Advanced Practice Providers (APPs -  Physician Assistants and Nurse Practitioners) who all work together to provide you with the care you need, when you need it.  We recommend signing up for the patient portal called "MyChart".  Sign up information is provided on this After Visit Summary.  MyChart is used to connect with patients for Virtual Visits (Telemedicine).  Patients are able to view lab/test results, encounter notes, upcoming appointments, etc.  Non-urgent messages  can be sent to your provider as well.   To learn more about what you can do with MyChart, go to [email or web address].    Your next appointment:   2 month(s)  The format for your next appointment:   In Person  Provider:   You may see [name], MD or one of the following Advanced Practice Providers on your designated Care  Team:   Murray Hodgkins, NP Christell Faith, PA-C Marrianne Mood, PA-C Cadence Kathlen Mody, [place]   Other Instructions Cardiac Nuclear Scan A cardiac nuclear scan is a test that is done to check the flow of blood to your heart. It is done when you are resting and when you are exercising. The test looks for problems such as: Not enough blood reaching a portion of the heart. The heart muscle not working as it should. You may need this test if: You have heart disease. You have had lab results that are not normal. You have had heart surgery or a balloon procedure to open up blocked arteries (angioplasty). You have chest pain. You have shortness of breath. In this test, a special dye (tracer) is put into your bloodstream. The tracer will travel to your heart. A camera will then take pictures of your heart to see how the tracer moves through your heart. This test is usually done at a hospital and takes 2-4 hours. Tell a doctor about: Any allergies you have. All medicines you are taking, including vitamins, herbs, eye drops, creams, and over-the-counter medicines. Any problems you or family members have had with anesthetic medicines. Any blood disorders you have. Any surgeries you have had. Any medical conditions you have. Whether you are pregnant or may be pregnant. What are the risks? Generally, this is a safe test. However, problems may occur, such as: Serious chest pain and heart attack. This is only a risk if the stress portion of the test is done. Rapid heartbeat. A feeling of warmth in your chest. This feeling usually does not last long. Allergic reaction to the tracer. What happens before the test? Ask your doctor about changing or stopping your normal medicines. This is important. Follow instructions from your doctor about what you cannot eat or drink. Remove your jewelry on the day of the test. What happens during the test? An IV tube will be inserted into one of your veins. Your  doctor will give you a small amount of tracer through the IV tube. You will wait for 20-40 minutes while the tracer moves through your bloodstream. Your heart will be monitored with an electrocardiogram (ECG). You will lie down on an exam table. Pictures of your heart will be taken for about 15-20 minutes. You may also have a stress test. For this test, one of these things may be done: You will be asked to exercise on a treadmill or a stationary bike. You will be given medicines that will make your heart work harder. This is done if you are unable to exercise. When blood flow to your heart has peaked, a tracer will again be given through the IV tube. After 20-40 minutes, you will get back on the exam table. More pictures will be taken of your heart. Depending on the tracer that is used, more pictures may need to be taken 3-4 hours later. Your IV tube will be removed when the test is over. The test may vary among doctors and hospitals. What happens after the test? Ask your doctor: Whether you can return to your normal schedule,  including diet, activities, and medicines. Whether you should drink more fluids. This will help to remove the tracer from your body. Drink enough fluid to keep your pee (urine) pale yellow. Ask your doctor, or the department that is doing the test: When will my results be ready? How will I get my results? Summary A cardiac nuclear scan is a test that is done to check the flow of blood to your heart. Tell your doctor whether you are pregnant or may be pregnant. Before the test, ask your doctor about changing or stopping your normal medicines. This is important. Ask your doctor whether you can return to your normal activities. You may be asked to drink more fluids. This information is not intended to replace advice given to you by your health care provider. Make sure you discuss any questions you have with your health care provider. Document Revised: 04/21/2018 Document  Reviewed: 06/15/2017 Elsevier Patient Education  [place].

## 2020-10-29 ENCOUNTER — Other Ambulatory Visit: Payer: Self-pay

## 2020-10-29 ENCOUNTER — Ambulatory Visit
Admission: RE | Admit: 2020-10-29 | Discharge: 2020-10-29 | Disposition: A | Discharge status: Home / Self Care | Payer: Medicare PPO | Source: Ambulatory Visit | Attending: Medical | Admitting: Medical

## 2020-10-29 DIAGNOSIS — R072 Precordial pain: Secondary | ICD-10-CM | POA: Diagnosis not present

## 2020-10-29 MED ORDER — TECHNETIUM TC 99M TETROFOSMIN IV KIT
30.0000 | PACK | Freq: Once | INTRAVENOUS | Status: AC | PRN
  Administered 2020-10-29: 32.68 via INTRAVENOUS

## 2020-10-29 MED ORDER — TECHNETIUM TC 99M TETROFOSMIN IV KIT
10.0000 | PACK | Freq: Once | INTRAVENOUS | Status: AC
  Administered 2020-10-29: 10.98 via INTRAVENOUS

## 2020-10-29 MED ORDER — REGADENOSON 0.4 MG/5ML IV SOLN
0.4000 mg | Freq: Once | INTRAVENOUS | Status: AC
  Administered 2020-10-29: 0.4 mg via INTRAVENOUS
  Filled 2020-10-29: qty 5

## 2020-10-30 LAB — NM MYOCAR MULTI W/SPECT W/WALL MOTION / EF
Estimated workload: 1
Exercise duration (min): 0 min
Exercise duration (sec): 0 s
LV dias vol: 44 mL (ref 62–150)
LV sys vol: 8 mL
MPHR: 146 {beats}/min
Nuc Stress EF: 82 %
Peak HR: 85 {beats}/min
Percent HR: 58 %
Rest HR: 61 {beats}/min
Rest Nuclear Isotope Dose: 11 mCi
SDS: 2
SRS: 1
SSS: 0
ST Depression (mm): 0 mm
Stress Nuclear Isotope Dose: 32.7 mCi
TID: 0.91

## 2020-11-01 ENCOUNTER — Telehealth: Payer: Self-pay | Admitting: Physician Assistant

## 2020-11-01 NOTE — Telephone Encounter (Signed)
Patient would like stress test results.  

## 2020-11-05 NOTE — Telephone Encounter (Signed)
Valora Corporal, RN  11/01/2020  3:09 PM EDT     Reviewed results with patients wife per release form and confirmed next appointment. She verbalized understanding of our conversation with no further questions at this time.

## 2020-11-15 ENCOUNTER — Other Ambulatory Visit: Payer: Self-pay

## 2020-11-15 ENCOUNTER — Ambulatory Visit: Payer: Medicare PPO | Admitting: Physician Assistant

## 2020-11-15 ENCOUNTER — Encounter: Payer: Self-pay | Admitting: Physician Assistant

## 2020-11-15 VITALS — BP 127/83 | HR 74 | Ht 63.0 in | Wt 177.0 lb

## 2020-11-15 DIAGNOSIS — R350 Frequency of micturition: Secondary | ICD-10-CM | POA: Diagnosis not present

## 2020-11-15 LAB — BLADDER SCAN AMB NON-IMAGING

## 2020-11-15 MED ORDER — MIRABEGRON ER 25 MG PO TB24: 25.0000 mg | ORAL_TABLET | Freq: Every day | ORAL | 11 refills | Status: DC

## 2020-11-15 NOTE — Progress Notes (Signed)
11/15/2020 2:01 PM   Bobby Woods [birth year] [id number]  CC: Chief Complaint  Patient presents with   Urinary Frequency   HPI: Bobby Woods is a 75 y.o. male with PMH BPH on Flomax and finasteride, elevated PSA with benign biopsy in 2011, lumbar stenosis, and OAB wet with nocturia who presents today for symptom recheck on Myrbetriq 25 mg daily.   Today he reports significant symptomatic improvement on Myrbetriq.  He states he is having nocturia x1-2 and some occasional postvoid dribbling, but overall is very pleased with his improvement in frequency.  PVR 40mL.  PMH: Past Medical History:  Diagnosis Date   Arthritis    Blood transfusion without reported diagnosis    age 49 year    BPH (benign prostatic hypertrophy)    Chronic kidney disease    GERD (gastroesophageal reflux disease)    Hypertension    on medicines     Surgical History: Past Surgical History:  Procedure Laterality Date   COLONOSCOPY  2006   [place]   PACEMAKER IMPLANT N/A 09/16/2019   Procedure: PACEMAKER IMPLANT;  Surgeon: Vickie Epley, MD;  Location: [place] [facility];  Service: Cardiovascular;  Laterality: N/A;   PROSTATE BIOPSY  2011   negative   RETINAL DETACHMENT SURGERY  1985   left, right 05/07   ROTATOR CUFF REPAIR  10/09   right   TONSILLECTOMY  1972    Home Medications:  Allergies as of 11/15/2020       Reactions   Zithromax [azithromycin Dihydrate]    GI upset        Medication List        Accurate as of November 15, 2020  2:01 PM. If you have any questions, ask your nurse or doctor.          amLODipine 5 MG tablet Commonly known as: NORVASC Take 1 tablet (5 mg total) by mouth daily.   clopidogrel 75 MG tablet Commonly known as: PLAVIX Take 1 tablet by mouth daily.   finasteride 5 MG tablet Commonly known as: PROSCAR TAKE 1 TABLET BY MOUTH EVERY DAY   meloxicam 15 MG tablet Commonly known as: MOBIC Take 1 tablet  (15 mg total) by mouth daily as needed for pain.   metoprolol succinate 25 MG 24 hr tablet Commonly known as: TOPROL-XL Take 0.5 tablets (12.5 mg total) by mouth daily.   mirabegron ER 25 MG Tb24 tablet Commonly known as: MYRBETRIQ Take 1 tablet (25 mg total) by mouth daily.   pantoprazole 40 MG tablet Commonly known as: PROTONIX TAKE 1 TABLET BY MOUTH EVERY DAY   tamsulosin 0.4 MG Caps capsule Commonly known as: FLOMAX Take 2 capsules (0.8 mg total) by mouth daily after supper.        Allergies:  Allergies  Allergen Reactions   Zithromax [Azithromycin Dihydrate]     GI upset    Family History: Family History  Problem Relation Age of Onset   Cancer Father        prostate   Cancer Paternal Uncle        prostate cancer   Colon cancer Neg Hx    Rectal cancer Neg Hx    Stomach cancer Neg Hx     Social History:   reports that he has never smoked. He has never used smokeless tobacco. He reports that he does not drink alcohol and does not use drugs.  Physical Exam: BP 127/83  Pulse 74   Ht 5\' 3"  (1.6 m)   Wt 177 lb (80.3 kg)   BMI 31.35 kg/m   Constitutional:  Alert and oriented, no acute distress, nontoxic appearing HEENT: [place], AT Cardiovascular: No clubbing, cyanosis, or edema Respiratory: Normal respiratory effort, no increased work of breathing Skin: No rashes, bruises or suspicious lesions Neurologic: Grossly intact, no focal deficits, moving all 4 extremities Psychiatric: Normal mood and affect  Laboratory Data: Results for orders placed or performed in visit on 11/15/20  Bladder Scan (Post Void Residual) in office  Result Value Ref Range   Scan Result 96mL    Assessment & Plan:   1. Urinary frequency Significant improvement in frequency and nocturia on Myrbetriq 25 mg daily.  We will plan to continue this.  He is emptying appropriately.  We will plan for annual follow-up moving forward. - Bladder Scan (Post Void Residual) in office - mirabegron ER  (MYRBETRIQ) 25 MG TB24 tablet; Take 1 tablet (25 mg total) by mouth daily.  Dispense: 30 tablet; Refill: 11   Return in about 1 year (around 11/15/2021) for Annual DRE/IPSS/PVR.  Debroah Loop, PA-C  [facility] [address] [place], [place] [address] [phone]

## 2020-11-29 ENCOUNTER — Ambulatory Visit: Payer: Medicare PPO | Admitting: Family

## 2020-11-29 ENCOUNTER — Other Ambulatory Visit: Payer: Self-pay

## 2020-11-29 ENCOUNTER — Telehealth: Payer: Self-pay

## 2020-11-29 ENCOUNTER — Encounter: Payer: Self-pay | Admitting: Family

## 2020-11-29 VITALS — BP 127/87 | Temp 98.0°F | Ht 63.0 in | Wt 181.0 lb

## 2020-11-29 DIAGNOSIS — H8112 Benign paroxysmal vertigo, left ear: Secondary | ICD-10-CM | POA: Insufficient documentation

## 2020-11-29 DIAGNOSIS — R42 Dizziness and giddiness: Secondary | ICD-10-CM | POA: Diagnosis not present

## 2020-11-29 MED ORDER — MECLIZINE HCL 25 MG PO TABS: 25.0000 mg | ORAL_TABLET | Freq: Two times a day (BID) | ORAL | 0 refills | Status: AC | PRN

## 2020-11-29 NOTE — Assessment & Plan Note (Signed)
rx meclizine given to pt. Advised ok to take twice daily prn vertigo, however may/will cause drowsiness so pt informed not to drive machinery while taking and due to vertigo pt advised to rise slowly from seated to standing. If symptoms do not improve over the next 3-4 days will consider epley maneuver referral.

## 2020-11-29 NOTE — Patient Instructions (Addendum)
Take meclizine as prescribed. Sent to preferred pharmacy. Rise slowly when changing positions. If symptoms do not improve in the next 3-4 days may consider referral for epley maneuver.

## 2020-11-29 NOTE — Assessment & Plan Note (Signed)
Rise slowly upon rising. Medication prescribed, take as directed.

## 2020-11-29 NOTE — Telephone Encounter (Signed)
Pt has an appt scheduled with Tabitha today at 2:40pm   [place] [facility] Patient Name: Bobby Woods Gender: Male DOB: [birth year] Age: 75 Y 24 D Return Phone Number: [phone] (Primary), [phone] (Secondary) Address: City/ State/ Zip: [place] [address] Client [facility] [place] [facility] - Client Client Site [place] - Day Provider Viviana Simpler- MD Contact Type Call Who Is Calling Patient / Member / Family / Caregiver Call Type Triage / Clinical Relationship To Patient Self Return Phone Number [phone] (Primary) Chief Complaint Dizziness Reason for Call Symptomatic / Request for [place] states when he bends over or squats he gets dizzy. Had two strokes last year. He used to take medication for vertigo but stopped taking it in January. Translation No Nurse Assessment Nurse: Radford Pax, RN, Eugene Garnet Date/Time Eilene Ghazi Time): 11/29/2020 9:47:44 AM Confirm and document reason for call. If symptomatic, describe symptoms. ---Has been having dizziness with bending over and squatting sown, moving head quickly. Does the patient have any new or worsening symptoms? ---Yes Will a triage be completed? ---Yes Related visit to physician within the last 2 weeks? ---No Does the PT have any chronic conditions? (i.e. diabetes, asthma, this includes High risk factors for pregnancy, etc.) ---Yes List chronic conditions. ---hx stroke x2 HTN blood thinner Is this a behavioral health or substance abuse call? ---No Guidelines Guideline Title Affirmed Question Affirmed Notes Nurse Date/Time (Eastern Time) Dizziness - Vertigo [1] MODERATE dizziness (e.g., vertigo; feels very unsteady, interferes with normal activities) AND [2] has NOT been evaluated by physician for this Radford Pax, RN, [place] 11/29/2020 9:49:42 AM PLEASE NOTE: All  timestamps contained within this report are represented as [place] Standard Time. CONFIDENTIALTY NOTICE: This fax transmission is intended only for the addressee. It contains information that is legally privileged, confidential or otherwise protected from use or disclosure. If you are not the intended recipient, you are strictly prohibited from reviewing, disclosing, copying using or disseminating any of this information or taking any action in reliance on or regarding this information. If you have received this fax in error, please notify us immediately by telephone so that we can arrange for its return to [place]. Phone: [phone], Toll-Free: [phone], Fax: [phone] Page: 2 of 2 Call Id: [id number] [place]. Time Eilene Ghazi Time) Disposition Final User 11/29/2020 9:54:17 AM See PCP within 24 Hours Yes Radford Pax, RN, Eugene Garnet Caller Disagree/Comply Comply Caller Understands Yes PreDisposition Call Doctor Care Advice Given Per Guideline SEE PCP WITHIN 24 HOURS: * IF OFFICE WILL BE OPEN: You need to be examined within the next 24 hours. Call your doctor (or NP/PA) when the office opens and make an appointment. CALL BACK IF: * Severe headache occurs * Weakness develops in an arm or leg * Unable to walk without falling * You become worse CARE ADVICE given per Dizziness - Vertigo (Adult) guideline. Referrals REFERRED TO PCP OFFICE

## 2020-11-29 NOTE — Progress Notes (Addendum)
Established Patient Office Visit  Subjective:  Patient ID: Bobby Woods, male    DOB: 29-Aug-1945  Age: 75 y.o. MRN: [id number]  CC:  Chief Complaint  Patient presents with   Dizziness    HPI [name] is here today with c/o four day h/o dizziness. Dizziness is not constant, just occurs with bending over and or turning his head from side to side. No sinus pressure, no ear pain, on congestion. No nausea. Able to walk a straight line. Last episode of vertigo was in January 2021.   Denies blurry vision, and or slurring of speech.   Has had episodes of vertigo in the past, and given meclizine which helped him some in the past. 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    Past Medical History:  Diagnosis Date   Arthritis    Blood transfusion without reported diagnosis    age 53 year    BPH (benign prostatic hypertrophy)    Chronic kidney disease    GERD (gastroesophageal reflux disease)    Hypertension    on medicines     Past Surgical History:  Procedure Laterality Date   COLONOSCOPY  2006   [place]   PACEMAKER IMPLANT N/A 09/16/2019   Procedure: PACEMAKER IMPLANT;  Surgeon: Vickie Epley, MD;  Location: [facility];  Service: Cardiovascular;  Laterality: N/A;   PROSTATE BIOPSY  2011   negative   RETINAL DETACHMENT SURGERY  1985   left, right 05/07   ROTATOR CUFF REPAIR  10/09   right   TONSILLECTOMY  1972    Family History  Problem Relation Age of Onset   Cancer Father        prostate   Cancer Paternal Uncle        prostate cancer   Colon cancer Neg Hx    Rectal cancer Neg Hx    Stomach cancer Neg Hx     Social History   Socioeconomic History   Marital status: Married    Spouse name: Not on file   Number of children: 3   Years of education: Not on file   Highest education level: Not on file  Occupational History   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     Employer: [employer]   Occupation: Quality assurance--cleaning    Employer: [employer]    Comment:     Occupation: Custodial/funeral assistance    Comment: Financial controller  Tobacco Use   Smoking status: Never   Smokeless tobacco: Never  Vaping Use   Vaping Use: Never used  Substance and Sexual Activity   Alcohol use: No   Drug use: No   Sexual activity: Not on file  Other Topics Concern   Not on file  Social History Narrative   Has living will   Wife has [place] health care POA and alternate is Paramedic (cousin)   Would accept attempts at resuscitation but no prolonged artificial ventilation   Not sure about a feeding tube   Social Determinants of Health   Financial Resource Strain: Not on file  Food Insecurity: Not on file  Transportation Needs: Not on file  Physical Activity: Not on file  Stress: Not on file  Social Connections: Not on file  Intimate Partner Violence: Not on file    Outpatient Medications Prior to Visit  Medication Sig Dispense Refill   clopidogrel (PLAVIX) 75  MG tablet Take 1 tablet by mouth daily.     finasteride (PROSCAR) 5 MG tablet TAKE 1 TABLET BY MOUTH EVERY DAY 90 tablet 3   metoprolol succinate (TOPROL-XL) 25 MG 24 hr tablet Take 0.5 tablets (12.5 mg total) by mouth daily. 90 tablet 3   mirabegron ER (MYRBETRIQ) 25 MG TB24 tablet Take 1 tablet (25 mg total) by mouth daily. 30 tablet 11   pantoprazole (PROTONIX) 40 MG tablet TAKE 1 TABLET BY MOUTH EVERY DAY 90 tablet 3   tamsulosin (FLOMAX) 0.4 MG CAPS capsule Take 2 capsules (0.8 mg total) by mouth daily after supper. 180 capsule 3   amLODipine (NORVASC) 5 MG tablet Take 1 tablet (5 mg total) by mouth daily. 30 tablet 3   meloxicam (MOBIC) 15 MG tablet Take 1 tablet (15 mg total) by mouth daily as needed for pain. (Patient not taking: Reported on 10/18/2020) 30 tablet 1   No facility-administered medications prior to visit.    Allergies  Allergen Reactions   Zithromax [Azithromycin  Dihydrate]     GI upset    ROS Review of Systems  Constitutional:  Negative for chills and fever.  HENT:  Negative for congestion, ear pain and sore throat.   Eyes:  Negative for photophobia and visual disturbance.  Respiratory:  Negative for shortness of breath and wheezing.   Cardiovascular:  Negative for chest pain.  Neurological:  Positive for dizziness (upon bending over and or sitting up quickly or moving head side to side). Negative for facial asymmetry, speech difficulty, weakness and headaches.     Objective:    Physical Exam Constitutional:      General: He is not in acute distress.    Appearance: Normal appearance. He is not ill-appearing.  HENT:     Head: Normocephalic.     Right Ear: A middle ear effusion is present. Tympanic membrane is not bulging.     Left Ear: Tympanic membrane normal.     Ears:      Nose: Nose normal.  Eyes:     Extraocular Movements: Extraocular movements intact.     Conjunctiva/sclera: Conjunctivae normal.     Pupils: Pupils are equal, round, and reactive to light.  Cardiovascular:     Rate and Rhythm: Normal rate and regular rhythm.  Pulmonary:     Effort: Pulmonary effort is normal.     Breath sounds: Normal breath sounds.  Neurological:     General: No focal deficit present.     Mental Status: He is alert. He is disoriented.     Cranial Nerves: No cranial nerve deficit.     Motor: No weakness.     Coordination: Coordination normal.     Gait: Gait normal.     Comments: Romberg negative. CN1-12 intact Dix hallpike maneuver positive to left side with nystagmus    Temp 98 F (36.7 C) (Temporal)   Ht 5\' 3"  (1.6 m)   Wt 181 lb (82.1 kg)   SpO2 96%   BMI 32.06 kg/m  Wt Readings from Last 3 Encounters:  11/29/20 181 lb (82.1 kg)  11/15/20 177 lb (80.3 kg)  10/18/20 183 lb (83 kg)     Health Maintenance Due  Topic Date Due   Hepatitis C Screening  Never done   Zoster Vaccines- Shingrix (1 of 2) Never done   COLONOSCOPY  (Pts 45-57yrs Insurance coverage will need to be confirmed)  07/11/2019    There are no preventive care reminders to display for this patient.  Lab Results  Component Value Date   TSH 2.73 02/08/2013   Lab Results  Component Value Date   WBC 6.3 07/27/2020   HGB 15.3 07/27/2020   HCT 44.1 07/27/2020   MCV 90.7 07/27/2020   PLT 179.0 07/27/2020   Lab Results  Component Value Date   NA 138 09/27/2020   K 4.1 09/27/2020   CO2 24 09/27/2020   GLUCOSE 115 (H) 09/27/2020   BUN 20 09/27/2020   CREATININE 1.49 09/27/2020   BILITOT 1.0 07/27/2020   ALKPHOS 55 07/27/2020   AST 23 07/27/2020   ALT 20 07/27/2020   PROT 7.0 07/27/2020   ALBUMIN 4.1 09/27/2020   CALCIUM 9.1 09/27/2020   ANIONGAP 11 10/11/2019   GFR 45.82 (L) 09/27/2020   Lab Results  Component Value Date   CHOL 110 07/27/2020   Lab Results  Component Value Date   HDL 42.30 07/27/2020   Lab Results  Component Value Date   LDLCALC 52 07/27/2020   Lab Results  Component Value Date   TRIG 77.0 07/27/2020   Lab Results  Component Value Date   CHOLHDL 3 07/27/2020   No results found for: HGBA1C    Assessment & Plan:   Problem List Items Addressed This Visit       Nervous and Auditory   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 - Primary    rx meclizine given to pt. Advised ok to take twice daily prn vertigo, however may/will cause drowsiness so pt informed not to drive machinery while taking and due to vertigo pt advised to rise slowly from seated to standing. If symptoms do not improve over the next 3-4 days will consider epley maneuver referral.       Relevant Medications   meclizine (ANTIVERT) 25 MG tablet     Other   Dizziness    Meds ordered this encounter  Medications   meclizine (ANTIVERT) 25 MG tablet    Sig: Take 1 tablet (25 mg total) by mouth 2 (two) times daily as needed for up to 7 days for dizziness.    Dispense:  14 tablet    Refill:  0    Order Specific Question:    Supervising Provider    Answer:   Diona Browner, AMY E [[id number]]    Follow-up: Return in about 1 week (around 12/06/2020), or if symptoms worsen or fail to improve.    Eugenia Pancoast, FNP

## 2020-12-04 ENCOUNTER — Other Ambulatory Visit: Payer: Self-pay | Admitting: Internal Medicine

## 2020-12-05 ENCOUNTER — Telehealth: Payer: Self-pay | Admitting: Cardiology

## 2020-12-05 NOTE — Telephone Encounter (Signed)
I had the patient unplug the monitor and plug it back in. He states that it was green again. I told him if it turns orange again to call tech support. The patient agreed again.

## 2020-12-05 NOTE — Telephone Encounter (Signed)
  1. Has your device fired? no  2. Is you device beeping? no  3. Are you experiencing draining or swelling at device site? no  4. Are you calling to see if we received your device transmission? no  5. Have you passed out? no  Patient states its flashing orange on left side  Please route to [place]

## 2020-12-14 ENCOUNTER — Ambulatory Visit (INDEPENDENT_AMBULATORY_CARE_PROVIDER_SITE_OTHER): Payer: Medicare PPO

## 2020-12-14 DIAGNOSIS — I495 Sick sinus syndrome: Secondary | ICD-10-CM

## 2020-12-17 LAB — CUP PACEART REMOTE DEVICE CHECK
Battery Remaining Longevity: 146 mo
Battery Voltage: 3.04 V
Brady Statistic AP VP Percent: 0.05 %
Brady Statistic AP VS Percent: 72 %
Brady Statistic AS VP Percent: 0.01 %
Brady Statistic AS VS Percent: 27.93 %
Brady Statistic RA Percent Paced: 72.14 %
Brady Statistic RV Percent Paced: 0.06 %
Date Time Interrogation Session: 20221202032606
Implantable Lead Implant Date: 20210903
Implantable Lead Implant Date: 20210903
Implantable Lead Location: 753859
Implantable Lead Location: 753860
Implantable Lead Model: 5076
Implantable Lead Model: 5076
Implantable Pulse Generator Implant Date: 20210903
Lead Channel Impedance Value: 285 Ohm
Lead Channel Impedance Value: 342 Ohm
Lead Channel Impedance Value: 342 Ohm
Lead Channel Impedance Value: 380 Ohm
Lead Channel Pacing Threshold Amplitude: 0.5 V
Lead Channel Pacing Threshold Amplitude: 0.625 V
Lead Channel Pacing Threshold Pulse Width: 0.4 ms
Lead Channel Pacing Threshold Pulse Width: 0.4 ms
Lead Channel Sensing Intrinsic Amplitude: 0.75 mV
Lead Channel Sensing Intrinsic Amplitude: 0.75 mV
Lead Channel Sensing Intrinsic Amplitude: 7 mV
Lead Channel Sensing Intrinsic Amplitude: 7 mV
Lead Channel Setting Pacing Amplitude: 1.5 V
Lead Channel Setting Pacing Amplitude: 2 V
Lead Channel Setting Pacing Pulse Width: 0.4 ms
Lead Channel Setting Sensing Sensitivity: 1.2 mV

## 2020-12-21 ENCOUNTER — Other Ambulatory Visit: Payer: Self-pay

## 2020-12-21 ENCOUNTER — Encounter: Payer: Self-pay | Admitting: Cardiology

## 2020-12-21 ENCOUNTER — Ambulatory Visit: Payer: Medicare PPO | Admitting: Cardiology

## 2020-12-21 VITALS — BP 130/90 | HR 71 | Ht 63.0 in | Wt 185.6 lb

## 2020-12-21 DIAGNOSIS — I495 Sick sinus syndrome: Secondary | ICD-10-CM

## 2020-12-21 DIAGNOSIS — R079 Chest pain, unspecified: Secondary | ICD-10-CM

## 2020-12-21 DIAGNOSIS — I10 Essential (primary) hypertension: Secondary | ICD-10-CM | POA: Diagnosis not present

## 2020-12-21 NOTE — Patient Instructions (Signed)
Medication Instructions:  ?Your physician recommends that you continue on your current medications as directed. Please refer to the Current Medication list given to you today. ? ?*If you need a refill on your cardiac medications before your next appointment, please call your pharmacy* ? ? ?Lab Work: ?None ordered ?If you have labs (blood work) drawn today and your tests are completely normal, you will receive your results only by: ?MyChart Message (if you have MyChart) OR ?A paper copy in the mail ?If you have any lab test that is abnormal or we need to change your treatment, we will call you to review the results. ? ? ?Testing/Procedures: ?None ordered ? ? ?Follow-Up: ?At [facility], you and your health needs are our priority.  As part of our continuing mission to provide you with exceptional heart care, we have created designated Provider Care Teams.  These Care Teams include your primary Cardiologist (physician) and Advanced Practice Providers (APPs -  Physician Assistants and Nurse Practitioners) who all work together to provide you with the care you need, when you need it. ? ?We recommend signing up for the patient portal called "MyChart".  Sign up information is provided on this After Visit Summary.  MyChart is used to connect with patients for Virtual Visits (Telemedicine).  Patients are able to view lab/test results, encounter notes, upcoming appointments, etc.  Non-urgent messages can be sent to your provider as well.   ?To learn more about what you can do with MyChart, go to [email or web address].   ? ?Your next appointment:   ?Your physician wants you to follow-up in: 1 year You will receive a reminder letter in the mail two months in advance. If you don't receive a letter, please call our office to schedule the follow-up appointment. ? ? ?The format for your next appointment:   ?In Person ? ?Provider:   ?You may see Brian Agbor-Etang, MD or one of the following Advanced Practice Providers on your  designated Care Team:   ?Christopher Berge, NP ?Ryan Dunn, PA-C ?Cadence Furth, PA-C{ ? ? ? ?Other Instructions ?N/A ? ?

## 2020-12-21 NOTE — Progress Notes (Signed)
Cardiology Office Note:    Date:  12/21/2020   ID:  Bobby Woods, DOB [birth year], MRN [id number]  PCP:  Venia Carbon, MD  [facility] Cardiologist:  [name], MD  [facility] Electrophysiologist:  Vickie Epley, MD   Referring MD: Venia Carbon, MD   No chief complaint on file.    History of Present Illness:    Bobby Woods is a 75 y.o. male with a hx of GERD, SSS s/p PPM, hypertension who presents for follow-up.   Being seen for pacemaker and also hypertension.  Toprol-XL was decreased to 12.5 mg daily, symptoms did not improve he currently takes Toprol-XL 25 mg daily.  Complains of left-sided chest pain, reproducible with palpation.  Otherwise feels well, has no other concerns at this time.  Prior notes Echocardiogram performed on 07/23/2019 showed normal systolic and diastolic function, EF 60 to 65%. 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 Coronary CTA, 08/2019 calcium score 0, no evidence of CAD.  Past Medical History:  Diagnosis Date   Arthritis    Blood transfusion without reported diagnosis    age 8 year    BPH (benign prostatic hypertrophy)    Chronic kidney disease    GERD (gastroesophageal reflux disease)    Hypertension    on medicines     Past Surgical History:  Procedure Laterality Date   COLONOSCOPY  2006   [place]   PACEMAKER IMPLANT N/A 09/16/2019   Procedure: PACEMAKER IMPLANT;  Surgeon: Vickie Epley, MD;  Location: [place] [facility];  Service: Cardiovascular;  Laterality: N/A;   PROSTATE BIOPSY  2011   negative   RETINAL DETACHMENT SURGERY  1985   left, right 05/07   ROTATOR CUFF REPAIR  10/09   right   TONSILLECTOMY  1972    Current Medications: Current Meds  Medication Sig   amLODipine (NORVASC) 5 MG tablet Take 1 tablet (5 mg total) by mouth daily.   clopidogrel (PLAVIX) 75 MG tablet Take 1 tablet by mouth daily.   finasteride (PROSCAR) 5 MG  tablet TAKE 1 TABLET BY MOUTH EVERY DAY   metoprolol succinate (TOPROL-XL) 25 MG 24 hr tablet Take 0.5 tablets (12.5 mg total) by mouth daily. (Patient taking differently: Take 25 mg by mouth daily.)   mirabegron ER (MYRBETRIQ) 25 MG TB24 tablet Take 1 tablet (25 mg total) by mouth daily.   pantoprazole (PROTONIX) 40 MG tablet TAKE 1 TABLET BY MOUTH EVERY DAY   tamsulosin (FLOMAX) 0.4 MG CAPS capsule Take 2 capsules (0.8 mg total) by mouth daily after supper.     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    Employer: [employer]   Occupation: Quality assurance--cleaning    Employer: [employer]    Comment:     Occupation: Custodial/funeral assistance    Comment: Financial controller  Tobacco Use   Smoking status: Never   Smokeless tobacco: Never  Vaping Use   Vaping Use: Never used  Substance and Sexual Activity   Alcohol use: No   Drug use: No   Sexual activity: Not on file  Other Topics Concern  Not on file  Social History Narrative   Has living will   Wife has [place] health care POA and alternate is [name] (cousin)   Would accept attempts at resuscitation but no prolonged artificial ventilation   Not sure about a feeding tube   Social Determinants of Health   Financial Resource Strain: Not on file  Food Insecurity: Not on file  Transportation Needs: Not on file  Physical Activity: Not on file  Stress: Not on file  Social Connections: Not on file     Family History: The patient's family history includes Cancer in his father and paternal uncle. There is no history of Colon cancer, Rectal cancer, or Stomach cancer.  ROS:   Please see the history of present illness.     All other systems  reviewed and are negative.  EKGs/Labs/Other Studies Reviewed:    The following studies were reviewed today:   EKG:  EKG is ordered today.  Sinus rhythm, occasional atrial paced rhythm.  Recent Labs: 07/27/2020: ALT 20; Hemoglobin 15.3; Platelets 179.0 09/27/2020: BUN 20; Creatinine, Ser 1.49; Potassium 4.1; Sodium 138  Recent Lipid Panel    Component Value Date/Time   CHOL 110 07/27/2020 0925   TRIG 77.0 07/27/2020 0925   HDL 42.30 07/27/2020 0925   CHOLHDL 3 07/27/2020 0925   VLDL 15.4 07/27/2020 0925   LDLCALC 52 07/27/2020 0925    Physical Exam:    VS:  BP 130/90   Pulse 71   Ht 5\' 3"  (1.6 m)   Wt 185 lb 9.6 oz (84.2 kg)   SpO2 96%   BMI 32.88 kg/m     Wt Readings from Last 3 Encounters:  12/21/20 185 lb 9.6 oz (84.2 kg)  11/29/20 181 lb (82.1 kg)  11/15/20 177 lb (80.3 kg)     GEN:  Well nourished, well developed in no acute distress HEENT: Normal NECK: No JVD; No carotid bruits CARDIAC: RRR, no murmurs, rubs, gallops RESPIRATORY:  Clear to auscultation without rales, wheezing or rhonchi  ABDOMEN: Soft, non-tender, non-distended MUSCULOSKELETAL:  No edema; No deformity, chest tenderness noted on palpation. SKIN: Warm and dry NEUROLOGIC:  Alert and oriented x 3 PSYCHIATRIC:  Normal affect   ASSESSMENT:    1. Tachycardia-bradycardia syndrome ([place])   2. Primary hypertension   3. Chest pain of uncertain etiology      PLAN:    In order of problems listed above:  tachy bradycardia syndrome s/p permanent pacemaker.  Keep follow-up appointment with device clinic for frequent checks.  Reduce Toprol-XL to 12.5 mg daily. History of hypertension, blood pressure controlled.  Continue Toprol-XL 25 mg daily Chest pain reproducible with palpation consistent with musculoskeletal etiology.  NSAIDs advised.  Follow-up with PCP regarding management.  Prior echo and coronary CTA was normal.  Follow-up in 1 year  Total encounter time 30 minutes  Greater than 50% was  spent in counseling and coordination of care with the patient   This note was generated in part or whole with voice recognition software. Voice recognition is usually quite accurate but there are transcription errors that can and very often do occur. I apologize for any typographical errors that were not detected and corrected.  Medication Adjustments/Labs and Tests Ordered: Current medicines are reviewed at length with the patient today.  Concerns regarding medicines are outlined above.  No orders of the defined types were placed in this encounter.   No orders of the defined types were placed in this encounter.    Patient Instructions  Medication Instructions:  Your physician recommends that you continue on your current medications as directed. Please refer to the Current Medication list given to you today.  *If you need a refill on your cardiac medications before your next appointment, please call your pharmacy*   Lab Work: None ordered If you have labs (blood work) drawn today and your tests are completely normal, you will receive your results only by: [place] (if you have MyChart) OR A paper copy in the mail If you have any lab test that is abnormal or we need to change your treatment, we will call you to review the results.   Testing/Procedures: None ordered   Follow-Up: At [facility], you and your health needs are our priority.  As part of our continuing mission to provide you with exceptional heart care, we have created designated Provider Care Teams.  These Care Teams include your primary Cardiologist (physician) and Advanced Practice Providers (APPs -  Physician Assistants and Nurse Practitioners) who all work together to provide you with the care you need, when you need it.  We recommend signing up for the patient portal called "MyChart".  Sign up information is provided on this After Visit Summary.  MyChart is used to connect with patients for Virtual Visits  (Telemedicine).  Patients are able to view lab/test results, encounter notes, upcoming appointments, etc.  Non-urgent messages can be sent to your provider as well.   To learn more about what you can do with MyChart, go to [email or web address].    Your next appointment:   Your physician wants you to follow-up in: 1 year You will receive a reminder letter in the mail two months in advance. If you don't receive a letter, please call our office to schedule the follow-up appointment.   The format for your next appointment:   In Person  Provider:   You may see [name], MD or one of the following Advanced Practice Providers on your designated Care Team:   Murray Hodgkins, NP Christell Faith, PA-C Cadence Kathlen Mody, [place]    Other Instructions N/A   Signed, [name], MD  12/21/2020 4:51 PM    [place]

## 2020-12-24 ENCOUNTER — Telehealth: Payer: Self-pay | Admitting: Internal Medicine

## 2020-12-24 NOTE — Telephone Encounter (Signed)
Tried to call pt. Person who answered advised he would be home in about an hour. I will try to call him back later this afternoon.

## 2020-12-24 NOTE — Telephone Encounter (Signed)
Pt called stating that he had a visit with the Cardiology on 12/21/20. Pt states he is still having a little chest pain. Pt is asking if he could take tylenol. Please advise.

## 2020-12-24 NOTE — Telephone Encounter (Signed)
Spoke to pt

## 2020-12-25 NOTE — Progress Notes (Signed)
Remote pacemaker transmission.   

## 2020-12-30 ENCOUNTER — Ambulatory Visit
Admission: EM | Admit: 2020-12-30 | Discharge: 2020-12-30 | Disposition: A | Discharge status: Home / Self Care | Payer: Medicare PPO | Attending: Emergency Medicine | Admitting: Emergency Medicine

## 2020-12-30 ENCOUNTER — Other Ambulatory Visit: Payer: Self-pay

## 2020-12-30 ENCOUNTER — Encounter: Payer: Self-pay | Admitting: Emergency Medicine

## 2020-12-30 DIAGNOSIS — J069 Acute upper respiratory infection, unspecified: Secondary | ICD-10-CM

## 2020-12-30 MED ORDER — BENZONATATE 100 MG PO CAPS: 100.0000 mg | ORAL_CAPSULE | Freq: Three times a day (TID) | ORAL | 0 refills | Status: DC | PRN

## 2020-12-30 NOTE — ED Provider Notes (Signed)
Bobby Woods    CSN: [id number] Arrival date & time: 12/30/20  1249      History   Chief Complaint Chief Complaint  Patient presents with   Cough   Sore Throat   Nasal Congestion    HPI MAURY GRONINGER is a 75 y.o. male.  Parents with congestion and cough x2 days.  He denies fever, rash, shortness of breath, vomiting, diarrhea, or other symptoms.  No treatments attempted at home.  His medical history includes hypertension, tachycardia-bradycardia syndrome, pacemaker, CKD.  The history is provided by the patient and medical records.   Past Medical History:  Diagnosis Date   Arthritis    Blood transfusion without reported diagnosis    age 89 year    BPH (benign prostatic hypertrophy)    Chronic kidney disease    GERD (gastroesophageal reflux disease)    Hypertension    on medicines     Patient Active Problem List   Diagnosis Date Noted   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 11/29/2020   Dizziness 11/29/2020   Urinary frequency 09/27/2020   Tachycardia-bradycardia syndrome ([place]) 12/21/2019   Pacemaker 12/21/2019   RUQ pain 01/03/2019   Chronic renal disease, stage III ([place]) 07/20/2018   Lower back pain 11/10/2014   Advance directive discussed with patient 04/25/2014   Routine general medical examination at a health care facility 01/22/2011   Actinic keratoses 01/22/2011   GERD (gastroesophageal reflux disease)    BPH with obstruction/lower urinary tract symptoms 01/03/2009   OSTEOARTHRITIS 12/14/2006   Essential hypertension, benign 05/25/2006    Past Surgical History:  Procedure Laterality Date   COLONOSCOPY  2006   [place]   PACEMAKER IMPLANT N/A 09/16/2019   Procedure: PACEMAKER IMPLANT;  Surgeon: Vickie Epley, MD;  Location: [facility];  Service: Cardiovascular;  Laterality: N/A;   PROSTATE BIOPSY  2011   negative   RETINAL DETACHMENT SURGERY  1985   left, right 05/07   ROTATOR CUFF  REPAIR  10/09   right   TONSILLECTOMY  1972       Home Medications    Prior to Admission medications   Medication Sig Start Date End Date Taking? Authorizing Provider  benzonatate (TESSALON) 100 MG capsule Take 1 capsule (100 mg total) by mouth 3 (three) times daily as needed for cough. 12/30/20  Yes Sharion Balloon, NP  amLODipine (NORVASC) 5 MG tablet Take 1 tablet (5 mg total) by mouth daily. 08/07/20 12/21/20  Theora Gianotti, NP  clopidogrel (PLAVIX) 75 MG tablet Take 1 tablet by mouth daily.    [provider]  finasteride (PROSCAR) 5 MG tablet TAKE 1 TABLET BY MOUTH EVERY DAY 12/04/20   Venia Carbon, MD  meloxicam (MOBIC) 15 MG tablet Take 1 tablet (15 mg total) by mouth daily as needed for pain. Patient not taking: Reported on 10/18/2020 07/27/20   Viviana Simpler I, MD  metoprolol succinate (TOPROL-XL) 25 MG 24 hr tablet Take 0.5 tablets (12.5 mg total) by mouth daily. Patient taking differently: Take 25 mg by mouth daily. 07/03/20   [name], MD  mirabegron ER (MYRBETRIQ) 25 MG TB24 tablet Take 1 tablet (25 mg total) by mouth daily. 11/15/20   Vaillancourt, Aldona Bar, PA-C  pantoprazole (PROTONIX) 40 MG tablet TAKE 1 TABLET BY MOUTH EVERY DAY 09/26/20   Viviana Simpler I, MD  tamsulosin (FLOMAX) 0.4 MG CAPS capsule Take 2 capsules (0.8 mg total) by  mouth daily after supper. 09/27/20   Venia Carbon, MD    Family History Family History  Problem Relation Age of Onset   Cancer Father        prostate   Cancer Paternal Uncle        prostate cancer   Colon cancer Neg Hx    Rectal cancer Neg Hx    Stomach cancer Neg Hx     Social History Social History   Tobacco Use   Smoking status: Never   Smokeless tobacco: Never  Vaping Use   Vaping Use: Never used  Substance Use Topics   Alcohol use: No   Drug use: No     Allergies   Zithromax [azithromycin dihydrate]   Review of Systems Review of Systems  Constitutional:  Negative for chills  and fever.  HENT:  Positive for congestion. Negative for ear pain and sore throat.   Respiratory:  Positive for cough. Negative for shortness of breath.   Cardiovascular:  Negative for chest pain and palpitations.  Gastrointestinal:  Negative for diarrhea and vomiting.  Skin:  Negative for color change and rash.  All other systems reviewed and are negative.   Physical Exam Triage Vital Signs ED Triage Vitals [12/30/20 1401]  Enc Vitals Group     BP 136/87     Pulse Rate 82     Resp 18     Temp 98.4 F (36.9 C)     Temp src      SpO2 95 %     Weight      Height      Head Circumference      Peak Flow      Pain Score      Pain Loc      Pain Edu?      Excl. in [place]?    No data found.  Updated Vital Signs BP 136/87 (BP Location: Left Arm)    Pulse 82    Temp 98.4 F (36.9 C)    Resp 18    SpO2 95%   Visual Acuity Right Eye Distance:   Left Eye Distance:   Bilateral Distance:    Right Eye Near:   Left Eye Near:    Bilateral Near:     Physical Exam Vitals and nursing note reviewed.  Constitutional:      General: He is not in acute distress.    Appearance: He is well-developed. He is not ill-appearing.  HENT:     Right Ear: Tympanic membrane normal.     Left Ear: Tympanic membrane normal.     Nose: Nose normal.     Mouth/Throat:     Mouth: Mucous membranes are moist.     Pharynx: Oropharynx is clear.  Cardiovascular:     Rate and Rhythm: Normal rate and regular rhythm.     Heart sounds: Normal heart sounds.  Pulmonary:     Effort: Pulmonary effort is normal. No respiratory distress.     Breath sounds: Normal breath sounds.  Musculoskeletal:     Cervical back: Neck supple.  Skin:    General: Skin is warm and dry.  Neurological:     Mental Status: He is alert.  Psychiatric:        Mood and Affect: Mood normal.        Behavior: Behavior normal.     UC Treatments / Results  Labs (all labs ordered are listed, but only abnormal results are displayed) Labs  Reviewed - No data to  display  EKG   Radiology No results found.  Procedures Procedures (including critical care time)  Medications Ordered in UC Medications - No data to display  Initial Impression / Assessment and Plan / UC Course  I have reviewed the triage vital signs and the nursing notes.  Pertinent labs & imaging results that were available during my care of the patient were reviewed by me and considered in my medical decision making (see chart for details).    Viral URI.  Lungs are clear, O2 sat 95% on room air.  Patient declines COVID or flu test.  Treating cough with Tessalon Perles.  Instructed him to follow up with his PCP if his symptoms are not improving.  He agrees to plan of care.    Final Clinical Impressions(s) / UC Diagnoses   Final diagnoses:  Viral URI     Discharge Instructions      Take the Tessalon Perles as needed for cough.  Follow up with your primary care provider if your symptoms are not improving.         ED Prescriptions     Medication Sig Dispense Auth. Provider   benzonatate (TESSALON) 100 MG capsule Take 1 capsule (100 mg total) by mouth 3 (three) times daily as needed for cough. 21 capsule Sharion Balloon, NP      PDMP not reviewed this encounter.   Sharion Balloon, NP 12/30/20 1434

## 2020-12-30 NOTE — ED Triage Notes (Signed)
Pt here with 2 day hx of URI sx without fever. Does not want any viral testing.

## 2020-12-30 NOTE — Discharge Instructions (Addendum)
Take the Tessalon Perles as needed for cough.  Follow up with your primary care provider if your symptoms are not improving.    

## 2021-01-05 ENCOUNTER — Other Ambulatory Visit: Payer: Self-pay | Admitting: Internal Medicine

## 2021-01-10 DIAGNOSIS — H5213 Myopia, bilateral: Secondary | ICD-10-CM | POA: Diagnosis not present

## 2021-01-10 DIAGNOSIS — H4423 Degenerative myopia, bilateral: Secondary | ICD-10-CM | POA: Diagnosis not present

## 2021-02-15 DIAGNOSIS — D225 Melanocytic nevi of trunk: Secondary | ICD-10-CM | POA: Diagnosis not present

## 2021-02-15 DIAGNOSIS — L538 Other specified erythematous conditions: Secondary | ICD-10-CM | POA: Diagnosis not present

## 2021-02-15 DIAGNOSIS — L820 Inflamed seborrheic keratosis: Secondary | ICD-10-CM | POA: Diagnosis not present

## 2021-02-15 DIAGNOSIS — D2261 Melanocytic nevi of right upper limb, including shoulder: Secondary | ICD-10-CM | POA: Diagnosis not present

## 2021-02-15 DIAGNOSIS — D2272 Melanocytic nevi of left lower limb, including hip: Secondary | ICD-10-CM | POA: Diagnosis not present

## 2021-02-15 DIAGNOSIS — L821 Other seborrheic keratosis: Secondary | ICD-10-CM | POA: Diagnosis not present

## 2021-02-15 DIAGNOSIS — D2262 Melanocytic nevi of left upper limb, including shoulder: Secondary | ICD-10-CM | POA: Diagnosis not present

## 2021-02-15 DIAGNOSIS — C44329 Squamous cell carcinoma of skin of other parts of face: Secondary | ICD-10-CM | POA: Diagnosis not present

## 2021-02-15 DIAGNOSIS — L814 Other melanin hyperpigmentation: Secondary | ICD-10-CM | POA: Diagnosis not present

## 2021-02-15 DIAGNOSIS — D2271 Melanocytic nevi of right lower limb, including hip: Secondary | ICD-10-CM | POA: Diagnosis not present

## 2021-02-19 ENCOUNTER — Other Ambulatory Visit: Payer: Self-pay

## 2021-02-19 ENCOUNTER — Telehealth: Payer: Self-pay

## 2021-02-19 ENCOUNTER — Ambulatory Visit: Payer: Medicare PPO | Admitting: Internal Medicine

## 2021-02-19 ENCOUNTER — Encounter: Payer: Self-pay | Admitting: Internal Medicine

## 2021-02-19 DIAGNOSIS — H6981 Other specified disorders of Eustachian tube, right ear: Secondary | ICD-10-CM | POA: Insufficient documentation

## 2021-02-19 MED ORDER — PREDNISONE 20 MG PO TABS: 40.0000 mg | ORAL_TABLET | Freq: Every day | ORAL | 0 refills | Status: DC

## 2021-02-19 NOTE — Telephone Encounter (Signed)
[facility] - Client TELEPHONE ADVICE RECORD [facility] Patient Name: Bobby Woods Gender: Male DOB: March 02, 1945 Age: 76 Y 3 M 14 D Return Phone Number: [phone] (Secondary) Address: City/ State/ Zip: [place] [address] Client [facility] - Client Client Site [place] - Day Provider Viviana Simpler- MD Contact Type Call Who Is Calling Patient / Member / Family / Caregiver Call Type Triage / Clinical Relationship To Patient Self Return Phone Number [phone] (Secondary) Chief Complaint Ears ringing Reason for Call Symptomatic / Request for Health Information Initial Comment Caller states, pt right ear is popping/ rattle. Translation No Nurse Assessment Nurse: Vallery Sa, RN, Cathy Date/Time (Eastern Time): 02/19/2021 10:21:28 AM Confirm and document reason for call. If symptomatic, describe symptoms. ---[name] states he developed right ear congestions about 2 days ago. No pain, fever or ear drainage. Alert and responsive. Does the patient have any new or worsening symptoms? ---Yes Will a triage be completed? ---Yes Related visit to physician within the last 2 weeks? ---No Does the PT have any chronic conditions? (i.e. diabetes, asthma, this includes High risk factors for pregnancy, etc.) ---Yes List chronic conditions. ---Kidney Disease, High Blood Pressure, Prostate Enlargement, Pacemaker Is this a behavioral health or substance abuse call? ---No Guidelines Guideline Title Affirmed Question Affirmed Notes Nurse Date/Time Eilene Ghazi Time) Ear - Congestion Ear congestion present > 48 hours [place], RN, [facility] 02/19/2021 10:23:23 AM Disp. Time Eilene Ghazi Time) Disposition Final User 02/19/2021 10:26:42 AM SEE PCP WITHIN 3 DAYS Yes [place], RN, Rosey Bath Disagree/Comply Comply PLEASE NOTE: All timestamps contained within this report are represented as [place] Standard  Time. CONFIDENTIALTY NOTICE: This fax transmission is intended only for the addressee. It contains information that is legally privileged, confidential or otherwise protected from use or disclosure. If you are not the intended recipient, you are strictly prohibited from reviewing, disclosing, copying using or disseminating any of this information or taking any action in reliance on or regarding this information. If you have received this fax in error, please notify us immediately by telephone so that we can arrange for its return to [place]. Phone: [phone], Toll-Free: [phone], Fax: [phone] Page: 2 of 2 Call Id: [id number] [place] Understands Yes PreDisposition Call Doctor Care Advice Given Per Guideline SEE PCP WITHIN 3 DAYS: * You need to be seen within 2 or 3 days. * PCP VISIT: Call your doctor (or NP/PA) during regular office hours and make an appointment. A clinic or urgent care center are good places to go for care if your doctor's office is closed or you can't get an appointment. NOTE: If office will be open tomorrow, tell caller to call then, not in 3 days. TREATMENT - CHEWING AND SWALLOWING: * Try chewing gum. * You can also try swallowing water while pinching your nostrils closed. The reason this works is that it creates a small vacuum in the nose. This helps reduce pressure in the middle ear. CALL BACK IF: * Severe ear pain occurs * You become worse CARE ADVICE given per Ear - Congestion (Adult) guideline. Referrals REFERRED TO PCP OFFIC

## 2021-02-19 NOTE — Telephone Encounter (Signed)
See OV note.  

## 2021-02-19 NOTE — Progress Notes (Signed)
Subjective:    Patient ID: Bobby Woods, male    DOB: 1945-10-07, 76 y.o.   MRN: [id number]  HPI Here due to popping in his right ear  Started 3-4 days ago Feels like popping or "like paper crackling in there" No wax problems No pain Has nasal drainage---goes back to recent cold No cough No headaches  Did have URI last month Told fluid but no infection  Hasn't tried any treatment  Current Outpatient Medications on File Prior to Visit  Medication Sig Dispense Refill   clopidogrel (PLAVIX) 75 MG tablet TAKE 1 TABLET BY MOUTH EVERY DAY 90 tablet 3   finasteride (PROSCAR) 5 MG tablet TAKE 1 TABLET BY MOUTH EVERY DAY 90 tablet 3   metoprolol succinate (TOPROL-XL) 25 MG 24 hr tablet Take 0.5 tablets (12.5 mg total) by mouth daily. (Patient taking differently: Take 25 mg by mouth daily.) 90 tablet 3   mirabegron ER (MYRBETRIQ) 25 MG TB24 tablet Take 1 tablet (25 mg total) by mouth daily. 30 tablet 11   pantoprazole (PROTONIX) 40 MG tablet TAKE 1 TABLET BY MOUTH EVERY DAY 90 tablet 3   tamsulosin (FLOMAX) 0.4 MG CAPS capsule Take 2 capsules (0.8 mg total) by mouth daily after supper. 180 capsule 3   amLODipine (NORVASC) 5 MG tablet Take 1 tablet (5 mg total) by mouth daily. 30 tablet 3   No current facility-administered medications on file prior to visit.    Allergies  Allergen Reactions   Zithromax [Azithromycin Dihydrate]     GI upset    Past Medical History:  Diagnosis Date   Arthritis    Blood transfusion without reported diagnosis    age 68 year    BPH (benign prostatic hypertrophy)    Chronic kidney disease    GERD (gastroesophageal reflux disease)    Hypertension    on medicines     Past Surgical History:  Procedure Laterality Date   COLONOSCOPY  2006   [place]   PACEMAKER IMPLANT N/A 09/16/2019   Procedure: PACEMAKER IMPLANT;  Surgeon: Vickie Epley, MD;  Location: [facility];  Service: Cardiovascular;   Laterality: N/A;   PROSTATE BIOPSY  2011   negative   RETINAL DETACHMENT SURGERY  1985   left, right 05/07   ROTATOR CUFF REPAIR  10/09   right   TONSILLECTOMY  1972    Family History  Problem Relation Age of Onset   Cancer Father        prostate   Cancer Paternal Uncle        prostate cancer   Colon cancer Neg Hx    Rectal cancer Neg Hx    Stomach cancer Neg Hx     Social History   Socioeconomic History   Marital status: Married    Spouse name: Not on file   Number of children: 3   Years of education: Not on file   Highest education level: Not on file  Occupational History   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    Employer: [employer]   Occupation: Quality assurance--cleaning    Employer: [employer]    Comment:     Occupation: Custodial/funeral assistance    Comment: [employer]  Tobacco Use   Smoking status: Never   Smokeless tobacco: Never  Vaping Use   Vaping Use: Never used  Substance and Sexual Activity   Alcohol use: No   Drug  use: No   Sexual activity: Not on file  Other Topics Concern   Not on file  Social History Narrative   Has living will   Wife has [place] health care POA and alternate is [name] (cousin)   Would accept attempts at resuscitation but no prolonged artificial ventilation   Not sure about a feeding tube   Social Determinants of Health   Financial Resource Strain: Not on file  Food Insecurity: Not on file  Transportation Needs: Not on file  Physical Activity: Not on file  Stress: Not on file  Social Connections: Not on file  Intimate Partner Violence: Not on file   Review of Systems No recent travel No N/V Very slight dizziness briefly Had tinnitus once--2 days ago No change in hearing --not great to begin with    Objective:   Physical Exam Constitutional:      Appearance: Normal appearance.  HENT:     Head:     Comments: No sinus tenderness    Right Ear: Tympanic membrane and ear canal normal.      Left Ear: Tympanic membrane and ear canal normal.     Nose:     Comments: Moderate pale nasal congestion    Mouth/Throat:     Pharynx: No oropharyngeal exudate or posterior oropharyngeal erythema.  Pulmonary:     Effort: Pulmonary effort is normal.     Breath sounds: Normal breath sounds. No wheezing or rales.  Musculoskeletal:     Cervical back: Neck supple.  Lymphadenopathy:     Cervical: No cervical adenopathy.  Neurological:     Mental Status: He is alert.           Assessment & Plan:

## 2021-02-19 NOTE — Telephone Encounter (Signed)
Per appt notes pt already has appt 02/19/21 at 11:45 with Dr Silvio Pate. Pt has already been arrived. Sending this note to Dr Silvio Pate and Larene Beach CMA.

## 2021-02-19 NOTE — Assessment & Plan Note (Signed)
No evidence of infection No clear fluid Will try 3 days of prednisone 40mg  No antibiotics needed

## 2021-03-11 ENCOUNTER — Other Ambulatory Visit: Payer: Self-pay | Admitting: *Deleted

## 2021-03-11 MED ORDER — AMLODIPINE BESYLATE 5 MG PO TABS: 5.0000 mg | ORAL_TABLET | Freq: Every day | ORAL | 10 refills | Status: DC

## 2021-03-15 ENCOUNTER — Ambulatory Visit (INDEPENDENT_AMBULATORY_CARE_PROVIDER_SITE_OTHER): Payer: Medicare PPO

## 2021-03-15 DIAGNOSIS — I495 Sick sinus syndrome: Secondary | ICD-10-CM | POA: Diagnosis not present

## 2021-03-15 DIAGNOSIS — C44329 Squamous cell carcinoma of skin of other parts of face: Secondary | ICD-10-CM | POA: Diagnosis not present

## 2021-03-15 DIAGNOSIS — L905 Scar conditions and fibrosis of skin: Secondary | ICD-10-CM | POA: Diagnosis not present

## 2021-03-15 LAB — CUP PACEART REMOTE DEVICE CHECK
Battery Remaining Longevity: 143 mo
Battery Voltage: 3.03 V
Brady Statistic AP VP Percent: 0.05 %
Brady Statistic AP VS Percent: 79.9 %
Brady Statistic AS VP Percent: 0.01 %
Brady Statistic AS VS Percent: 20.04 %
Brady Statistic RA Percent Paced: 80.02 %
Brady Statistic RV Percent Paced: 0.05 %
Date Time Interrogation Session: 20230302211034
Implantable Lead Implant Date: 20210903
Implantable Lead Implant Date: 20210903
Implantable Lead Location: 753859
Implantable Lead Location: 753860
Implantable Lead Model: 5076
Implantable Lead Model: 5076
Implantable Pulse Generator Implant Date: 20210903
Lead Channel Impedance Value: 285 Ohm
Lead Channel Impedance Value: 323 Ohm
Lead Channel Impedance Value: 342 Ohm
Lead Channel Impedance Value: 361 Ohm
Lead Channel Pacing Threshold Amplitude: 0.5 V
Lead Channel Pacing Threshold Amplitude: 0.75 V
Lead Channel Pacing Threshold Pulse Width: 0.4 ms
Lead Channel Pacing Threshold Pulse Width: 0.4 ms
Lead Channel Sensing Intrinsic Amplitude: 1.25 mV
Lead Channel Sensing Intrinsic Amplitude: 1.25 mV
Lead Channel Sensing Intrinsic Amplitude: 5.625 mV
Lead Channel Sensing Intrinsic Amplitude: 5.625 mV
Lead Channel Setting Pacing Amplitude: 1.5 V
Lead Channel Setting Pacing Amplitude: 2 V
Lead Channel Setting Pacing Pulse Width: 0.4 ms
Lead Channel Setting Sensing Sensitivity: 1.2 mV

## 2021-03-25 ENCOUNTER — Other Ambulatory Visit: Payer: Self-pay | Admitting: Internal Medicine

## 2021-03-26 NOTE — Progress Notes (Signed)
Remote pacemaker transmission.   

## 2021-05-03 ENCOUNTER — Telehealth: Payer: Self-pay | Admitting: Physician Assistant

## 2021-05-03 NOTE — Telephone Encounter (Signed)
mirabegron ER (MYRBETRIQ) 25 MG TB24 tablet ? ?Per Patient [employer] needs to switch to a cheaper medication, Oxybutynin Choloride tab  or Tolterodine ER capsule. Please advise patient? ?

## 2021-05-06 NOTE — Telephone Encounter (Signed)
Attempted to contact patient to clarify; LMOM to return my call. ?

## 2021-05-07 NOTE — Telephone Encounter (Signed)
I just spoke with the patient via telephone.  He reports that the Myrbetriq 25 mg is working well for him and causes approximately $40 per month with insurance, which is not cost prohibitive for him.  We discussed that while anticholinergics are cheaper, they do carry increased risks of side effects including constipation, dry mouth, dry eye, and cognitive decline with long-term use.  Based on this conversation, patient wishes to continue with Myrbetriq 25 mg daily, which is reasonable. ?

## 2021-05-17 DIAGNOSIS — N400 Enlarged prostate without lower urinary tract symptoms: Secondary | ICD-10-CM | POA: Diagnosis not present

## 2021-05-17 DIAGNOSIS — I471 Supraventricular tachycardia: Secondary | ICD-10-CM | POA: Diagnosis not present

## 2021-05-17 DIAGNOSIS — N3941 Urge incontinence: Secondary | ICD-10-CM | POA: Diagnosis not present

## 2021-05-17 DIAGNOSIS — I10 Essential (primary) hypertension: Secondary | ICD-10-CM | POA: Diagnosis not present

## 2021-05-17 DIAGNOSIS — K219 Gastro-esophageal reflux disease without esophagitis: Secondary | ICD-10-CM | POA: Diagnosis not present

## 2021-05-17 DIAGNOSIS — D6869 Other thrombophilia: Secondary | ICD-10-CM | POA: Diagnosis not present

## 2021-05-17 DIAGNOSIS — E669 Obesity, unspecified: Secondary | ICD-10-CM | POA: Diagnosis not present

## 2021-05-17 DIAGNOSIS — I495 Sick sinus syndrome: Secondary | ICD-10-CM | POA: Diagnosis not present

## 2021-05-17 DIAGNOSIS — I4891 Unspecified atrial fibrillation: Secondary | ICD-10-CM | POA: Diagnosis not present

## 2021-06-14 ENCOUNTER — Ambulatory Visit (INDEPENDENT_AMBULATORY_CARE_PROVIDER_SITE_OTHER): Payer: Medicare PPO

## 2021-06-14 DIAGNOSIS — I495 Sick sinus syndrome: Secondary | ICD-10-CM

## 2021-06-16 LAB — CUP PACEART REMOTE DEVICE CHECK
Battery Remaining Longevity: 140 mo
Battery Voltage: 3.03 V
Brady Statistic AP VP Percent: 0.05 %
Brady Statistic AP VS Percent: 82.37 %
Brady Statistic AS VP Percent: 0 %
Brady Statistic AS VS Percent: 17.57 %
Brady Statistic RA Percent Paced: 82.45 %
Brady Statistic RV Percent Paced: 0.05 %
Date Time Interrogation Session: 20230602064623
Implantable Lead Implant Date: 20210903
Implantable Lead Implant Date: 20210903
Implantable Lead Location: 753859
Implantable Lead Location: 753860
Implantable Lead Model: 5076
Implantable Lead Model: 5076
Implantable Pulse Generator Implant Date: 20210903
Lead Channel Impedance Value: 285 Ohm
Lead Channel Impedance Value: 304 Ohm
Lead Channel Impedance Value: 342 Ohm
Lead Channel Impedance Value: 361 Ohm
Lead Channel Pacing Threshold Amplitude: 0.5 V
Lead Channel Pacing Threshold Amplitude: 0.625 V
Lead Channel Pacing Threshold Pulse Width: 0.4 ms
Lead Channel Pacing Threshold Pulse Width: 0.4 ms
Lead Channel Sensing Intrinsic Amplitude: 1.75 mV
Lead Channel Sensing Intrinsic Amplitude: 1.75 mV
Lead Channel Sensing Intrinsic Amplitude: 6.125 mV
Lead Channel Sensing Intrinsic Amplitude: 6.125 mV
Lead Channel Setting Pacing Amplitude: 1.5 V
Lead Channel Setting Pacing Amplitude: 2 V
Lead Channel Setting Pacing Pulse Width: 0.4 ms
Lead Channel Setting Sensing Sensitivity: 1.2 mV

## 2021-06-21 NOTE — Progress Notes (Signed)
Remote pacemaker transmission.   

## 2021-06-25 ENCOUNTER — Telehealth: Payer: Self-pay | Admitting: Cardiology

## 2021-06-25 NOTE — Telephone Encounter (Signed)
Pt c/o BP issue: STAT if pt c/o blurred vision, one-sided weakness or slurred speech  1. What are your last 5 BP readings?  134/78 146/85 117/74 114/71 147/90  2. Are you having any other symptoms (ex. Dizziness, headache, blurred vision, passed out)? Not now - sometimes dizzy   3. What is your BP issue? High BP

## 2021-06-27 NOTE — Telephone Encounter (Signed)
On Saturday at 7 pm patient stated that he was not feeling great and he took his BP which was 185/82 . 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  Patient states that lately he has been feeling like his balance is a little off, and he feels a bit dizzy at times. I was able to offer him an appointment with Dr. Garen Lah for Monday 6/19. Patient was very grateful for the accomodation and stated that he would be here then.

## 2021-07-01 ENCOUNTER — Encounter: Payer: Self-pay | Admitting: Cardiology

## 2021-07-01 ENCOUNTER — Ambulatory Visit: Payer: Medicare PPO | Admitting: Cardiology

## 2021-07-01 VITALS — BP 128/91 | HR 76 | Ht 64.0 in | Wt 182.1 lb

## 2021-07-01 DIAGNOSIS — R42 Dizziness and giddiness: Secondary | ICD-10-CM

## 2021-07-01 DIAGNOSIS — I10 Essential (primary) hypertension: Secondary | ICD-10-CM

## 2021-07-01 DIAGNOSIS — I495 Sick sinus syndrome: Secondary | ICD-10-CM | POA: Diagnosis not present

## 2021-07-01 NOTE — Patient Instructions (Signed)
Medication Instructions:   Your physician recommends that you continue on your current medications as directed. Please refer to the Current Medication list given to you today.   *If you need a refill on your cardiac medications before your next appointment, please call your pharmacy*  Follow-Up: At [facility], you and your health needs are our priority.  As part of our continuing mission to provide you with exceptional heart care, we have created designated Provider Care Teams.  These Care Teams include your primary Cardiologist (physician) and Advanced Practice Providers (APPs -  Physician Assistants and Nurse Practitioners) who all work together to provide you with the care you need, when you need it.  We recommend signing up for the patient portal called "MyChart".  Sign up information is provided on this After Visit Summary.  MyChart is used to connect with patients for Virtual Visits (Telemedicine).  Patients are able to view lab/test results, encounter notes, upcoming appointments, etc.  Non-urgent messages can be sent to your provider as well.   To learn more about what you can do with MyChart, go to [email or web address]://[email or web address].com.    Your next appointment:   1 year(s)  The format for your next appointment:   In Person  Provider:   Brian Agbor-Etang, MD    Other Instructions   Important Information About Sugar       

## 2021-07-01 NOTE — Progress Notes (Signed)
Cardiology Office Note:    Date:  07/01/2021   ID:  Bobby Woods, DOB 15-Mar-1945, MRN [id number]  PCP:  Venia Carbon, MD  [facility] Cardiologist:  [name], MD  [facility] Electrophysiologist:  Vickie Epley, MD   Referring MD: Venia Carbon, MD   Chief Complaint  Patient presents with   Other    Elevated BP/dizziness. Meds reviewed verbally with pt.    History of Present Illness:    Bobby Woods is a 76 y.o. male with a hx of GERD, SSS s/p PPM, hypertension who presents due to feeling dizzy and blood pressures being elevated.  States being under a lot of stress a week ago at home due to son drinking heavily despite having epilepsy.  He checked his blood pressures and systolics were noted to be 185.  The next day systolic was 409.  Takes amlodipine and Toprol-XL for blood pressure as prescribed.  Over the next 5 days, his blood pressure was normal.  He states feeling dizzy moving from seated to standing position and sometimes when he bends over to tie his shoelaces.  Denies passing out.  States having a history of vertigo years ago, treated with meclizine.   Prior notes Echocardiogram performed on 07/23/2019 showed normal systolic and diastolic function, EF 60 to 65%. 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 Coronary CTA, 08/2019 calcium score 0, no evidence of CAD.  Past Medical History:  Diagnosis Date   Arthritis    Blood transfusion without reported diagnosis    age 83 year    BPH (benign prostatic hypertrophy)    Chronic kidney disease    GERD (gastroesophageal reflux disease)    Hypertension    on medicines     Past Surgical History:  Procedure Laterality Date   COLONOSCOPY  2006   [place]   PACEMAKER IMPLANT N/A 09/16/2019   Procedure: PACEMAKER IMPLANT;  Surgeon: Vickie Epley, MD;  Location: [facility];  Service: Cardiovascular;  Laterality: N/A;   PROSTATE  BIOPSY  2011   negative   RETINAL DETACHMENT SURGERY  1985   left, right 05/07   ROTATOR CUFF REPAIR  10/09   right   TONSILLECTOMY  1972    Current Medications: Current Meds  Medication Sig   amLODipine (NORVASC) 5 MG tablet Take 1 tablet (5 mg total) by mouth daily.   clopidogrel (PLAVIX) 75 MG tablet TAKE 1 TABLET BY MOUTH EVERY DAY   finasteride (PROSCAR) 5 MG tablet TAKE 1 TABLET BY MOUTH EVERY DAY   metoprolol succinate (TOPROL-XL) 25 MG 24 hr tablet TAKE 1 TABLET BY MOUTH EVERY DAY   mirabegron ER (MYRBETRIQ) 25 MG TB24 tablet Take 1 tablet (25 mg total) by mouth daily.   pantoprazole (PROTONIX) 40 MG tablet TAKE 1 TABLET BY MOUTH EVERY DAY   tamsulosin (FLOMAX) 0.4 MG CAPS capsule Take 2 capsules (0.8 mg total) by mouth daily after supper.     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    Employer: [employer]   Occupation: Quality assurance--cleaning    Employer: [employer]  Occupation: Custodial/funeral assistance    Comment: Financial controller  Tobacco Use   Smoking status: Never   Smokeless tobacco: Never  Vaping Use   Vaping Use: Never used  Substance and Sexual Activity   Alcohol use: No   Drug use: No   Sexual activity: Not on file  Other Topics Concern   Not on file  Social History Narrative   Has living will   Wife has [place] health care POA and alternate is [name] (cousin)   Would accept attempts at resuscitation but no prolonged artificial ventilation   Not sure about a feeding tube   Social Determinants of Health   Financial Resource Strain: Not on file  Food Insecurity: Not on file  Transportation Needs: Not on file  Physical Activity: Not on file   Stress: Not on file  Social Connections: Not on file     Family History: The patient's family history includes Cancer in his father and paternal uncle. There is no history of Colon cancer, Rectal cancer, or Stomach cancer.  ROS:   Please see the history of present illness.     All other systems reviewed and are negative.  EKGs/Labs/Other Studies Reviewed:    The following studies were reviewed today:   EKG:  EKG is ordered today.  Sinus rhythm, occasional atrial paced rhythm.  Recent Labs: 07/27/2020: ALT 20; Hemoglobin 15.3; Platelets 179.0 09/27/2020: BUN 20; Creatinine, Ser 1.49; Potassium 4.1; Sodium 138  Recent Lipid Panel    Component Value Date/Time   CHOL 110 07/27/2020 0925   TRIG 77.0 07/27/2020 0925   HDL 42.30 07/27/2020 0925   CHOLHDL 3 07/27/2020 0925   VLDL 15.4 07/27/2020 0925   LDLCALC 52 07/27/2020 0925    Physical Exam:    VS:  BP (!) 128/91 (BP Location: Left Arm, Patient Position: Sitting, Cuff Size: Normal)   Pulse 76   Ht '5\' 4"'$  (1.626 m)   Wt 182 lb 2 oz (82.6 kg)   SpO2 98%   BMI 31.26 kg/m     Wt Readings from Last 3 Encounters:  07/01/21 182 lb 2 oz (82.6 kg)  02/19/21 185 lb (83.9 kg)  12/21/20 185 lb 9.6 oz (84.2 kg)     GEN:  Well nourished, well developed in no acute distress HEENT: Normal NECK: No JVD; No carotid bruits CARDIAC: RRR, no murmurs, rubs, gallops RESPIRATORY:  Clear to auscultation without rales, wheezing or rhonchi  ABDOMEN: Soft, non-tender, non-distended MUSCULOSKELETAL:  No edema; No deformity, chest tenderness noted on palpation. SKIN: Warm and dry NEUROLOGIC:  Alert and oriented x 3 PSYCHIATRIC:  Normal affect   ASSESSMENT:    1. Tachycardia-bradycardia syndrome ([place])   2. Primary hypertension   3. Dizziness    PLAN:    In order of problems listed above:  tachy bradycardia syndrome s/p permanent pacemaker.  Keep follow-up appointment with device clinic for frequent checks.  Continue  Toprol-XL. History of hypertension, blood pressure controlled.  Elevated BP at home due to stress.  Continue Toprol-XL 25 mg daily, amlodipine 5 mg daily Dizziness with changing position, suggesting positional vertigo.  Orthostatic vitals today with no evidence for orthostasis.  Patient did get dizzy moving from laying to sitting position.  Follow-up with PCP, consider trial of.  Follow-up in 1 year  Total encounter time 35 minutes  Greater than 50% was spent in counseling and coordination of care with the patient   This note was generated in part or whole with voice recognition software.  Voice recognition is usually quite accurate but there are transcription errors that can and very often do occur. I apologize for any typographical errors that were not detected and corrected.  Medication Adjustments/Labs and Tests Ordered: Current medicines are reviewed at length with the patient today.  Concerns regarding medicines are outlined above.  Orders Placed This Encounter  Procedures   EKG 12-Lead     No orders of the defined types were placed in this encounter.     Patient Instructions  Medication Instructions:  Your physician recommends that you continue on your current medications as directed. Please refer to the Current Medication list given to you today.  *If you need a refill on your cardiac medications before your next appointment, please call your pharmacy*   Follow-Up: At [facility], you and your health needs are our priority.  As part of our continuing mission to provide you with exceptional heart care, we have created designated Provider Care Teams.  These Care Teams include your primary Cardiologist (physician) and Advanced Practice Providers (APPs -  Physician Assistants and Nurse Practitioners) who all work together to provide you with the care you need, when you need it.  We recommend signing up for the patient portal called "MyChart".  Sign up information is provided  on this After Visit Summary.  MyChart is used to connect with patients for Virtual Visits (Telemedicine).  Patients are able to view lab/test results, encounter notes, upcoming appointments, etc.  Non-urgent messages can be sent to your provider as well.   To learn more about what you can do with MyChart, go to [email or web address].    Your next appointment:   1 year(s)  The format for your next appointment:   In Person  Provider:   [name], MD    Other Instructions   Important Information About Sugar         Signed, [name], MD  07/01/2021 3:09 PM    [place]

## 2021-07-08 ENCOUNTER — Telehealth: Payer: Self-pay

## 2021-07-08 NOTE — Telephone Encounter (Signed)
Okay I will check him tomorrow

## 2021-07-08 NOTE — Telephone Encounter (Signed)
Per appt note pt already has appt with Dr Alphonsus Sias on 07/09/21 at 7:45. Sending note to Dr Alphonsus Sias and Carollee Herter CMA.

## 2021-07-09 ENCOUNTER — Ambulatory Visit: Payer: Medicare PPO | Admitting: Internal Medicine

## 2021-07-09 ENCOUNTER — Encounter: Payer: Self-pay | Admitting: Internal Medicine

## 2021-07-09 DIAGNOSIS — R42 Dizziness and giddiness: Secondary | ICD-10-CM

## 2021-07-09 NOTE — Progress Notes (Signed)
Subjective:    Patient ID: Bobby Woods, male    DOB: [birth year], 76 y.o.   MRN: [id number]  HPI Here due to vertigo/dizziness  A couple of weeks ago---noticed some dizziness Not bad--"just didn't feel right" Mostly noted when standing up after bending over. No rotatory vertigo--just flushed feeling Not in bed Does note if turning head quick Went to cardiologist---BP was elevated (but having a lot of stress) Went home and it did come down Dr Sandie Ano thought it was vertigo  Yesterday after working at [employer]---he felt his balance off in an instant (was staggering). That has happened before. Resolved quickly (?15 seconds) If bending down--he gets dizzy  No extended symptoms with this (no more than minutes) Did have past vertigo with stroke in the past No meclizine  Current Outpatient Medications on File Prior to Visit  Medication Sig Dispense Refill   clopidogrel (PLAVIX) 75 MG tablet TAKE 1 TABLET BY MOUTH EVERY DAY 90 tablet 3   finasteride (PROSCAR) 5 MG tablet TAKE 1 TABLET BY MOUTH EVERY DAY 90 tablet 3   metoprolol succinate (TOPROL-XL) 25 MG 24 hr tablet TAKE 1 TABLET BY MOUTH EVERY DAY 90 tablet 3   mirabegron ER (MYRBETRIQ) 25 MG TB24 tablet Take 1 tablet (25 mg total) by mouth daily. 30 tablet 11   pantoprazole (PROTONIX) 40 MG tablet TAKE 1 TABLET BY MOUTH EVERY DAY 90 tablet 3   tamsulosin (FLOMAX) 0.4 MG CAPS capsule Take 2 capsules (0.8 mg total) by mouth daily after supper. 180 capsule 3   amLODipine (NORVASC) 5 MG tablet Take 1 tablet (5 mg total) by mouth daily. 30 tablet 10   No current facility-administered medications on file prior to visit.    Allergies  Allergen Reactions   Zithromax [Azithromycin Dihydrate]     GI upset    Past Medical History:  Diagnosis Date   Arthritis    Blood transfusion without reported diagnosis    age 76 year    BPH (benign prostatic hypertrophy)    Chronic kidney disease    GERD (gastroesophageal reflux disease)     Hypertension    on medicines     Past Surgical History:  Procedure Laterality Date   COLONOSCOPY  2006   [facility] SURGERY     INGUINAL HERNIA REPAIR  1965   PACEMAKER IMPLANT N/A 09/16/2019   Procedure: PACEMAKER IMPLANT;  Surgeon: Lanier Prude, MD;  [facility]: [facility];  Service: Cardiovascular;  Laterality: N/A;   PROSTATE BIOPSY  2011   negative   RETINAL DETACHMENT SURGERY  1985   left, right 05/07   ROTATOR CUFF REPAIR  10/09   right   TONSILLECTOMY  1972    Family History  Problem Relation Age of Onset   Cancer Father        prostate   Cancer Paternal Uncle        prostate cancer   Colon cancer Neg Hx    Rectal cancer Neg Hx    Stomach cancer Neg Hx     Social History   Socioeconomic History   Marital status: Married    Spouse name: Not on file   Number of children: 3   Years of education: Not on file   Highest education level: Not on file  Occupational History   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    Employer: [employer]   Occupation: Quality assurance--cleaning    Employer: [employer]    Comment:  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  Tobacco Use   Smoking status: Never   Smokeless tobacco: Never  Vaping Use   Vaping Use: Never used  Substance and Sexual Activity   Alcohol use: No   Drug use: No   Sexual activity: Not on file  Other Topics Concern   Not on file  Social History Narrative   Has living will   Wife has [place] health care POA and alternate is Proofreader (cousin)   Would accept attempts at resuscitation but no prolonged artificial ventilation   Not sure about a feeding tube   Social Determinants of Health   Financial Resource Strain: Not on file  Food Insecurity: Not on file  Transportation Needs: Not on file  Physical Activity: Not on file  Stress: Not on file  Social Connections: Not on file  Intimate Partner Violence: Not on file   Review of Systems No more  problems with his ears Occasional tinnitus Hearing is the same No recent illness No recent travel Is on myrbetriq---for a year. Presumably due to not being able to empty properly (discussed that it could cause retention)     Objective:   Physical Exam Constitutional:      Appearance: Normal appearance.  HENT:     Right Ear: Tympanic membrane and ear canal normal.     Left Ear: Tympanic membrane and ear canal normal.  Eyes:     Comments: EOM full. No nystagmus Right exotropia  Musculoskeletal:     Cervical back: Neck supple.  Lymphadenopathy:     Cervical: No cervical adenopathy.  Neurological:     Mental Status: He is alert and oriented to person, place, and time.     Cranial Nerves: Cranial nerves 2-12 are intact.     Motor: No weakness, tremor or abnormal muscle tone.     Coordination: Coordination is intact. Romberg sign negative.     Gait: Gait is intact.            Assessment & Plan:

## 2021-08-02 ENCOUNTER — Encounter: Payer: Medicare PPO | Admitting: Internal Medicine

## 2021-08-02 ENCOUNTER — Ambulatory Visit (INDEPENDENT_AMBULATORY_CARE_PROVIDER_SITE_OTHER): Payer: Medicare PPO | Admitting: Internal Medicine

## 2021-08-02 ENCOUNTER — Encounter: Payer: Self-pay | Admitting: Internal Medicine

## 2021-08-02 VITALS — BP 124/84 | HR 74 | Temp 97.6°F | Ht 64.5 in | Wt 181.0 lb

## 2021-08-02 DIAGNOSIS — Z0000 Encounter for general adult medical examination without abnormal findings: Secondary | ICD-10-CM

## 2021-08-02 DIAGNOSIS — N401 Enlarged prostate with lower urinary tract symptoms: Secondary | ICD-10-CM

## 2021-08-02 DIAGNOSIS — N1831 Chronic kidney disease, stage 3a: Secondary | ICD-10-CM

## 2021-08-02 DIAGNOSIS — I10 Essential (primary) hypertension: Secondary | ICD-10-CM | POA: Diagnosis not present

## 2021-08-02 DIAGNOSIS — K219 Gastro-esophageal reflux disease without esophagitis: Secondary | ICD-10-CM | POA: Diagnosis not present

## 2021-08-02 DIAGNOSIS — I495 Sick sinus syndrome: Secondary | ICD-10-CM | POA: Diagnosis not present

## 2021-08-02 DIAGNOSIS — N138 Other obstructive and reflux uropathy: Secondary | ICD-10-CM | POA: Diagnosis not present

## 2021-08-02 DIAGNOSIS — Z8673 Personal history of transient ischemic attack (TIA), and cerebral infarction without residual deficits: Secondary | ICD-10-CM | POA: Diagnosis not present

## 2021-08-02 LAB — HEPATIC FUNCTION PANEL
ALT: 19 U/L (ref 0–53)
AST: 22 U/L (ref 0–37)
Albumin: 4.5 g/dL (ref 3.5–5.2)
Alkaline Phosphatase: 51 U/L (ref 39–117)
Bilirubin, Direct: 0.2 mg/dL (ref 0.0–0.3)
Total Bilirubin: 1.1 mg/dL (ref 0.2–1.2)
Total Protein: 6.8 g/dL (ref 6.0–8.3)

## 2021-08-02 LAB — CBC
HCT: 44.8 % (ref 39.0–52.0)
Hemoglobin: 15.1 g/dL (ref 13.0–17.0)
MCHC: 33.8 g/dL (ref 30.0–36.0)
MCV: 93 fl (ref 78.0–100.0)
Platelets: 189 10*3/uL (ref 150.0–400.0)
RBC: 4.82 Mil/uL (ref 4.22–5.81)
RDW: 13 % (ref 11.5–15.5)
WBC: 6 10*3/uL (ref 4.0–10.5)

## 2021-08-02 LAB — RENAL FUNCTION PANEL
Albumin: 4.5 g/dL (ref 3.5–5.2)
BUN: 14 mg/dL (ref 6–23)
CO2: 28 mEq/L (ref 19–32)
Calcium: 9.3 mg/dL (ref 8.4–10.5)
Chloride: 103 mEq/L (ref 96–112)
Creatinine, Ser: 1.31 mg/dL (ref 0.40–1.50)
GFR: 53.16 mL/min — ABNORMAL LOW (ref >60.00)
Glucose, Bld: 98 mg/dL (ref 70–99)
Phosphorus: 2.8 mg/dL (ref 2.3–4.6)
Potassium: 4.2 mEq/L (ref 3.5–5.1)
Sodium: 139 mEq/L (ref 135–145)

## 2021-08-02 LAB — VITAMIN D 25 HYDROXY (VIT D DEFICIENCY, FRACTURES): VITD: 54.75 ng/mL (ref 30.00–100.00)

## 2021-08-02 NOTE — Assessment & Plan Note (Signed)
Will recheck labs 

## 2021-08-02 NOTE — Assessment & Plan Note (Signed)
BP Readings from Last 3 Encounters:  08/02/21 124/84  07/09/21 112/78  07/01/21 (!) 128/91   Amlodipine 5/metoprolol 25

## 2021-08-02 NOTE — Progress Notes (Signed)
Subjective:    Patient ID: Bobby Woods, male    DOB: 01/16/1945, 76 y.o.   MRN: [id number]  HPI Here for Medicare wellness visit and follow up of chronic health conditions Reviewed advanced directives Reviewed other doctors---Dr Agbor-Etang--cardiology, Dr Katheren Shams, Dr Shuan Going, Dr Olivia Mackie, Ms Sandridge--derm No hospitalizations or surgery this year Walks a lot at work--still trying to find a pool he an use. [place] Vision is okay--but can't drive at night due to past stroke Hearing isn't great--has to turn up TV. Hasn't pursued hearing aides No alcohol or tobacco No falls Mild mood issues---wife needs TKR (in wheelchair), daughter may need Achilles surgery----lots of stress. 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, etc  Some trouble with his left foot Was limping yesterday---and leg "gives way" at times No known injury---on top  Now off myrbetriq---BP had been elevated and dizziness This is better now Voids okay Nocturia x 2 on dual therapy  No heartburn as long as he takes medicine No dysphagia  Last GFR 45  No chest pain or SOB Will rarely feel his heart speed up or jump--nothing striking No dizziness or syncope No edema  Current Outpatient Medications on File Prior to Visit  Medication Sig Dispense Refill   clopidogrel (PLAVIX) 75 MG tablet TAKE 1 TABLET BY MOUTH EVERY DAY 90 tablet 3   finasteride (PROSCAR) 5 MG tablet TAKE 1 TABLET BY MOUTH EVERY DAY 90 tablet 3   metoprolol succinate (TOPROL-XL) 25 MG 24 hr tablet TAKE 1 TABLET BY MOUTH EVERY DAY 90 tablet 3   pantoprazole (PROTONIX) 40 MG tablet TAKE 1 TABLET BY MOUTH EVERY DAY 90 tablet 3   tamsulosin (FLOMAX) 0.4 MG CAPS  capsule Take 2 capsules (0.8 mg total) by mouth daily after supper. 180 capsule 3   amLODipine (NORVASC) 5 MG tablet Take 1 tablet (5 mg total) by mouth daily. 30 tablet 10   No current facility-administered medications on file prior to visit.    Allergies  Allergen Reactions   Zithromax [Azithromycin Dihydrate]     GI upset    Past Medical History:  Diagnosis Date   Arthritis    Blood transfusion without reported diagnosis    age 58 year    BPH (benign prostatic hypertrophy)    Chronic kidney disease    GERD (gastroesophageal reflux disease)    Hypertension    on medicines     Past Surgical History:  Procedure Laterality Date   COLONOSCOPY  2006   [place]   PACEMAKER IMPLANT N/A 09/16/2019   Procedure: PACEMAKER IMPLANT;  Surgeon: Vickie Epley, MD;  Location: [facility];  Service: Cardiovascular;  Laterality: N/A;   PROSTATE BIOPSY  2011   negative   RETINAL DETACHMENT SURGERY  1985   left, right 05/07   ROTATOR CUFF REPAIR  10/09   right   TONSILLECTOMY  1972    Family History  Problem Relation Age of Onset   Cancer Father        prostate   Cancer Paternal Uncle        prostate cancer   Colon cancer Neg Hx    Rectal cancer Neg Hx    Stomach cancer  Neg Hx     Social History   Socioeconomic History   Marital status: Married    Spouse name: Not on file   Number of children: 3   Years of education: Not on file   Highest education level: Not on file  Occupational History   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    Employer: [employer]   Occupation: Quality assurance--cleaning    Employer: [employer]    Comment:     Occupation: Custodial/funeral assistance    Comment: Financial controller  Tobacco Use   Smoking status: Never    Passive exposure: Past   Smokeless tobacco: Never  Vaping Use   Vaping Use: Never used  Substance and Sexual Activity   Alcohol use: No   Drug use: No   Sexual  activity: Not on file  Other Topics Concern   Not on file  Social History Narrative   Has living will   Wife has [place] health care POA and alternate is Paramedic (cousin)   Would accept attempts at resuscitation but no prolonged artificial ventilation   Not prolonged feeding tube   Social Determinants of Health   Financial Resource Strain: Not on file  Food Insecurity: Not on file  Transportation Needs: Not on file  Physical Activity: Not on file  Stress: Not on file  Social Connections: Not on file  Intimate Partner Violence: Not on file   Review of Systems Appetite is fine Weight stable Sleeps well Wears seat belt Teeth okay--keeps up with dentist Bowels move fine--no blood Still with spine problems---pain down right leg after sitting No rash now--has had some lesions removed    Objective:   Physical Exam Constitutional:      Appearance: Normal appearance.  HENT:     Mouth/Throat:     Comments: No lesions Eyes:     Conjunctiva/sclera: Conjunctivae normal.     Pupils: Pupils are equal, round, and reactive to light.  Cardiovascular:     Rate and Rhythm: Normal rate and regular rhythm.     Pulses: Normal pulses.     Heart sounds: No murmur heard.    No gallop.  Pulmonary:     Effort: Pulmonary effort is normal.     Breath sounds: Normal breath sounds. No wheezing or rales.  Abdominal:     Palpations: Abdomen is soft.     Tenderness: There is no abdominal tenderness.  Musculoskeletal:     Cervical back: Neck supple.     Right lower leg: No edema.     Left lower leg: No edema.     Comments: Slight tenderness along lateral ankle tendons--discussed diclofenac  Lymphadenopathy:     Cervical: No cervical adenopathy.  Skin:    Findings: No lesion or rash.  Neurological:     General: No focal deficit present.     Mental Status: He is alert and oriented to person, place, and time.     Comments: Mini-cog normal  Psychiatric:        Mood and Affect: Mood normal.         Behavior: Behavior normal.            Assessment & Plan:

## 2021-08-02 NOTE — Assessment & Plan Note (Signed)
Fair control on finasteride 5/tamsulosin 0.4 daly

## 2021-08-02 NOTE — Progress Notes (Signed)
Hearing Screening - Comments:: Did not pass whisper test Vision Screening - Comments:: January 2023

## 2021-08-02 NOTE — Assessment & Plan Note (Signed)
I have personally reviewed the Medicare Annual Wellness questionnaire and have noted 1. The patient's medical and social history 2. Their use of alcohol, tobacco or illicit drugs 3. Their current medications and supplements 4. The patient's functional ability including ADL's, fall risks, home safety risks and hearing or visual             impairment. 5. Diet and physical activities 6. Evidence for depression or mood disorders  The patients weight, height, BMI and visual acuity have been recorded in the chart I have made referrals, counseling and provided education to the patient based review of the above and I have provided the pt with a written personalized care plan for preventive services.  I have provided you with a copy of your personalized plan for preventive services. Please take the time to review along with your updated medication list.  Decided with Dr Selinda Orion more colonoscopies No PSA due to age 76 active with work--and swims when he can shingrix vaccine at pharmacy Updated COVID and flu vaccines in fall

## 2021-08-02 NOTE — Assessment & Plan Note (Signed)
Controlled with pantoprazole '40mg'$  daily

## 2021-08-02 NOTE — Assessment & Plan Note (Signed)
Continues on plavix daily

## 2021-08-02 NOTE — Assessment & Plan Note (Signed)
Rare mild tachy symptoms--metoprolol 25 controls Pacemaker prevents bradycardia

## 2021-08-03 LAB — PARATHYROID HORMONE, INTACT (NO CA): PTH: 44 pg/mL (ref 16–77)

## 2021-08-08 ENCOUNTER — Telehealth: Payer: Self-pay

## 2021-08-08 NOTE — Telephone Encounter (Signed)
I left a message for the patient to return my call.

## 2021-08-08 NOTE — Telephone Encounter (Signed)
-----   Message from Venia Carbon, MD sent at 08/04/2021  1:03 PM EDT ----- Please send copy Your labs look fine The kidney function (creatinine and GFR) have actually improved Everything else is normal This is a good report!

## 2021-08-09 NOTE — Telephone Encounter (Signed)
Patient called to return a call. Call back number [phone].

## 2021-08-09 NOTE — Telephone Encounter (Signed)
Patient called back and I made him aware of Dr Everardo Beals message about results, he didn't have any further questions

## 2021-08-09 NOTE — Telephone Encounter (Signed)
Awesome! Thank you!

## 2021-09-13 ENCOUNTER — Ambulatory Visit (INDEPENDENT_AMBULATORY_CARE_PROVIDER_SITE_OTHER): Payer: Medicare PPO

## 2021-09-13 DIAGNOSIS — I495 Sick sinus syndrome: Secondary | ICD-10-CM

## 2021-09-17 LAB — CUP PACEART REMOTE DEVICE CHECK
Battery Remaining Longevity: 136 mo
Battery Voltage: 3.02 V
Brady Statistic AP VP Percent: 0.05 %
Brady Statistic AP VS Percent: 86.22 %
Brady Statistic AS VP Percent: 0 %
Brady Statistic AS VS Percent: 13.73 %
Brady Statistic RA Percent Paced: 86.32 %
Brady Statistic RV Percent Paced: 0.06 %
Date Time Interrogation Session: 20230901042518
Implantable Lead Implant Date: 20210903
Implantable Lead Implant Date: 20210903
Implantable Lead Location: 753859
Implantable Lead Location: 753860
Implantable Lead Model: 5076
Implantable Lead Model: 5076
Implantable Pulse Generator Implant Date: 20210903
Lead Channel Impedance Value: 285 Ohm
Lead Channel Impedance Value: 323 Ohm
Lead Channel Impedance Value: 342 Ohm
Lead Channel Impedance Value: 380 Ohm
Lead Channel Pacing Threshold Amplitude: 0.5 V
Lead Channel Pacing Threshold Amplitude: 0.75 V
Lead Channel Pacing Threshold Pulse Width: 0.4 ms
Lead Channel Pacing Threshold Pulse Width: 0.4 ms
Lead Channel Sensing Intrinsic Amplitude: 1.875 mV
Lead Channel Sensing Intrinsic Amplitude: 1.875 mV
Lead Channel Sensing Intrinsic Amplitude: 7 mV
Lead Channel Sensing Intrinsic Amplitude: 7 mV
Lead Channel Setting Pacing Amplitude: 1.5 V
Lead Channel Setting Pacing Amplitude: 2 V
Lead Channel Setting Pacing Pulse Width: 0.4 ms
Lead Channel Setting Sensing Sensitivity: 1.2 mV

## 2021-09-28 ENCOUNTER — Other Ambulatory Visit: Payer: Self-pay | Admitting: Internal Medicine

## 2021-09-30 NOTE — Progress Notes (Unsigned)
Electrophysiology Office Follow up Visit Note:    Date:  10/02/2021   ID:  Bobby Woods, DOB 19-Nov-1945, MRN [id number]  PCP:  Venia Carbon, MD  [facility] Cardiologist:  [name], MD  [facility] Electrophysiologist:  Vickie Epley, MD    Interval History:    Bobby Woods is a 76 y.o. male who presents for a follow up visit. They were last seen in clinic 09/26/2020 for tachy-brady. He has a PPM in situ.  Remote interrogations since our last appointment have shown stable device function.  The patient has been doing well since I last saw him.  No problems with the pacemaker.      Past Medical History:  Diagnosis Date   Arthritis    Blood transfusion without reported diagnosis    age 57 year    BPH (benign prostatic hypertrophy)    Chronic kidney disease    GERD (gastroesophageal reflux disease)    Hypertension    on medicines     Past Surgical History:  Procedure Laterality Date   COLONOSCOPY  2006   [place]   PACEMAKER IMPLANT N/A 09/16/2019   Procedure: PACEMAKER IMPLANT;  Surgeon: Vickie Epley, MD;  Location: [place] [facility];  Service: Cardiovascular;  Laterality: N/A;   PROSTATE BIOPSY  2011   negative   RETINAL DETACHMENT SURGERY  1985   left, right 05/07   ROTATOR CUFF REPAIR  10/09   right   TONSILLECTOMY  1972    Current Medications: Current Meds  Medication Sig   amLODipine (NORVASC) 5 MG tablet Take 1 tablet (5 mg total) by mouth daily.   clopidogrel (PLAVIX) 75 MG tablet TAKE 1 TABLET BY MOUTH EVERY DAY   finasteride (PROSCAR) 5 MG tablet TAKE 1 TABLET BY MOUTH EVERY DAY   metoprolol succinate (TOPROL-XL) 25 MG 24 hr tablet TAKE 1 TABLET BY MOUTH EVERY DAY   pantoprazole (PROTONIX) 40 MG tablet TAKE 1 TABLET BY MOUTH EVERY DAY   tamsulosin (FLOMAX) 0.4 MG CAPS capsule Take 2 capsules (0.8 mg total) by mouth daily after supper.     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    Employer: [employer]   Occupation: Quality assurance--cleaning    Employer: [employer]    Comment:     Occupation: Custodial/funeral assistance    Comment: Financial controller  Tobacco Use   Smoking status: Never    Passive exposure: Past   Smokeless tobacco: Never  Vaping Use   Vaping Use: Never used  Substance and Sexual Activity   Alcohol use: No   Drug use: No   Sexual activity: Not on file  Other Topics Concern   Not on file  Social History Narrative   Has living will   Wife has [place] health care POA and alternate is Paramedic (cousin)   Would accept attempts at resuscitation but no prolonged artificial ventilation   Not prolonged feeding tube   Social Determinants of Health   Financial Resource Strain: Not on file  Food Insecurity: Not on file  Transportation Needs: Not on file  Physical  Activity: Not on file  Stress: Not on file  Social Connections: Not on file     Family History: The patient's family history includes Cancer in his father and paternal uncle. There is no history of Colon cancer, Rectal cancer, or Stomach cancer.  ROS:   Please see the history of present illness.    All other systems reviewed and are negative.  EKGs/Labs/Other Studies Reviewed:    The following studies were reviewed today:  10/02/2021 in clinic device interrogation personally reviewed Battery longevity 11.3 years Lead parameters stable No changes made to programming today   Recent Labs: 08/02/2021: ALT 19; BUN 14; Creatinine, Ser 1.31; Hemoglobin 15.1; Platelets 189.0; Potassium 4.2; Sodium 139  Recent Lipid Panel    Component Value Date/Time   CHOL 110 07/27/2020 0925   TRIG 77.0  07/27/2020 0925   HDL 42.30 07/27/2020 0925   CHOLHDL 3 07/27/2020 0925   VLDL 15.4 07/27/2020 0925   LDLCALC 52 07/27/2020 0925    Physical Exam:    VS:  BP 120/84   Pulse 72   Ht '5\' 4"'$  (1.626 m)   Wt 179 lb (81.2 kg)   BMI 30.73 kg/m     Wt Readings from Last 3 Encounters:  10/02/21 179 lb (81.2 kg)  08/02/21 181 lb (82.1 kg)  07/09/21 183 lb (83 kg)     GEN:  Well nourished, well developed in no acute distress HEENT: Normal NECK: No JVD; No carotid bruits LYMPHATICS: No lymphadenopathy CARDIAC: RRR, no murmurs, rubs, gallops.  Pacemaker pocket well-healed RESPIRATORY:  Clear to auscultation without rales, wheezing or rhonchi  ABDOMEN: Soft, non-tender, non-distended MUSCULOSKELETAL:  No edema; No deformity  SKIN: Warm and dry NEUROLOGIC:  Alert and oriented x 3 PSYCHIATRIC:  Normal affect        ASSESSMENT:    1. Tachycardia-bradycardia syndrome ([place])   2. Primary hypertension   3. Pacemaker    PLAN:    In order of problems listed above:  #Tachybrady syndrome #PPM in situ Device functioning appropriately. Continue remote monitoring.  #Hypertension At goal today.  Recommend checking blood pressures 1-2 times per week at home and recording the values.  Recommend bringing these recordings to the primary care physician.   F/u 1 year with APP.   Medication Adjustments/Labs and Tests Ordered: Current medicines are reviewed at length with the patient today.  Concerns regarding medicines are outlined above.  No orders of the defined types were placed in this encounter.  No orders of the defined types were placed in this encounter.    Signed, Lars Mage, MD, [facility], [facility] 10/02/2021 2:02 PM    Electrophysiology [facility]

## 2021-10-02 ENCOUNTER — Ambulatory Visit: Payer: Medicare PPO | Attending: Cardiology | Admitting: Cardiology

## 2021-10-02 ENCOUNTER — Encounter: Payer: Self-pay | Admitting: Cardiology

## 2021-10-02 VITALS — BP 120/84 | HR 72 | Ht 64.0 in | Wt 179.0 lb

## 2021-10-02 DIAGNOSIS — I495 Sick sinus syndrome: Secondary | ICD-10-CM | POA: Diagnosis not present

## 2021-10-02 DIAGNOSIS — I10 Essential (primary) hypertension: Secondary | ICD-10-CM | POA: Diagnosis not present

## 2021-10-02 DIAGNOSIS — Z950 Presence of cardiac pacemaker: Secondary | ICD-10-CM | POA: Diagnosis not present

## 2021-10-02 NOTE — Patient Instructions (Signed)
Medication Instructions:  none *If you need a refill on your cardiac medications before your next appointment, please call your pharmacy*   Lab Work: none If you have labs (blood work) drawn today and your tests are completely normal, you will receive your results only by: [place] (if you have MyChart) OR A paper copy in the mail If you have any lab test that is abnormal or we need to change your treatment, we will call you to review the results.   Testing/Procedures: none   Follow-Up: At [facility], you and your health needs are our priority.  As part of our continuing mission to provide you with exceptional heart care, we have created designated Provider Care Teams.  These Care Teams include your primary Cardiologist (physician) and Advanced Practice Providers (APPs -  Physician Assistants and Nurse Practitioners) who all work together to provide you with the care you need, when you need it.  We recommend signing up for the patient portal called "MyChart".  Sign up information is provided on this After Visit Summary.  MyChart is used to connect with patients for Virtual Visits (Telemedicine).  Patients are able to view lab/test results, encounter notes, upcoming appointments, etc.  Non-urgent messages can be sent to your provider as well.   To learn more about what you can do with MyChart, go to [email or web address].    Your next appointment:   1 year(s)  The format for your next appointment:   In Person  Provider:   You will see one of the following Advanced Practice Providers on your designated Care Team:   Murray Hodgkins, NP Christell Faith, PA-C Cadence Kathlen Mody, PA-C Gerrie Nordmann, NP      Other Instructions none  Important Information About Sugar

## 2021-10-02 NOTE — Progress Notes (Signed)
Remote pacemaker transmission.   

## 2021-10-07 ENCOUNTER — Ambulatory Visit: Payer: Medicare PPO | Admitting: Internal Medicine

## 2021-10-07 ENCOUNTER — Encounter: Payer: Self-pay | Admitting: Internal Medicine

## 2021-10-07 VITALS — BP 120/82 | HR 80 | Temp 100.1°F | Ht 64.0 in | Wt 180.0 lb

## 2021-10-07 DIAGNOSIS — U071 COVID-19: Secondary | ICD-10-CM | POA: Diagnosis not present

## 2021-10-07 DIAGNOSIS — R509 Fever, unspecified: Secondary | ICD-10-CM

## 2021-10-07 LAB — POC INFLUENZA A&B (BINAX/QUICKVUE)
Influenza A, POC: NEGATIVE
Influenza B, POC: NEGATIVE

## 2021-10-07 LAB — POC COVID19 BINAXNOW: SARS Coronavirus 2 Ag: POSITIVE — AB

## 2021-10-07 MED ORDER — MOLNUPIRAVIR 200 MG PO CAPS: 4.0000 | ORAL_CAPSULE | Freq: Two times a day (BID) | ORAL | 0 refills | Status: DC

## 2021-10-07 NOTE — Assessment & Plan Note (Signed)
Fairly mild symptoms 2 days in Discussed tylenol, OTC cough meds Will give molnupiravir (drug interactions and decreased GFR) To ER if sig SOB OOW till 10/2 at least --return then only if feels back to normal

## 2021-10-07 NOTE — Progress Notes (Signed)
Subjective:    Patient ID: Bobby Woods, male    DOB: 06/08/1945, 76 y.o.   MRN: [id number]  HPI Here due to respiratory illness  Sick for 2 days Cough and feeling bad Running in his eyes Blowing nose--clear stuff May have had fever yesterday No SOB No ear pain  No meds for this  Current Outpatient Medications on File Prior to Visit  Medication Sig Dispense Refill   clopidogrel (PLAVIX) 75 MG tablet TAKE 1 TABLET BY MOUTH EVERY DAY 90 tablet 3   finasteride (PROSCAR) 5 MG tablet TAKE 1 TABLET BY MOUTH EVERY DAY 90 tablet 3   metoprolol succinate (TOPROL-XL) 25 MG 24 hr tablet TAKE 1 TABLET BY MOUTH EVERY DAY 90 tablet 3   pantoprazole (PROTONIX) 40 MG tablet TAKE 1 TABLET BY MOUTH EVERY DAY 90 tablet 3   tamsulosin (FLOMAX) 0.4 MG CAPS capsule Take 2 capsules (0.8 mg total) by mouth daily after supper. 180 capsule 3   amLODipine (NORVASC) 5 MG tablet Take 1 tablet (5 mg total) by mouth daily. 30 tablet 10   No current facility-administered medications on file prior to visit.    Allergies  Allergen Reactions   Zithromax [Azithromycin Dihydrate]     GI upset    Past Medical History:  Diagnosis Date   Arthritis    Blood transfusion without reported diagnosis    age 7 year    BPH (benign prostatic hypertrophy)    Chronic kidney disease    GERD (gastroesophageal reflux disease)    Hypertension    on medicines     Past Surgical History:  Procedure Laterality Date   COLONOSCOPY  2006   [place]   PACEMAKER IMPLANT N/A 09/16/2019   Procedure: PACEMAKER IMPLANT;  Surgeon: Vickie Epley, MD;  Location: [place] [facility];  Service: Cardiovascular;  Laterality: N/A;   PROSTATE BIOPSY  2011   negative   RETINAL DETACHMENT SURGERY  1985   left, right 05/07   ROTATOR CUFF REPAIR  10/09   right   TONSILLECTOMY  1972    Family History  Problem Relation Age of Onset   Cancer Father        prostate   Cancer Paternal Uncle         prostate cancer   Colon cancer Neg Hx    Rectal cancer Neg Hx    Stomach cancer Neg Hx     Social History   Socioeconomic History   Marital status: Married    Spouse name: Not on file   Number of children: 3   Years of education: Not on file   Highest education level: Not on file  Occupational History   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    Employer: [employer]   Occupation: Quality assurance--cleaning    Employer: [employer]    Comment:     Occupation: Custodial/funeral assistance    Comment: [employer]  Tobacco Use   Smoking status: Never    Passive exposure: Past   Smokeless tobacco: Never  Vaping Use   Vaping Use: Never used  Substance and Sexual Activity   Alcohol use: No   Drug use: No   Sexual activity: Not on file  Other Topics Concern   Not on file  Social History Narrative   Has living will   Wife has [place] health care POA and alternate is [name] (cousin)   Would  accept attempts at resuscitation but no prolonged artificial ventilation   Not prolonged feeding tube   Social Determinants of Health   Financial Resource Strain: Not on file  Food Insecurity: Not on file  Transportation Needs: Not on file  Physical Activity: Not on file  Stress: Not on file  Social Connections: Not on file  Intimate Partner Violence: Not on file   Review of Systems No N/V Eating okay Wife not sick thus far    Objective:   Physical Exam Constitutional:      Appearance: Normal appearance.  HENT:     Head:     Comments: No sinus tenderness    Right Ear: Tympanic membrane and ear canal normal.     Left Ear: Tympanic membrane and ear canal normal.     Nose:     Comments: Mild congestion    Mouth/Throat:     Pharynx: No oropharyngeal exudate or posterior oropharyngeal erythema.  Pulmonary:     Effort: Pulmonary effort is normal.     Breath sounds: Normal breath sounds. No wheezing or rales.  Musculoskeletal:     Cervical back: Neck  supple.  Lymphadenopathy:     Cervical: No cervical adenopathy.  Neurological:     Mental Status: He is alert.            Assessment & Plan:

## 2021-11-12 ENCOUNTER — Other Ambulatory Visit: Payer: Self-pay | Admitting: Internal Medicine

## 2021-11-15 ENCOUNTER — Ambulatory Visit: Payer: Medicare PPO | Admitting: Urology

## 2021-12-04 ENCOUNTER — Other Ambulatory Visit: Payer: Self-pay | Admitting: Internal Medicine

## 2021-12-13 ENCOUNTER — Ambulatory Visit (INDEPENDENT_AMBULATORY_CARE_PROVIDER_SITE_OTHER): Payer: Medicare PPO

## 2021-12-13 DIAGNOSIS — I495 Sick sinus syndrome: Secondary | ICD-10-CM

## 2021-12-14 LAB — CUP PACEART REMOTE DEVICE CHECK
Battery Remaining Longevity: 133 mo
Battery Voltage: 3.02 V
Brady Statistic AP VP Percent: 0.05 %
Brady Statistic AP VS Percent: 84.13 %
Brady Statistic AS VP Percent: 0.01 %
Brady Statistic AS VS Percent: 15.81 %
Brady Statistic RA Percent Paced: 84.26 %
Brady Statistic RV Percent Paced: 0.06 %
Date Time Interrogation Session: 20231130214921
Implantable Lead Connection Status: 753985
Implantable Lead Connection Status: 753985
Implantable Lead Implant Date: 20210903
Implantable Lead Implant Date: 20210903
Implantable Lead Location: 753859
Implantable Lead Location: 753860
Implantable Lead Model: 5076
Implantable Lead Model: 5076
Implantable Pulse Generator Implant Date: 20210903
Lead Channel Impedance Value: 285 Ohm
Lead Channel Impedance Value: 323 Ohm
Lead Channel Impedance Value: 342 Ohm
Lead Channel Impedance Value: 380 Ohm
Lead Channel Pacing Threshold Amplitude: 0.5 V
Lead Channel Pacing Threshold Amplitude: 0.5 V
Lead Channel Pacing Threshold Pulse Width: 0.4 ms
Lead Channel Pacing Threshold Pulse Width: 0.4 ms
Lead Channel Sensing Intrinsic Amplitude: 1.375 mV
Lead Channel Sensing Intrinsic Amplitude: 1.375 mV
Lead Channel Sensing Intrinsic Amplitude: 6.125 mV
Lead Channel Sensing Intrinsic Amplitude: 6.125 mV
Lead Channel Setting Pacing Amplitude: 1.5 V
Lead Channel Setting Pacing Amplitude: 2 V
Lead Channel Setting Pacing Pulse Width: 0.4 ms
Lead Channel Setting Sensing Sensitivity: 1.2 mV
Zone Setting Status: 755011

## 2021-12-26 ENCOUNTER — Other Ambulatory Visit: Payer: Self-pay | Admitting: Internal Medicine

## 2021-12-31 NOTE — Progress Notes (Signed)
Remote pacemaker transmission.   

## 2022-01-29 ENCOUNTER — Other Ambulatory Visit: Payer: Self-pay

## 2022-01-29 MED ORDER — AMLODIPINE BESYLATE 5 MG PO TABS: 5.0000 mg | ORAL_TABLET | Freq: Every day | ORAL | 3 refills | Status: DC

## 2022-01-31 ENCOUNTER — Ambulatory Visit (INDEPENDENT_AMBULATORY_CARE_PROVIDER_SITE_OTHER): Payer: Medicare HMO | Admitting: Nurse Practitioner

## 2022-01-31 VITALS — BP 128/82 | HR 68 | Ht 64.0 in | Wt 182.0 lb

## 2022-01-31 DIAGNOSIS — M545 Low back pain, unspecified: Secondary | ICD-10-CM

## 2022-01-31 DIAGNOSIS — R350 Frequency of micturition: Secondary | ICD-10-CM | POA: Diagnosis not present

## 2022-01-31 LAB — POC URINALSYSI DIPSTICK (AUTOMATED)
Bilirubin, UA: NEGATIVE
Blood, UA: NEGATIVE
Glucose, UA: NEGATIVE
Ketones, UA: NEGATIVE
Leukocytes, UA: NEGATIVE
Nitrite, UA: NEGATIVE
Protein, UA: NEGATIVE
Spec Grav, UA: 1.02 (ref 1.010–1.025)
Urobilinogen, UA: 0.2 E.U./dL
pH, UA: 6 (ref 5.0–8.0)

## 2022-01-31 MED ORDER — TIZANIDINE HCL 2 MG PO CAPS: 2.0000 mg | ORAL_CAPSULE | Freq: Every evening | ORAL | 0 refills | Status: DC | PRN

## 2022-01-31 NOTE — Assessment & Plan Note (Signed)
Seems MSK in nature.  No known injury but does work at [employer] and lifts things.  Worse with range of motion.  Patient continues in Tylenol as is beneficial and on Voltaren gel if needed tizanidine 2 mg nightly as needed.  Sedation precautions reviewed with patient

## 2022-01-31 NOTE — Progress Notes (Signed)
Acute Office Visit  Subjective:     Patient ID: Bobby Woods, male    DOB: 12/10/1945, 77 y.o.   MRN: [id number]  Chief Complaint  Patient presents with   Flank Pain    Right side, x3d, denies hematuria, no hx of kidney stones, does have CKD3, enlarged prostate; denies f/n/v/abdpain   Urinary Frequency    X3d,      Patient is in today for flank pain with a history of HTN, GERD, CKD, Pacemaker, CVA, BPH  Symptoms started approx 3 days ago Wednesday States that Wednesday evening he got sore on his right lower back. States that he was not able to sleep du e to pain. States that he had to get up to take tylenol. Had to do that again last night and that help. States that the pain was constant. History of back trouble  States that the area is sore right now and no movement makes it worse. Nothing makes it better   Review of Systems  Constitutional:  Negative for chills and fever.  Respiratory:  Negative for shortness of breath.   Cardiovascular:  Negative for chest pain.  Gastrointestinal:  Negative for abdominal pain, diarrhea, nausea and vomiting.       BM today that was normal   Genitourinary:  Positive for flank pain and frequency. Negative for dysuria and hematuria.        Objective:    BP 128/82   Pulse 68   Ht '5\' 4"'$  (1.626 m)   Wt 182 lb (82.6 kg)   SpO2 92%   BMI 31.24 kg/m    Physical Exam Vitals and nursing note reviewed.  Constitutional:      Appearance: Normal appearance. He is obese.  Cardiovascular:     Rate and Rhythm: Normal rate and regular rhythm.     Heart sounds: Normal heart sounds.  Pulmonary:     Effort: Pulmonary effort is normal.     Breath sounds: Normal breath sounds.  Abdominal:     General: Bowel sounds are normal. There is no distension.     Palpations: There is no mass.     Tenderness: There is abdominal tenderness. There is right CVA tenderness. There is no left CVA tenderness.     Hernia: No hernia is present.        Comments: Described as pressure  Musculoskeletal:       Arms:     Lumbar back: No bony tenderness. Positive left straight leg raise test. Negative right straight leg raise test.     Comments: Increased discomfort with lateral ROM   Neurological:     Mental Status: He is alert.     Results for orders placed or performed in visit on 01/31/22  POCT Urinalysis Dipstick (Automated)  Result Value Ref Range   Color, UA yellow    Clarity, UA clear    Glucose, UA Negative Negative   Bilirubin, UA neg    Ketones, UA neg    Spec Grav, UA 1.020 1.010 - 1.025   Blood, UA neg    pH, UA 6.0 5.0 - 8.0   Protein, UA Negative Negative   Urobilinogen, UA 0.2 0.2 or 1.0 E.U./dL   Nitrite, UA neg    Leukocytes, UA Negative Negative        Assessment & Plan:   Problem List Items Addressed This Visit       Other   Lower back pain    Seems MSK in nature.  No  known injury but does work at [employer] and lifts things.  Worse with range of motion.  Patient continues in Tylenol as is beneficial and on Voltaren gel if needed tizanidine 2 mg nightly as needed.  Sedation precautions reviewed with patient      Relevant Medications   tizanidine (ZANAFLEX) 2 MG capsule   Urinary frequency - Primary    UA was negative in office.  Will send off cultures as he is having urinary frequency and some CVA tenderness on exam      Relevant Orders   POCT Urinalysis Dipstick (Automated) (Completed)    Meds ordered this encounter  Medications   tizanidine (ZANAFLEX) 2 MG capsule    Sig: Take 1 capsule (2 mg total) by mouth at bedtime as needed for muscle spasms.    Dispense:  10 capsule    Refill:  0    Order Specific Question:   Supervising Provider    Answer:   TOWER, MARNE A [[id number]]    Return if symptoms worsen or fail to improve.  Romilda Garret, NP

## 2022-01-31 NOTE — Assessment & Plan Note (Signed)
UA was negative in office.  Will send off cultures as he is having urinary frequency and some CVA tenderness on exam

## 2022-01-31 NOTE — Patient Instructions (Signed)
Continue using tylenol as needed Use some of the voltaren gel on the back I have sent in muscle relaxer. It can make you sleepy and dizzy so use caution Follow up if no improvement

## 2022-02-03 DIAGNOSIS — M545 Low back pain, unspecified: Secondary | ICD-10-CM | POA: Diagnosis not present

## 2022-02-03 DIAGNOSIS — R350 Frequency of micturition: Secondary | ICD-10-CM | POA: Diagnosis not present

## 2022-02-03 NOTE — Addendum Note (Signed)
Addended by: Michela Pitcher on: 02/03/2022 09:14 AM   Modules accepted: Orders

## 2022-02-04 LAB — URINE CULTURE
MICRO NUMBER:: 14455118
Result:: NO GROWTH
SPECIMEN QUALITY:: ADEQUATE

## 2022-02-17 DIAGNOSIS — D2272 Melanocytic nevi of left lower limb, including hip: Secondary | ICD-10-CM | POA: Diagnosis not present

## 2022-02-17 DIAGNOSIS — L570 Actinic keratosis: Secondary | ICD-10-CM | POA: Diagnosis not present

## 2022-02-17 DIAGNOSIS — D2262 Melanocytic nevi of left upper limb, including shoulder: Secondary | ICD-10-CM | POA: Diagnosis not present

## 2022-02-17 DIAGNOSIS — D044 Carcinoma in situ of skin of scalp and neck: Secondary | ICD-10-CM | POA: Diagnosis not present

## 2022-02-17 DIAGNOSIS — Z85828 Personal history of other malignant neoplasm of skin: Secondary | ICD-10-CM | POA: Diagnosis not present

## 2022-02-17 DIAGNOSIS — X32XXXA Exposure to sunlight, initial encounter: Secondary | ICD-10-CM | POA: Diagnosis not present

## 2022-02-17 DIAGNOSIS — D485 Neoplasm of uncertain behavior of skin: Secondary | ICD-10-CM | POA: Diagnosis not present

## 2022-02-17 DIAGNOSIS — D2261 Melanocytic nevi of right upper limb, including shoulder: Secondary | ICD-10-CM | POA: Diagnosis not present

## 2022-02-25 DIAGNOSIS — D044 Carcinoma in situ of skin of scalp and neck: Secondary | ICD-10-CM | POA: Diagnosis not present

## 2022-03-05 DIAGNOSIS — L570 Actinic keratosis: Secondary | ICD-10-CM | POA: Diagnosis not present

## 2022-03-05 DIAGNOSIS — L578 Other skin changes due to chronic exposure to nonionizing radiation: Secondary | ICD-10-CM | POA: Diagnosis not present

## 2022-03-05 DIAGNOSIS — C44212 Basal cell carcinoma of skin of right ear and external auricular canal: Secondary | ICD-10-CM | POA: Diagnosis not present

## 2022-03-05 DIAGNOSIS — L814 Other melanin hyperpigmentation: Secondary | ICD-10-CM | POA: Diagnosis not present

## 2022-03-05 DIAGNOSIS — Z85828 Personal history of other malignant neoplasm of skin: Secondary | ICD-10-CM | POA: Diagnosis not present

## 2022-03-09 ENCOUNTER — Other Ambulatory Visit: Payer: Self-pay | Admitting: Internal Medicine

## 2022-03-14 ENCOUNTER — Ambulatory Visit: Payer: Medicare HMO

## 2022-03-14 DIAGNOSIS — I495 Sick sinus syndrome: Secondary | ICD-10-CM | POA: Diagnosis not present

## 2022-03-17 LAB — CUP PACEART REMOTE DEVICE CHECK
Battery Remaining Longevity: 129 mo
Battery Voltage: 3.02 V
Brady Statistic AP VP Percent: 0.06 %
Brady Statistic AP VS Percent: 85.94 %
Brady Statistic AS VP Percent: 0.01 %
Brady Statistic AS VS Percent: 14 %
Brady Statistic RA Percent Paced: 86.05 %
Brady Statistic RV Percent Paced: 0.06 %
Date Time Interrogation Session: 20240229213629
Implantable Lead Connection Status: 753985
Implantable Lead Connection Status: 753985
Implantable Lead Implant Date: 20210903
Implantable Lead Implant Date: 20210903
Implantable Lead Location: 753859
Implantable Lead Location: 753860
Implantable Lead Model: 5076
Implantable Lead Model: 5076
Implantable Pulse Generator Implant Date: 20210903
Lead Channel Impedance Value: 266 Ohm
Lead Channel Impedance Value: 304 Ohm
Lead Channel Impedance Value: 323 Ohm
Lead Channel Impedance Value: 361 Ohm
Lead Channel Pacing Threshold Amplitude: 0.5 V
Lead Channel Pacing Threshold Amplitude: 0.625 V
Lead Channel Pacing Threshold Pulse Width: 0.4 ms
Lead Channel Pacing Threshold Pulse Width: 0.4 ms
Lead Channel Sensing Intrinsic Amplitude: 0.5 mV
Lead Channel Sensing Intrinsic Amplitude: 0.5 mV
Lead Channel Sensing Intrinsic Amplitude: 5.5 mV
Lead Channel Sensing Intrinsic Amplitude: 5.5 mV
Lead Channel Setting Pacing Amplitude: 1.5 V
Lead Channel Setting Pacing Amplitude: 2 V
Lead Channel Setting Pacing Pulse Width: 0.4 ms
Lead Channel Setting Sensing Sensitivity: 1.2 mV
Zone Setting Status: 755011

## 2022-04-08 DIAGNOSIS — H4423 Degenerative myopia, bilateral: Secondary | ICD-10-CM | POA: Diagnosis not present

## 2022-04-08 DIAGNOSIS — Z8669 Personal history of other diseases of the nervous system and sense organs: Secondary | ICD-10-CM | POA: Diagnosis not present

## 2022-04-08 DIAGNOSIS — H2703 Aphakia, bilateral: Secondary | ICD-10-CM | POA: Diagnosis not present

## 2022-04-08 DIAGNOSIS — Z0100 Encounter for examination of eyes and vision without abnormal findings: Secondary | ICD-10-CM | POA: Diagnosis not present

## 2022-04-11 ENCOUNTER — Ambulatory Visit
Admission: EM | Admit: 2022-04-11 | Discharge: 2022-04-11 | Disposition: A | Discharge status: Home / Self Care | Payer: Medicare HMO | Attending: Urgent Care | Admitting: Urgent Care

## 2022-04-11 ENCOUNTER — Ambulatory Visit: Payer: Self-pay

## 2022-04-11 DIAGNOSIS — J069 Acute upper respiratory infection, unspecified: Secondary | ICD-10-CM | POA: Diagnosis not present

## 2022-04-11 MED ORDER — BENZONATATE 100 MG PO CAPS: ORAL_CAPSULE | ORAL | 0 refills | Status: DC

## 2022-04-11 NOTE — Progress Notes (Signed)
Remote pacemaker transmission.   

## 2022-04-11 NOTE — Discharge Instructions (Signed)
Follow up here or with your primary care provider if your symptoms are worsening or not improving.     

## 2022-04-11 NOTE — ED Triage Notes (Signed)
Cough, sneezing, runny nose, that started Monday. Taking robitussin with no relief of symptoms.

## 2022-04-11 NOTE — ED Provider Notes (Signed)
Bobby Woods    CSN: [id number] Arrival date & time: 04/11/22  1733      History   Chief Complaint Chief Complaint  Patient presents with   Cough    HPI Bobby Woods is a 77 y.o. male.    Cough   Presents to urgent care with symptoms starting 4 days ago including cough, sneezing, runny nose.  PMH includes chronic renal disease, stage III.  Cough is worse at night and keeping him awake.  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 11/29/2020   Urinary frequency 09/27/2020   Tachycardia-bradycardia syndrome ([place]) 12/21/2019   Pacemaker 12/21/2019   Falling 11/14/2019   Fatigue 11/14/2019   Imbalance 11/14/2019   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   PACEMAKER IMPLANT N/A 09/16/2019   Procedure: PACEMAKER IMPLANT;  Surgeon: Vickie Epley, MD;  Location: [place] [facility];  Service: Cardiovascular;  Laterality: N/A;   PROSTATE BIOPSY  2011   negative   RETINAL DETACHMENT  SURGERY  1985   left, right 05/07   ROTATOR CUFF REPAIR  10/09   right   TONSILLECTOMY  1972       Home Medications    Prior to Admission medications   Medication Sig Start Date End Date Taking? Authorizing Provider  amLODipine (NORVASC) 5 MG tablet Take 1 tablet (5 mg total) by mouth daily. 01/29/22 01/24/23 Yes Agbor-Etang, Aaron Edelman, MD  clopidogrel (PLAVIX) 75 MG tablet TAKE 1 TABLET BY MOUTH EVERY DAY 12/27/21  Yes Viviana Simpler I, MD  finasteride (PROSCAR) 5 MG tablet TAKE 1 TABLET BY MOUTH EVERY DAY 12/04/21  Yes Viviana Simpler I, MD  metoprolol succinate (TOPROL-XL) 25 MG 24 hr tablet TAKE 1 TABLET BY MOUTH EVERY DAY 03/10/22  Yes Viviana Simpler I, MD  pantoprazole (PROTONIX) 40 MG tablet TAKE 1 TABLET BY MOUTH EVERY DAY 09/30/21  Yes Venia Carbon, MD  tamsulosin (FLOMAX) 0.4 MG CAPS capsule TAKE 2 CAPSULES (0.8 MG TOTAL) BY MOUTH DAILY AFTER SUPPER. 11/12/21  Yes Venia Carbon, MD  tizanidine (ZANAFLEX) 2 MG capsule Take 1 capsule (2 mg total) by mouth at bedtime as needed for muscle spasms. 01/31/22  Yes Michela Pitcher, NP    Family History Family History  Problem Relation Age of Onset   Cancer Father        prostate   Cancer Paternal Uncle        prostate cancer   Colon  cancer Neg Hx    Rectal cancer Neg Hx    Stomach cancer Neg Hx     Social History Social History   Tobacco Use   Smoking status: Never    Passive exposure: Past   Smokeless tobacco: Never  Vaping Use   Vaping Use: Never used  Substance Use Topics   Alcohol use: No   Drug use: No     Allergies   Zithromax [azithromycin dihydrate]   Review of Systems Review of Systems  Respiratory:  Positive for cough.      Physical Exam Triage Vital Signs ED Triage Vitals  Enc Vitals Group     BP 04/11/22 1749 130/77     Pulse Rate 04/11/22 1749 80     Resp 04/11/22 1749 18     Temp 04/11/22 1749 98.7 F (37.1 C)     Temp Source 04/11/22 1749 Oral     SpO2 04/11/22 1749 94 %      Weight --      Height --      Head Circumference --      Peak Flow --      Pain Score 04/11/22 1750 0     Pain Loc --      Pain Edu? --      Excl. in [place]? --    No data found.  Updated Vital Signs BP 130/77 (BP Location: Left Arm)   Pulse 80   Temp 98.7 F (37.1 C) (Oral)   Resp 18   SpO2 94%   Visual Acuity Right Eye Distance:   Left Eye Distance:   Bilateral Distance:    Right Eye Near:   Left Eye Near:    Bilateral Near:     Physical Exam Vitals reviewed.  Constitutional:      Appearance: Normal appearance.  HENT:     Right Ear: Tympanic membrane normal.     Left Ear: Tympanic membrane normal.     Nose: Congestion and rhinorrhea present.     Mouth/Throat:     Mouth: Mucous membranes are moist.     Pharynx: Posterior oropharyngeal erythema present. No oropharyngeal exudate.  Cardiovascular:     Rate and Rhythm: Normal rate and regular rhythm.     Pulses: Normal pulses.     Heart sounds: Normal heart sounds.  Pulmonary:     Effort: Pulmonary effort is normal.     Breath sounds: Normal breath sounds.  Skin:    General: Skin is warm and dry.  Neurological:     General: No focal deficit present.     Mental Status: He is alert and oriented to person, place, and time.  Psychiatric:        Mood and Affect: Mood normal.        Behavior: Behavior normal.      UC Treatments / Results  Labs (all labs ordered are listed, but only abnormal results are displayed) Labs Reviewed - No data to display  EKG   Radiology No results found.  Procedures Procedures (including critical care time)  Medications Ordered in UC Medications - No data to display  Initial Impression / Assessment and Plan / UC Course  I have reviewed the triage vital signs and the nursing notes.  Pertinent labs & imaging results that were available during my care of the patient were reviewed by me and considered in my medical decision making (see chart for details).   Patient is afebrile  here without recent antipyretics. Satting well on  room air. Overall is well appearing, well hydrated, without respiratory distress. Pulmonary exam is unremarkable.  Lungs CTAB without wheezing, rhonchi, rales.  Patient's symptoms are consistent with an acute viral process.  Low suspicion for influenza or COVID, however he is outside the treatment window for both and so testing and antiviral therapy are not recommended.  Will prescribe benzonatate given his age and risk for falls as well as CKD.  Otherwise recommending continued use of OTC medication for symptom control.  Patient acknowledges understanding and agreement with this treatment plan.   Final Clinical Impressions(s) / UC Diagnoses   Final diagnoses:  None   Discharge Instructions   None    ED Prescriptions   None    PDMP not reviewed this encounter.   Rose Phi, [place] 04/11/22 1810

## 2022-04-16 ENCOUNTER — Ambulatory Visit (INDEPENDENT_AMBULATORY_CARE_PROVIDER_SITE_OTHER): Payer: Medicare HMO | Admitting: Internal Medicine

## 2022-04-16 ENCOUNTER — Ambulatory Visit (INDEPENDENT_AMBULATORY_CARE_PROVIDER_SITE_OTHER)
Admission: RE | Admit: 2022-04-16 | Discharge: 2022-04-16 | Disposition: A | Discharge status: Home / Self Care | Payer: Medicare HMO | Source: Ambulatory Visit | Attending: Internal Medicine | Admitting: Internal Medicine

## 2022-04-16 ENCOUNTER — Encounter: Payer: Self-pay | Admitting: Internal Medicine

## 2022-04-16 VITALS — BP 118/86 | HR 76 | Temp 98.0°F | Ht 64.0 in | Wt 180.8 lb

## 2022-04-16 DIAGNOSIS — J019 Acute sinusitis, unspecified: Secondary | ICD-10-CM | POA: Insufficient documentation

## 2022-04-16 DIAGNOSIS — R0602 Shortness of breath: Secondary | ICD-10-CM | POA: Diagnosis not present

## 2022-04-16 DIAGNOSIS — J0100 Acute maxillary sinusitis, unspecified: Secondary | ICD-10-CM | POA: Diagnosis not present

## 2022-04-16 DIAGNOSIS — R059 Cough, unspecified: Secondary | ICD-10-CM | POA: Diagnosis not present

## 2022-04-16 DIAGNOSIS — R0989 Other specified symptoms and signs involving the circulatory and respiratory systems: Secondary | ICD-10-CM | POA: Diagnosis not present

## 2022-04-16 MED ORDER — AMOXICILLIN-POT CLAVULANATE 875-125 MG PO TABS
1.0000 | ORAL_TABLET | Freq: Two times a day (BID) | ORAL | 0 refills | Status: DC
Start: 2022-04-16 — End: 2022-04-18

## 2022-04-16 NOTE — Assessment & Plan Note (Signed)
CXR normal Will treat with augmentin 875 bid x 7 days and continue supportive care OOW tomorrow

## 2022-04-16 NOTE — Assessment & Plan Note (Signed)
Respiratory infection seems sinus but has RLL crackles will check CXR

## 2022-04-16 NOTE — Progress Notes (Signed)
Subjective:    Patient ID: Bobby Woods, male    DOB: 15-Aug-1945, 77 y.o.   MRN: [id number]  HPI Here for urgent care follow up with persistent respiratory symptoms  Started about 9 days ago Runny nose--constant cough Tried robitussin--not much help No fever No SOB No sore throat or ear pain  Urgent care 3/29--diagnosed with viral URI Since then---tried benzonatate but not helping that much  Now feels it with his breathing---loud breathing. Only SOB in coughing spell  No headache---or slight  Post nasal drip Cough productive clear to yellow mucus  Current Outpatient Medications on File Prior to Visit  Medication Sig Dispense Refill   amLODipine (NORVASC) 5 MG tablet Take 1 tablet (5 mg total) by mouth daily. 90 tablet 3   benzonatate (TESSALON) 100 MG capsule Take 1-2 tablets 3 times a day as needed for cough 30 capsule 0   clopidogrel (PLAVIX) 75 MG tablet TAKE 1 TABLET BY MOUTH EVERY DAY 90 tablet 3   finasteride (PROSCAR) 5 MG tablet TAKE 1 TABLET BY MOUTH EVERY DAY 90 tablet 3   metoprolol succinate (TOPROL-XL) 25 MG 24 hr tablet TAKE 1 TABLET BY MOUTH EVERY DAY 90 tablet 3   pantoprazole (PROTONIX) 40 MG tablet TAKE 1 TABLET BY MOUTH EVERY DAY 90 tablet 3   tamsulosin (FLOMAX) 0.4 MG CAPS capsule TAKE 2 CAPSULES (0.8 MG TOTAL) BY MOUTH DAILY AFTER SUPPER. 180 capsule 2   tizanidine (ZANAFLEX) 2 MG capsule Take 1 capsule (2 mg total) by mouth at bedtime as needed for muscle spasms. 10 capsule 0   No current facility-administered medications on file prior to visit.    Allergies  Allergen Reactions   Azithromycin Dihydrate Rash    GI upset    Past Medical History:  Diagnosis Date   Arthritis    Blood transfusion without reported diagnosis    age 32 year    BPH (benign prostatic hypertrophy)    Chronic kidney disease    GERD (gastroesophageal reflux disease)    Hypertension    on medicines     Past Surgical History:  Procedure Laterality Date   COLONOSCOPY   2006   [place]   PACEMAKER IMPLANT N/A 09/16/2019   Procedure: PACEMAKER IMPLANT;  Surgeon: Vickie Epley, MD;  Location: [facility];  Service: Cardiovascular;  Laterality: N/A;   PROSTATE BIOPSY  2011   negative   RETINAL DETACHMENT SURGERY  1985   left, right 05/07   ROTATOR CUFF REPAIR  10/09   right   TONSILLECTOMY  1972    Family History  Problem Relation Age of Onset   Cancer Father        prostate   Cancer Paternal Uncle        prostate cancer   Colon cancer Neg Hx    Rectal cancer Neg Hx    Stomach cancer Neg Hx     Social History   Socioeconomic History   Marital status: Married    Spouse name: Not on file   Number of children: 3   Years of education: Not on file   Highest education level: Not on file  Occupational History   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    Employer: [employer]   Occupation: Quality assurance--cleaning    Employer: [employer]     Occupation: Custodial/funeral assistance    Comment: [place]  Tobacco Use   Smoking status: Never    Passive exposure: Past   Smokeless tobacco: Never  Vaping Use   Vaping Use: Never used  Substance and Sexual Activity   Alcohol use: No   Drug use: No   Sexual activity: Not on file  Other Topics Concern   Not on file  Social History Narrative   Has living will   Wife has [facility] POA and alternate is [name] (cousin)   Would accept attempts at resuscitation but no prolonged artificial ventilation   Not prolonged feeding tube   Social Determinants of Health   Financial Resource Strain: Not on file  Food Insecurity: Not on file  Transportation Needs: Not on file  Physical Activity: Not on file  Stress: Not on file  Social Connections: Not on file  Intimate Partner Violence: Not on file   Review of Systems No N/V Eating okay     Objective:   Physical Exam Constitutional:      Appearance:  Normal appearance.  HENT:     Head:     Comments: Mild maxillary sinus tenderness    Right Ear: Tympanic membrane and ear canal normal.     Left Ear: Tympanic membrane and ear canal normal.     Mouth/Throat:     Pharynx: No oropharyngeal exudate or posterior oropharyngeal erythema.  Pulmonary:     Effort: Pulmonary effort is normal. No respiratory distress.     Breath sounds: No wheezing.     Comments: Slight RLL crackles Musculoskeletal:     Cervical back: Neck supple.  Lymphadenopathy:     Cervical: No cervical adenopathy.  Neurological:     Mental Status: He is alert.            Assessment & Plan:

## 2022-04-18 ENCOUNTER — Telehealth: Payer: Self-pay | Admitting: Internal Medicine

## 2022-04-18 MED ORDER — DOXYCYCLINE HYCLATE 100 MG PO TABS: 100.0000 mg | ORAL_TABLET | Freq: Two times a day (BID) | ORAL | 0 refills | Status: DC

## 2022-04-18 NOTE — Telephone Encounter (Signed)
Spoke to pt

## 2022-04-18 NOTE — Telephone Encounter (Signed)
Patient called and stated since he has been taking the amoxicillin-clavulanate (AUGMENTIN) 875-125 MG tablet  as prescribed by Dr. Alphonsus Sias, he  has had an upset stomach/ diarrhea. Also states that his breathing is still the same. Patient also inquired about a chest xray Dr. Alphonsus Sias had ordered at his visit and stated he has not received a call with the results. Would like a call back at [phone].

## 2022-04-18 NOTE — Telephone Encounter (Signed)
Please let him know that I have not yet gotten the official report on the CXR---but I will work to get that today.  I sent a different antibiotic for him to his pharmacy--he should stop the augmentin I gave him the other day

## 2022-04-30 ENCOUNTER — Ambulatory Visit (INDEPENDENT_AMBULATORY_CARE_PROVIDER_SITE_OTHER): Payer: Medicare HMO | Admitting: Family Medicine

## 2022-04-30 ENCOUNTER — Encounter: Payer: Self-pay | Admitting: Family Medicine

## 2022-04-30 VITALS — BP 114/70 | HR 70 | Temp 98.1°F | Ht 64.0 in | Wt 180.4 lb

## 2022-04-30 DIAGNOSIS — J029 Acute pharyngitis, unspecified: Secondary | ICD-10-CM

## 2022-04-30 LAB — POCT RAPID STREP A (OFFICE): Rapid Strep A Screen: NEGATIVE

## 2022-04-30 NOTE — Progress Notes (Signed)
Bobby Kleinpeter T. Yerick Eggebrecht, MD, CAQ Sports Medicine [facility] [address] [place], [address]  Phone: [phone]  FAX: [phone]  Bobby Woods - 77 y.o. male  MRN [id number]  Date of Birth: July 13, 1945  Date: 04/30/2022  PCP: Karie Schwalbe, MD  Referral: Karie Schwalbe, MD  Chief Complaint  Patient presents with   Sore Throat    Started this morning Getting over URI-Just finished course of Doxycycline   Subjective:   Bobby Woods is a 77 y.o. very pleasant male patient with Body mass index is 30.96 kg/m. who presents with the following:  Has been having some URI symptoms.  Woke up this morning with a severe sore throat.   Uvula is sore, felt like throat had blisters in it.   Aside from this, his URI symptoms including runny nose and cough have been improving.  He is not having any fever, chills, or headaches. No nausea, vomiting, diarrhea No earache He is breathing comfortably  Review of Systems is noted in the HPI, as appropriate  Objective:   BP 114/70   Pulse 70   Temp 98.1 F (36.7 C) (Temporal)   Ht  (1.626 m)   Wt 180 lb 6 oz (81.8 kg)   SpO2 97%   BMI 30.96 kg/m   GEN: No acute distress; alert,appropriate. PULM: Breathing comfortably in no respiratory distress PSYCH: Normally interactive.  ENT: There is some mild swelling and enlargement as well as reddish coloration at the uvula, but the posterior oropharynx is clear and normal in appearance Notes cervical lymphadenopathy CV: RRR, no m/g/r   Laboratory and Imaging Data: Results for orders placed or performed in visit on 04/30/22  POCT rapid strep A  Result Value Ref Range   Rapid Strep A Screen Negative Negative     Assessment and Plan:     ICD-10-CM   1. Sore throat  J02.9 POCT rapid strep A     Likely viral pharyngitis with some uvulitis.  Strep negative.  Supportive care only.  Medication Management during today's office  visit: No orders of the defined types were placed in this encounter.  Medications Discontinued During This Encounter  Medication Reason   doxycycline (VIBRA-TABS) 100 MG tablet Completed Course    Orders placed today for conditions managed today: Orders Placed This Encounter  Procedures   POCT rapid strep A    Disposition: No follow-ups on file.  Dragon [employer] One speech-to-text software was used for transcription in this dictation.  Possible transcriptional errors can occur using Animal nutritionist.   Signed,  Elpidio Galea. Demarkis Gheen, MD   Outpatient Encounter Medications as of 04/30/2022  Medication Sig   amLODipine (NORVASC) 5 MG tablet Take 1 tablet (5 mg total) by mouth daily.   benzonatate (TESSALON) 100 MG capsule Take 1-2 tablets 3 times a day as needed for cough   clopidogrel (PLAVIX) 75 MG tablet TAKE 1 TABLET BY MOUTH EVERY DAY   finasteride (PROSCAR) 5 MG tablet TAKE 1 TABLET BY MOUTH EVERY DAY   metoprolol succinate (TOPROL-XL) 25 MG 24 hr tablet TAKE 1 TABLET BY MOUTH EVERY DAY   pantoprazole (PROTONIX) 40 MG tablet TAKE 1 TABLET BY MOUTH EVERY DAY   tamsulosin (FLOMAX) 0.4 MG CAPS capsule TAKE 2 CAPSULES (0.8 MG TOTAL) BY MOUTH DAILY AFTER SUPPER.   tizanidine (ZANAFLEX) 2 MG capsule Take 1 capsule (2 mg total) by mouth at bedtime as needed for muscle spasms.   [DISCONTINUED]  doxycycline (VIBRA-TABS) 100 MG tablet Take 1 tablet (100 mg total) by mouth 2 (two) times daily.   No facility-administered encounter medications on file as of 04/30/2022.

## 2022-05-23 ENCOUNTER — Telehealth: Payer: Self-pay

## 2022-05-23 DIAGNOSIS — I495 Sick sinus syndrome: Secondary | ICD-10-CM

## 2022-05-23 NOTE — Telephone Encounter (Signed)
Transmission received, patient is having episodes of SVT episodes are typically less than a minute. Patient reports compliance with Toprol XL 25mg . Advised patient to make sure he staying hydrated. Informed patient that I would send this information over to Dr. Lalla Brothers and Dr. Alphonsus Sias

## 2022-05-23 NOTE — Telephone Encounter (Signed)
Pt called in stating his heart has been beating so hard and he can feel it in his throat and he has some chest pain. Pt is sending in transmission now and would like a call back

## 2022-05-26 MED ORDER — METOPROLOL SUCCINATE ER 25 MG PO TB24: 25.0000 mg | ORAL_TABLET | Freq: Two times a day (BID) | ORAL | 3 refills | Status: DC

## 2022-05-26 NOTE — Telephone Encounter (Signed)
Pt called in for an update. He ask that you call his mobile number.

## 2022-05-26 NOTE — Telephone Encounter (Signed)
Attempted to return call. Patient is not home. Family advised she will have patient call when he returns home.

## 2022-05-26 NOTE — Telephone Encounter (Signed)
Pt return nurse all. His phone number is [phone].

## 2022-05-26 NOTE — Telephone Encounter (Signed)
Called patient, advised to take Toprol-XL 25mg  twice a day, starting tomorrow 05/27/22. Also, expect call from scheduling to make apt next month with EP APP. Pt voiced understanding.

## 2022-05-29 ENCOUNTER — Telehealth: Payer: Self-pay | Admitting: Cardiology

## 2022-05-29 NOTE — Telephone Encounter (Signed)
Patient called stating that this morning he could feel his heart beating in his throat. Patient stated they took their Toprol and can still feel his heart beating in his throat. Please advise.

## 2022-05-30 NOTE — Telephone Encounter (Signed)
Patient c/o Palpitations:  High priority if patient c/o lightheadedness, shortness of breath, or chest pain  How long have you had palpitations/irregular HR/ Afib? Are you having the symptoms now?   No, but had this morning  Are you currently experiencing lightheadedness, SOB or CP?   A little SOB yesterday, at times he has had chest pain  Do you have a history of afib (atrial fibrillation) or irregular heart rhythm?   Yes  Have you checked your BP or HR? (document readings if available):  Not available  Are you experiencing any other symptoms?  No  Patient stated he is still having palpitations and wants to know next steps.

## 2022-05-30 NOTE — Telephone Encounter (Signed)
Remote transmission received. Normal device function. No episodes noted. Patient aware of 06/02/22 apt.

## 2022-05-30 NOTE — Telephone Encounter (Signed)
Spoke with the patient who states that he increased his metoprolol to BID on Monday per Dr. Lalla Brothers. He states that it helped for a couple of days however yesterday afternoon his palpitations returned. Patient states that he can feel his heart beating in his throat. He is not at home right now but will send a transmission when he gets back. He is scheduled to see Sherie Don, NP on Monday 5/20

## 2022-05-31 NOTE — Progress Notes (Unsigned)
Cardiology Office Note Date:  06/02/2022  Patient ID:  Bobby, Woods February 09, 1945, MRN [id number] PCP:  Karie Schwalbe, MD  Cardiologist:  Debbe Odea, MD Electrophysiologist: Lanier Prude, MD    Chief Complaint: palpitations  History of Present Illness: Bobby Woods is a 77 y.o. male with PMH notable for parox SVT, tachy-brady s/p PPM, HTN; seen today for Lanier Prude, MD for routine electrophysiology followup.  Last saw Dr. Lalla Brothers 09/2021, was doing well without cardiac complaints. He called clinic 5/10 c/o feeling palpitations and "heart beating in throat". Remote transmission showed brief SVT episodes, and so increased toprol to 25mg  BID. He called clinic again 5/17 had felt better for a few days, but now having same complaints with the higher toprol dose. Remote transmission showed NSR.  He continues to have intermittent palpitations, feels the heart beat in his throat.  No chest pain, SOB, dizziness with palpitation episodes. No syncope.  He has had similar symptoms before, like right before he had his pacemaker put in, but has not had these symptoms since the PM implant.    Device Information: MDT dual chamber PPM, imp 09/2019; SND  Past Medical History:  Diagnosis Date   Arthritis    Blood transfusion without reported diagnosis    age 89 year    BPH (benign prostatic hypertrophy)    Chronic kidney disease    GERD (gastroesophageal reflux disease)    Hypertension    on medicines     Past Surgical History:  Procedure Laterality Date   COLONOSCOPY  2006   HAND SURGERY     INGUINAL HERNIA REPAIR  1965   PACEMAKER IMPLANT N/A 09/16/2019   Procedure: PACEMAKER IMPLANT;  Surgeon: Lanier Prude, MD;  Location: [facility];  Service: Cardiovascular;  Laterality: N/A;   PROSTATE BIOPSY  2011   negative   RETINAL DETACHMENT SURGERY  1985   left, right 05/07   ROTATOR CUFF REPAIR  10/09   right   TONSILLECTOMY  1972    Current  Outpatient Medications  Medication Instructions   amLODipine (NORVASC) 5 mg, Oral, Daily   clopidogrel (PLAVIX) 75 MG tablet TAKE 1 TABLET BY MOUTH EVERY DAY   finasteride (PROSCAR) 5 MG tablet TAKE 1 TABLET BY MOUTH EVERY DAY   metoprolol succinate (TOPROL-XL) 25 mg, Oral, 2 times daily   pantoprazole (PROTONIX) 40 MG tablet TAKE 1 TABLET BY MOUTH EVERY DAY   tamsulosin (FLOMAX) 0.8 mg, Oral, Daily after supper    Social History:  The patient  reports that he has never smoked. He has been exposed to tobacco smoke. He has never used smokeless tobacco. He reports that he does not drink alcohol and does not use drugs.   Family History:  The patient's family history includes Cancer in his father and paternal uncle.  ROS:  Please see the history of present illness. All other systems are reviewed and otherwise negative.   PHYSICAL EXAM:   Vitals:   06/02/22 1346 06/02/22 1608  BP: (!) 130/94 138/88  Pulse: 72   Height: 5\' 4"  (1.626 m)   Weight: 180 lb (81.6 kg)   SpO2: 97%   BMI (Calculated): 30.88     GEN- The patient is well appearing, alert and oriented x 3 today.   Lungs- Clear to ausculation bilaterally, normal work of breathing.  Heart- Regular rate and rhythm, no murmurs, rubs or gallops Extremities- No peripheral edema, warm, dry Skin-   device pocket well-healed, no  tethering   Device interrogation done today and reviewed by myself:  Battery 10+ years Lead thresholds, impedence, sensing stable  Patient experienced same palpitation symptom during threshold testing Frequent Atach episodes (some labelled as NSVT) Adjusted atrial therapies   - 2 zones with AT/AF ATP therapies   EKG is ordered. Personal review of EKG from today shows:  A-paced, rate 72bpm;   Recent Labs: 08/02/2021: ALT 19 06/02/2022: BUN 13; Creatinine, Ser 1.20; Hemoglobin 14.6; Platelets 174; Potassium 4.0; Sodium 138  No results found for requested labs within last 365 days.   Estimated Creatinine  Clearance: 50.5 mL/min (by C-G formula based on SCr of 1.2 mg/dL).   Wt Readings from Last 3 Encounters:  06/02/22 180 lb (81.6 kg)  04/30/22 180 lb 6 oz (81.8 kg)  04/16/22 180 lb 12.8 oz (82 kg)     Additional studies reviewed include: Previous EP, cardiology notes.   NM myocardial perfusion, 10/30/2020   Abnormal, probably low risk pharmacologic myocardial perfusion stress test.   There is a small in size, mild in severity, reversible defect involving the apical lateral and apical inferior segments that may reflect subtle ischemia but cannot rule out artifact.   There is no evidence of prior infarct.   Left ventricular function is hyperdynamic (LVEF > 65%).   No significant coronary artery calcification is observed.   Incidental note is made of mild aortic atherosclerosis, cardiac pacemaker, multiple calcified gallstones, and indeterminate hepatic hypodensity (question cyst as noted on prior abdominal CT in 2020).  TTE, 07/27/2019  1. Left ventricular ejection fraction, by estimation, is 60 to 65%. The left ventricle has normal function. The left ventricle has no regional wall motion abnormalities. Left ventricular diastolic parameters were normal.   2. Right ventricular systolic function is normal. The right ventricular size is mildly enlarged. There is normal pulmonary artery systolic pressure.   3. The mitral valve is normal in structure. No evidence of mitral valve regurgitation.   4. The aortic valve is tricuspid. Aortic valve regurgitation is not visualized.   5. The inferior vena cava is normal in size with greater than 50% respiratory variability, suggesting right atrial pressure of 3 mmHg.    ASSESSMENT AND PLAN:  #) Tachy-brady syndrome #) s/p MDT PPM  #) palpitations Recent episodes of atrial tach by device Adjusted atrial therapies to include ATP for episodes lasting longer than 1 minute Continue toprol 25mg  BID - update BMP, CBC   #) HTN Elevated in  office Patient does not regularly check BP at home Encouraged him to measure BP and record readings for review at follow-up appt   Current medicines are reviewed at length with the patient today.   The patient does not have concerns regarding his medicines.  The following changes were made today:  none  Labs/ tests ordered today include:  Orders Placed This Encounter  Procedures   CBC   Basic Metabolic Panel (BMET)   EKG 12-Lead     Disposition: Follow up with EP APP in  2-4 weeks    Signed, Sherie Don, NP  06/02/22  4:01 PM  Electrophysiology [facility]

## 2022-06-02 ENCOUNTER — Other Ambulatory Visit
Admission: RE | Admit: 2022-06-02 | Discharge: 2022-06-02 | Disposition: A | Discharge status: Home / Self Care | Payer: Medicare HMO | Source: Ambulatory Visit | Attending: Cardiology | Admitting: Cardiology

## 2022-06-02 ENCOUNTER — Ambulatory Visit: Payer: Medicare HMO | Attending: Cardiology | Admitting: Cardiology

## 2022-06-02 ENCOUNTER — Encounter: Payer: Self-pay | Admitting: Cardiology

## 2022-06-02 VITALS — BP 138/88 | HR 72 | Ht 64.0 in | Wt 180.0 lb

## 2022-06-02 DIAGNOSIS — I495 Sick sinus syndrome: Secondary | ICD-10-CM | POA: Diagnosis not present

## 2022-06-02 DIAGNOSIS — R002 Palpitations: Secondary | ICD-10-CM

## 2022-06-02 DIAGNOSIS — R5383 Other fatigue: Secondary | ICD-10-CM

## 2022-06-02 DIAGNOSIS — Z950 Presence of cardiac pacemaker: Secondary | ICD-10-CM | POA: Diagnosis not present

## 2022-06-02 DIAGNOSIS — I10 Essential (primary) hypertension: Secondary | ICD-10-CM

## 2022-06-02 LAB — BASIC METABOLIC PANEL
Anion gap: 8 (ref 5–15)
BUN: 13 mg/dL (ref 8–23)
CO2: 26 mmol/L (ref 22–32)
Calcium: 8.8 mg/dL — ABNORMAL LOW (ref 8.9–10.3)
Chloride: 104 mmol/L (ref 98–111)
Creatinine, Ser: 1.2 mg/dL (ref 0.61–1.24)
GFR, Estimated: >60 mL/min (ref >60)
Glucose, Bld: 95 mg/dL (ref 70–99)
Potassium: 4 mmol/L (ref 3.5–5.1)
Sodium: 138 mmol/L (ref 135–145)

## 2022-06-02 LAB — CBC
HCT: 42.2 % (ref 39.0–52.0)
Hemoglobin: 14.6 g/dL (ref 13.0–17.0)
MCH: 31.1 pg (ref 26.0–34.0)
MCHC: 34.6 g/dL (ref 30.0–36.0)
MCV: 90 fL (ref 80.0–100.0)
Platelets: 174 10*3/uL (ref 150–400)
RBC: 4.69 MIL/uL (ref 4.22–5.81)
RDW: 12.4 % (ref 11.5–15.5)
WBC: 6.9 10*3/uL (ref 4.0–10.5)
nRBC: 0 % (ref 0.0–0.2)

## 2022-06-02 LAB — CUP PACEART INCLINIC DEVICE CHECK
Date Time Interrogation Session: 20240520162258
Implantable Lead Connection Status: 753985
Implantable Lead Connection Status: 753985
Implantable Lead Implant Date: 20210903
Implantable Lead Implant Date: 20210903
Implantable Lead Location: 753859
Implantable Lead Location: 753860
Implantable Lead Model: 5076
Implantable Lead Model: 5076
Implantable Pulse Generator Implant Date: 20210903

## 2022-06-02 NOTE — Patient Instructions (Signed)
Medication Instructions:  Your physician recommends that you continue on your current medications as directed. Please refer to the Current Medication list given to you today.  *If you need a refill on your cardiac medications before your next appointment, please call your pharmacy*   Lab Work: BMP and CBC - Please go to the [facility]. You will check in at the front desk to the right as you walk into the atrium. Valet Parking is offered if needed. - No appointment needed. You may go any day between 7 am and 6 pm.  If you have labs (blood work) drawn today and your tests are completely normal, you will receive your results only by: MyChart Message (if you have MyChart) OR A paper copy in the mail If you have any lab test that is abnormal or we need to change your treatment, we will call you to review the results.   Testing/Procedures: No testing ordered  Follow-Up: At [facility], you and your health needs are our priority.  As part of our continuing mission to provide you with exceptional heart care, we have created designated Provider Care Teams.  These Care Teams include your primary Cardiologist (physician) and Advanced Practice Providers (APPs -  Physician Assistants and Nurse Practitioners) who all work together to provide you with the care you need, when you need it.  We recommend signing up for the patient portal called "MyChart".  Sign up information is provided on this After Visit Summary.  MyChart is used to connect with patients for Virtual Visits (Telemedicine).  Patients are able to view lab/test results, encounter notes, upcoming appointments, etc.  Non-urgent messages can be sent to your provider as well.   To learn more about what you can do with MyChart, go to [email or web address].    Your next appointment:   3 week(s)  Provider:   Sherie Don, NP

## 2022-06-12 ENCOUNTER — Telehealth: Payer: Self-pay | Admitting: Cardiology

## 2022-06-12 NOTE — Telephone Encounter (Signed)
Spoke with patient and he stated that he felt this heart racing today and got a little dizzy when bending over. Patient stated that his HR was around 85 but that is high for him. Informed the patient that a normal heart rate: The normal heart rate is regular and between 60 and 100 beats per minute. The heart rate increases with exercise, emotional stress, and fever.  * Palpitations: Everybody experiences palpitations at some point in their lives. In most circumstances it is simply a heightened awareness of the heart's normal beating. * Rapid heart rate: Patients with anxiety or stress may describe a 'rapid heartbeat' or 'pounding' in their chest from their heart beating. If they are able to measure their own heart rate, they will relate that the heart is beating regularly at < 140 beats/minute. Patients with a heart rate greater than 140 (excluding during exercise) require evaluation. * Extra heartbeats: Occasional extra heartbeats are also experienced by almost everyone. Patients may state that their heart 'jumps', 'skips a beat', or 'flip-flops'. Lack of sleep, stress, and caffeinated beverages can aggravate this condition. If the patient states that this is occurring 4 or more times per minute on an ongoing basis, then they require physician evaluation. Patient was satisfied with response and stated that he would monitor signs and symptoms.

## 2022-06-12 NOTE — Telephone Encounter (Signed)
Pt called in today he states he feel like his hr has been up today and he got a little dizzy. He did not records his hr and he denies any other symptoms. He is unsure what to do, please advise.

## 2022-06-13 ENCOUNTER — Ambulatory Visit (INDEPENDENT_AMBULATORY_CARE_PROVIDER_SITE_OTHER): Payer: Medicare HMO

## 2022-06-13 DIAGNOSIS — I495 Sick sinus syndrome: Secondary | ICD-10-CM

## 2022-06-13 LAB — CUP PACEART REMOTE DEVICE CHECK
Battery Remaining Longevity: 127 mo
Battery Voltage: 3.01 V
Brady Statistic AP VP Percent: 0.06 %
Brady Statistic AP VS Percent: 94.31 %
Brady Statistic AS VP Percent: 0 %
Brady Statistic AS VS Percent: 5.62 %
Brady Statistic RA Percent Paced: 94.43 %
Brady Statistic RV Percent Paced: 0.07 %
Date Time Interrogation Session: 20240530205724
Implantable Lead Connection Status: 753985
Implantable Lead Connection Status: 753985
Implantable Lead Implant Date: 20210903
Implantable Lead Implant Date: 20210903
Implantable Lead Location: 753859
Implantable Lead Location: 753860
Implantable Lead Model: 5076
Implantable Lead Model: 5076
Implantable Pulse Generator Implant Date: 20210903
Lead Channel Impedance Value: 285 Ohm
Lead Channel Impedance Value: 304 Ohm
Lead Channel Impedance Value: 342 Ohm
Lead Channel Impedance Value: 361 Ohm
Lead Channel Pacing Threshold Amplitude: 0.625 V
Lead Channel Pacing Threshold Amplitude: 0.75 V
Lead Channel Pacing Threshold Pulse Width: 0.4 ms
Lead Channel Pacing Threshold Pulse Width: 0.4 ms
Lead Channel Sensing Intrinsic Amplitude: 3.5 mV
Lead Channel Sensing Intrinsic Amplitude: 3.5 mV
Lead Channel Sensing Intrinsic Amplitude: 7 mV
Lead Channel Sensing Intrinsic Amplitude: 7 mV
Lead Channel Setting Pacing Amplitude: 1.5 V
Lead Channel Setting Pacing Amplitude: 2 V
Lead Channel Setting Pacing Pulse Width: 0.4 ms
Lead Channel Setting Sensing Sensitivity: 1.2 mV
Zone Setting Status: 755011
Zone Setting Status: 755011

## 2022-06-20 NOTE — Progress Notes (Unsigned)
Cardiology Office Note Date:  06/20/2022  Patient ID:  Humza, Tallerico Dec 18, 1945, MRN [id number] PCP:  Karie Schwalbe, MD  Cardiologist:  Debbe Odea, MD Electrophysiologist: Lanier Prude, MD    Chief Complaint: palpitations  History of Present Illness: DENG KEMLER is a 77 y.o. male with PMH notable for parox SVT, tachy-brady s/p PPM, HTN; seen today for Lanier Prude, MD for routine electrophysiology followup.  Last saw Dr. Lalla Brothers 09/2021, was doing well without cardiac complaints. I saw him in clinic about 3 weeks ago.  He was complaining of "heart beating in throat".  He was having atrial tach episodes by device.  I adjusted his atrial therapies, adding ATP for AT/AF episode lasting longer than 1 minute.   ***Palpitations, any improvement ***Blood pressure readings at home   He denies chest pain, palpitations, dyspnea, PND, orthopnea, nausea, vomiting, dizziness, syncope, edema, weight gain, or early satiety.      Device Information: MDT dual chamber PPM, imp 09/2019; SND  Past Medical History:  Diagnosis Date   Arthritis    Blood transfusion without reported diagnosis    age 45 year    BPH (benign prostatic hypertrophy)    Chronic kidney disease    GERD (gastroesophageal reflux disease)    Hypertension    on medicines     Past Surgical History:  Procedure Laterality Date   COLONOSCOPY  2006   HAND SURGERY     INGUINAL HERNIA REPAIR  1965   PACEMAKER IMPLANT N/A 09/16/2019   Procedure: PACEMAKER IMPLANT;  Surgeon: Lanier Prude, MD;  Location: [facility];  Service: Cardiovascular;  Laterality: N/A;   PROSTATE BIOPSY  2011   negative   RETINAL DETACHMENT SURGERY  1985   left, right 05/07   ROTATOR CUFF REPAIR  10/09   right   TONSILLECTOMY  1972    Current Outpatient Medications  Medication Instructions   amLODipine (NORVASC) 5 mg, Oral, Daily   clopidogrel (PLAVIX) 75 MG tablet TAKE 1 TABLET BY MOUTH EVERY DAY    finasteride (PROSCAR) 5 MG tablet TAKE 1 TABLET BY MOUTH EVERY DAY   metoprolol succinate (TOPROL-XL) 25 mg, Oral, 2 times daily   pantoprazole (PROTONIX) 40 MG tablet TAKE 1 TABLET BY MOUTH EVERY DAY   tamsulosin (FLOMAX) 0.8 mg, Oral, Daily after supper    Social History:  The patient  reports that he has never smoked. He has been exposed to tobacco smoke. He has never used smokeless tobacco. He reports that he does not drink alcohol and does not use drugs.   Family History:  The patient's family history includes Cancer in his father and paternal uncle.  ROS:  Please see the history of present illness. All other systems are reviewed and otherwise negative.   PHYSICAL EXAM:   There were no vitals filed for this visit.   GEN- The patient is well appearing, alert and oriented x 3 today.   Lungs- Clear to ausculation bilaterally, normal work of breathing.  Heart- Regular rate and rhythm, no murmurs, rubs or gallops Extremities- No peripheral edema, warm, dry Skin-   device pocket well-healed, no tethering   Device interrogation done today and reviewed by myself:  Battery *** Lead thresholds, impedence, sensing stable ***   Patient experienced same palpitation symptom during threshold testing Frequent Atach episodes (some labelled as NSVT) Adjusted atrial therapies   - 2 zones with AT/AF ATP therapies   EKG is not ordered. Personal review of EKG  from  06/02/2022  shows:  A-paced, rate 72bpm;   Recent Labs: 08/02/2021: ALT 19 06/02/2022: BUN 13; Creatinine, Ser 1.20; Hemoglobin 14.6; Platelets 174; Potassium 4.0; Sodium 138  No results found for requested labs within last 365 days.   CrCl cannot be calculated (Unknown ideal weight.).   Wt Readings from Last 3 Encounters:  06/02/22 180 lb (81.6 kg)  04/30/22 180 lb 6 oz (81.8 kg)  04/16/22 180 lb 12.8 oz (82 kg)     Additional studies reviewed include: Previous EP, cardiology notes.   NM myocardial perfusion, 10/30/2020    Abnormal, probably low risk pharmacologic myocardial perfusion stress test.   There is a small in size, mild in severity, reversible defect involving the apical lateral and apical inferior segments that may reflect subtle ischemia but cannot rule out artifact.   There is no evidence of prior infarct.   Left ventricular function is hyperdynamic (LVEF > 65%).   No significant coronary artery calcification is observed.   Incidental note is made of mild aortic atherosclerosis, cardiac pacemaker, multiple calcified gallstones, and indeterminate hepatic hypodensity (question cyst as noted on prior abdominal CT in 2020).  TTE, 07/27/2019  1. Left ventricular ejection fraction, by estimation, is 60 to 65%. The left ventricle has normal function. The left ventricle has no regional wall motion abnormalities. Left ventricular diastolic parameters were normal.   2. Right ventricular systolic function is normal. The right ventricular size is mildly enlarged. There is normal pulmonary artery systolic pressure.   3. The mitral valve is normal in structure. No evidence of mitral valve regurgitation.   4. The aortic valve is tricuspid. Aortic valve regurgitation is not visualized.   5. The inferior vena cava is normal in size with greater than 50% respiratory variability, suggesting right atrial pressure of 3 mmHg.    ASSESSMENT AND PLAN:  #) Tachy-brady syndrome #) s/p MDT PPM  #) palpitations Recent episodes of atrial tach by device Adjusted atrial therapies to include ATP for episodes lasting longer than 1 minute Continue toprol 25mg  BID - update BMP, CBC   #) HTN Elevated in office Patient does not regularly check BP at home Encouraged him to measure BP and record readings for review at follow-up appt   Current medicines are reviewed at length with the patient today.   The patient does not have concerns regarding his medicines.  The following changes were made today:  none  Labs/ tests ordered  today include:  No orders of the defined types were placed in this encounter.    Disposition: Follow up with EP APP in  2-4 weeks    Signed, Sherie Don, NP  06/20/22  11:21 AM  Electrophysiology [facility]

## 2022-06-23 ENCOUNTER — Encounter: Payer: Self-pay | Admitting: Cardiology

## 2022-06-23 ENCOUNTER — Ambulatory Visit: Payer: Medicare HMO | Attending: Cardiology | Admitting: Cardiology

## 2022-06-23 ENCOUNTER — Telehealth: Payer: Self-pay | Admitting: *Deleted

## 2022-06-23 ENCOUNTER — Other Ambulatory Visit
Admission: RE | Admit: 2022-06-23 | Discharge: 2022-06-23 | Disposition: A | Discharge status: Home / Self Care | Payer: Medicare HMO | Source: Ambulatory Visit | Attending: Cardiology | Admitting: Cardiology

## 2022-06-23 ENCOUNTER — Ambulatory Visit: Payer: Medicare HMO | Attending: Cardiology

## 2022-06-23 VITALS — BP 110/70 | HR 70 | Ht 64.0 in | Wt 182.0 lb

## 2022-06-23 DIAGNOSIS — R002 Palpitations: Secondary | ICD-10-CM | POA: Diagnosis not present

## 2022-06-23 DIAGNOSIS — I495 Sick sinus syndrome: Secondary | ICD-10-CM | POA: Diagnosis not present

## 2022-06-23 DIAGNOSIS — I10 Essential (primary) hypertension: Secondary | ICD-10-CM | POA: Diagnosis not present

## 2022-06-23 DIAGNOSIS — I493 Ventricular premature depolarization: Secondary | ICD-10-CM | POA: Diagnosis not present

## 2022-06-23 DIAGNOSIS — Z950 Presence of cardiac pacemaker: Secondary | ICD-10-CM | POA: Diagnosis not present

## 2022-06-23 LAB — CUP PACEART INCLINIC DEVICE CHECK
Date Time Interrogation Session: 20240610162244
Implantable Lead Connection Status: 753985
Implantable Lead Connection Status: 753985
Implantable Lead Implant Date: 20210903
Implantable Lead Implant Date: 20210903
Implantable Lead Location: 753859
Implantable Lead Location: 753860
Implantable Lead Model: 5076
Implantable Lead Model: 5076
Implantable Pulse Generator Implant Date: 20210903

## 2022-06-23 LAB — MAGNESIUM: Magnesium: 2.2 mg/dL (ref 1.7–2.4)

## 2022-06-23 MED ORDER — METOPROLOL SUCCINATE ER 25 MG PO TB24: ORAL_TABLET | ORAL | 0 refills | Status: DC

## 2022-06-23 NOTE — Telephone Encounter (Signed)
   Per Sherie Don, NP-  New Ordered placed for Zio Xt 3 day montior for PVC burden. Pt made aware that monitor has been ordered and he will be receiving monitor by mail soon. Pt has worn a monitor in the past and understands how to place and remove monitor. Pt had no further questions at this time and agreed to plan.

## 2022-06-23 NOTE — Patient Instructions (Signed)
Medication Instructions:   Your physician has recommended you make the following change in your medication:   INCREASE Metoprolol Succinate 25 mg tablet by mouth am and 50 mg by mouth pm daily.  *If you need a refill on your cardiac medications before your next appointment, please call your pharmacy*   Lab Work:  Your physician recommends that you have lab work: TODAY  Magnesium  - Please go to the [facility]. You will check in at the front desk to the right as you walk into the atrium. Valet Parking is offered if needed. - No appointment needed. You may go any day between 7 am and 6 pm.   If you have labs (blood work) drawn today and your tests are completely normal, you will receive your results only by: MyChart Message (if you have MyChart) OR A paper copy in the mail If you have any lab test that is abnormal or we need to change your treatment, we will call you to review the results.   Testing/Procedures:  Your physician has requested that you have an echocardiogram. Echocardiography is a painless test that uses sound waves to create images of your heart. It provides your doctor with information about the size and shape of your heart and how well your heart's chambers and valves are working. This procedure takes approximately one hour. There are no restrictions for this procedure. Please do NOT wear cologne, perfume, aftershave, or lotions (deodorant is allowed). Please arrive 15 minutes prior to your appointment time.    Follow-Up: At [facility], you and your health needs are our priority.  As part of our continuing mission to provide you with exceptional heart care, we have created designated Provider Care Teams.  These Care Teams include your primary Cardiologist (physician) and Advanced Practice Providers (APPs -  Physician Assistants and Nurse Practitioners) who all work together to provide you with the care you need, when you need it.  We recommend  signing up for the patient portal called "MyChart".  Sign up information is provided on this After Visit Summary.  MyChart is used to connect with patients for Virtual Visits (Telemedicine).  Patients are able to view lab/test results, encounter notes, upcoming appointments, etc.  Non-urgent messages can be sent to your provider as well.   To learn more about what you can do with MyChart, go to [email or web address].    Your next appointment:   1 month(s)  Provider:   Sherie Don, NP

## 2022-06-25 ENCOUNTER — Encounter: Payer: Medicare HMO | Admitting: Student

## 2022-06-27 DIAGNOSIS — R002 Palpitations: Secondary | ICD-10-CM

## 2022-06-27 DIAGNOSIS — I493 Ventricular premature depolarization: Secondary | ICD-10-CM | POA: Diagnosis not present

## 2022-06-30 ENCOUNTER — Other Ambulatory Visit: Payer: Medicare HMO

## 2022-07-01 NOTE — Progress Notes (Signed)
Remote pacemaker transmission.   

## 2022-07-07 ENCOUNTER — Ambulatory Visit: Payer: Medicare HMO | Attending: Cardiology

## 2022-07-07 DIAGNOSIS — R002 Palpitations: Secondary | ICD-10-CM | POA: Diagnosis not present

## 2022-07-07 DIAGNOSIS — I495 Sick sinus syndrome: Secondary | ICD-10-CM

## 2022-07-07 DIAGNOSIS — I10 Essential (primary) hypertension: Secondary | ICD-10-CM

## 2022-07-07 DIAGNOSIS — Z950 Presence of cardiac pacemaker: Secondary | ICD-10-CM

## 2022-07-07 LAB — ECHOCARDIOGRAM COMPLETE
Area-P 1/2: 2.83 cm2
S' Lateral: 1.7 cm

## 2022-07-08 DIAGNOSIS — R002 Palpitations: Secondary | ICD-10-CM | POA: Diagnosis not present

## 2022-07-08 DIAGNOSIS — I493 Ventricular premature depolarization: Secondary | ICD-10-CM | POA: Diagnosis not present

## 2022-07-09 ENCOUNTER — Telehealth: Payer: Self-pay | Admitting: Cardiology

## 2022-07-09 DIAGNOSIS — Z0279 Encounter for issue of other medical certificate: Secondary | ICD-10-CM

## 2022-07-09 NOTE — Telephone Encounter (Signed)
Received Disability forms from patient for completion with $29 processing fee. Forms scanned into chart, billing form faxed to billing and forms to be completed. Gave forms to [employer], CMA.

## 2022-07-09 NOTE — Telephone Encounter (Signed)
Forms given to Bobby Don, NP for completion.

## 2022-07-11 ENCOUNTER — Telehealth: Payer: Self-pay | Admitting: Cardiology

## 2022-07-11 NOTE — Telephone Encounter (Signed)
Patient calling in about his monitor results. Please advise

## 2022-07-14 ENCOUNTER — Telehealth: Payer: Self-pay

## 2022-07-14 NOTE — Telephone Encounter (Signed)
-----   Message from Sherie Don, NP sent at 07/14/2022  9:14 AM EDT ----- Could you call him to see if he can come in this Wednesday when CL is here?   Thanks!  ----- Message ----- From: Thayer Headings, Ruthell Rummage Sent: 07/11/2022  12:18 PM EDT To: Sherie Don, NP  The patient said that the increase in metoprolol doesn't seem to be helping much. He is having more frequent episodes since his office visit.   ----- Message ----- From: Sherie Don, NP Sent: 07/11/2022   8:10 AM EDT To: Devlin Brink L Newcomer McClain  Patient's monitor does show episodes of increased heart rates that start from the top chamber. These are called supraventricular tachycardia, or SVT. These episodes corresponded to when he hit the symptomatic button. They are not overtly dangerous, and we can work with medicines to try to reduce them.  Has he noticed any improvement with the increased metoprolol dose?

## 2022-07-14 NOTE — Telephone Encounter (Signed)
Left message requesting to have patient call back to see if he is available for appointment this Wednesday, 07/16/22, per Sherie Don, NP.

## 2022-07-15 NOTE — Progress Notes (Unsigned)
Cardiology Office Note Date:  07/15/2022  Patient ID:  Grove, Savko [birth year], MRN [id number] PCP:  Karie Schwalbe, MD  Cardiologist:  Debbe Odea, MD Electrophysiologist: Lanier Prude, MD    Chief Complaint: palpitations  History of Present Illness: Bobby Woods is a 77 y.o. male with PMH notable for parox SVT, tachy-brady s/p PPM, HTN; seen today for Lanier Prude, MD for routine electrophysiology followup.  Last saw Dr. Lalla Brothers 09/2021, was doing well without cardiac complaints. I saw him in clinic about 05/2022.  He was complaining of "heart beating in throat".  He was having atrial tach episodes by device. Device adjusted to add ATP for AT/AF episodes lasting longer than 1 minute.  I saw him in follow-up about 3 weeks ago and he was complaining of same symptoms. Toprol increased, with 3 day monitor and updated echo ordered.   Today, he states that his symptoms has overall not improved at all, and are sometimes constant for hours. He is also worried about the impaired relaxation of his heart that he was told about that was found on his echo. He has had some hiccups and chest discomfort and thinks it's related to the impaired relaxation of his heart.   He denies chest pain, dyspnea, PND, orthopnea, nausea, vomiting, dizziness, syncope, edema, weight gain, or early satiety.     Device Information: MDT dual chamber PPM, imp 09/2019; SND  Past Medical History:  Diagnosis Date   Arthritis    Blood transfusion without reported diagnosis    age 41 year    BPH (benign prostatic hypertrophy)    Chronic kidney disease    GERD (gastroesophageal reflux disease)    Hypertension    on medicines     Past Surgical History:  Procedure Laterality Date   COLONOSCOPY  2006   HAND SURGERY     INGUINAL HERNIA REPAIR  1965   PACEMAKER IMPLANT N/A 09/16/2019   Procedure: PACEMAKER IMPLANT;  Surgeon: Lanier Prude, MD;  Location: [facility];  Service:  Cardiovascular;  Laterality: N/A;   PROSTATE BIOPSY  2011   negative   RETINAL DETACHMENT SURGERY  1985   left, right 05/07   ROTATOR CUFF REPAIR  10/09   right   TONSILLECTOMY  1972    Current Outpatient Medications  Medication Instructions   amLODipine (NORVASC) 5 mg, Oral, Daily   clopidogrel (PLAVIX) 75 MG tablet TAKE 1 TABLET BY MOUTH EVERY DAY   finasteride (PROSCAR) 5 MG tablet TAKE 1 TABLET BY MOUTH EVERY DAY   metoprolol succinate (TOPROL-XL) 25 MG 24 hr tablet Take 25 mg (1) tablet am and 50 mg (2) tablets pm daily.   pantoprazole (PROTONIX) 40 MG tablet TAKE 1 TABLET BY MOUTH EVERY DAY   tamsulosin (FLOMAX) 0.8 mg, Oral, Daily after supper    Social History:  The patient  reports that he has never smoked. He has been exposed to tobacco smoke. He has never used smokeless tobacco. He reports that he does not drink alcohol and does not use drugs.   Family History:  The patient's family history includes Cancer in his father and paternal uncle.  ROS:  Please see the history of present illness. All other systems are reviewed and otherwise negative.   PHYSICAL EXAM:   There were no vitals filed for this visit.    GEN- The patient is well appearing, alert and oriented x 3 today.   Lungs- Clear to ausculation bilaterally, normal work of  breathing.  Heart- Regular rate and rhythm, no murmurs, rubs or gallops Extremities- No peripheral edema, warm, dry Skin-   device pocket well-healed, no tethering   Device interrogation done today and reviewed by myself:  Battery good Lead thresholds, impedence, sensing stable  Brief SVT episodes noted    EKG is not ordered. Personal review of EKG from  06/23/2022  shows:  A-paced, rate 70bpm; occasional PVC  Recent Labs: 08/02/2021: ALT 19 06/02/2022: BUN 13; Creatinine, Ser 1.20; Hemoglobin 14.6; Platelets 174; Potassium 4.0; Sodium 138 06/23/2022: Magnesium 2.2  No results found for requested labs within last 365 days.   CrCl  cannot be calculated (Patient's most recent lab result is older than the maximum 21 days allowed.).   Wt Readings from Last 3 Encounters:  06/23/22 182 lb (82.6 kg)  06/02/22 180 lb (81.6 kg)  04/30/22 180 lb 6 oz (81.8 kg)     Additional studies reviewed include: Previous EP, cardiology notes.   Long term monitor, 07/10/2022 Paroxysmal SVT noted, longest lasting 19 beats, fastest lasting 9 seconds. Overall PVCs with rare less than 1%, PACs also rare less than 1%. Symptomatic episodes correlate with PVCs and SVT episodes  TTE, 07/07/2022  1. Left ventricular ejection fraction, by estimation, is 60 to 65%. The left ventricle has normal function. The left ventricle has no regional wall motion abnormalities. There is mild left ventricular hypertrophy. Left ventricular diastolic parameters are consistent with Grade I diastolic dysfunction (impaired relaxation). The average left ventricular global longitudinal strain is -17.6 %. The global longitudinal strain is normal.   2. Right ventricular systolic function is normal. The right ventricular size is normal.   3. The mitral valve is normal in structure. Mild mitral valve regurgitation.   4. The aortic valve is tricuspid. Aortic valve regurgitation is not visualized.   5. The inferior vena cava is normal in size with greater than 50% respiratory variability, suggesting right atrial pressure of 3 mmHg.   NM myocardial perfusion, 10/30/2020   Abnormal, probably low risk pharmacologic myocardial perfusion stress test.   There is a small in size, mild in severity, reversible defect involving the apical lateral and apical inferior segments that may reflect subtle ischemia but cannot rule out artifact.   There is no evidence of prior infarct.   Left ventricular function is hyperdynamic (LVEF > 65%).   No significant coronary artery calcification is observed.   Incidental note is made of mild aortic atherosclerosis, cardiac pacemaker, multiple  calcified gallstones, and indeterminate hepatic hypodensity (question cyst as noted on prior abdominal CT in 2020).  TTE, 07/27/2019  1. Left ventricular ejection fraction, by estimation, is 60 to 65%. The left ventricle has normal function. The left ventricle has no regional wall motion abnormalities. Left ventricular diastolic parameters were normal.   2. Right ventricular systolic function is normal. The right ventricular size is mildly enlarged. There is normal pulmonary artery systolic pressure.   3. The mitral valve is normal in structure. No evidence of mitral valve regurgitation.   4. The aortic valve is tricuspid. Aortic valve regurgitation is not visualized.   5. The inferior vena cava is normal in size with greater than 50% respiratory variability, suggesting right atrial pressure of 3 mmHg.    ASSESSMENT AND PLAN:  #) Tachy-brady syndrome s/p MDT PPM  #) palpitations #) PACs #) PVCs Vast majority of symptomatic episodes on recent 3 day monitor correlate with single PVC Few SVT episodes on monitor and by device Reassurance provided to patient  Further increase toprol to 50mg  BID Do not favor AAD at this time Start magnesium No device adjustments made, see paceart for details   #) HTN Normotensive in office   Current medicines are reviewed at length with the patient today.   The patient does not have concerns regarding his medicines.  The following changes were made today:   INCREASE toprol to 50mg  BID START mag ox 400mg  daily   Labs/ tests ordered today include:  No orders of the defined types were placed in this encounter.    Disposition: Follow up with EP APP in 3 months   Signed, Sherie Don, NP  07/15/22  9:16 AM  Electrophysiology [facility]

## 2022-07-16 ENCOUNTER — Ambulatory Visit: Payer: Medicare HMO | Attending: Cardiology | Admitting: Cardiology

## 2022-07-16 ENCOUNTER — Encounter: Payer: Self-pay | Admitting: Cardiology

## 2022-07-16 VITALS — BP 110/80 | HR 88 | Ht 63.0 in | Wt 184.0 lb

## 2022-07-16 DIAGNOSIS — R002 Palpitations: Secondary | ICD-10-CM

## 2022-07-16 DIAGNOSIS — I495 Sick sinus syndrome: Secondary | ICD-10-CM

## 2022-07-16 DIAGNOSIS — Z950 Presence of cardiac pacemaker: Secondary | ICD-10-CM | POA: Diagnosis not present

## 2022-07-16 DIAGNOSIS — I10 Essential (primary) hypertension: Secondary | ICD-10-CM | POA: Diagnosis not present

## 2022-07-16 LAB — CUP PACEART INCLINIC DEVICE CHECK
Date Time Interrogation Session: 20240703122317
Implantable Lead Connection Status: 753985
Implantable Lead Connection Status: 753985
Implantable Lead Implant Date: 20210903
Implantable Lead Implant Date: 20210903
Implantable Lead Location: 753859
Implantable Lead Location: 753860
Implantable Lead Model: 5076
Implantable Lead Model: 5076
Implantable Pulse Generator Implant Date: 20210903

## 2022-07-16 MED ORDER — METOPROLOL SUCCINATE ER 50 MG PO TB24: 50.0000 mg | ORAL_TABLET | Freq: Two times a day (BID) | ORAL | 2 refills | Status: DC

## 2022-07-16 MED ORDER — MAGNESIUM OXIDE 400 MG PO CAPS: 1.0000 | ORAL_CAPSULE | Freq: Every day | ORAL | 2 refills | Status: DC

## 2022-07-16 NOTE — Telephone Encounter (Signed)
Pt forms completed during office visit  forms scanned  in chart  and given to pt

## 2022-07-16 NOTE — Patient Instructions (Addendum)
Medication Instructions:  START magnesium 400 mg by mouth daily INCREASE metoprolol to 50 mg by mouth twice a day  *If you need a refill on your cardiac medications before your next appointment, please call your pharmacy*   Lab Work: No labs ordered  If you have labs (blood work) drawn today and your tests are completely normal, you will receive your results only by: MyChart Message (if you have MyChart) OR A paper copy in the mail If you have any lab test that is abnormal or we need to change your treatment, we will call you to review the results.   Testing/Procedures: No testing ordered  Follow-Up: At [facility], you and your health needs are our priority.  As part of our continuing mission to provide you with exceptional heart care, we have created designated Provider Care Teams.  These Care Teams include your primary Cardiologist (physician) and Advanced Practice Providers (APPs -  Physician Assistants and Nurse Practitioners) who all work together to provide you with the care you need, when you need it.  We recommend signing up for the patient portal called "MyChart".  Sign up information is provided on this After Visit Summary.  MyChart is used to connect with patients for Virtual Visits (Telemedicine).  Patients are able to view lab/test results, encounter notes, upcoming appointments, etc.  Non-urgent messages can be sent to your provider as well.   To learn more about what you can do with MyChart, go to [email or web address].    Your next appointment:   3 month(s)  Provider:   Steffanie Dunn, MD or Sherie Don, NP

## 2022-07-18 ENCOUNTER — Telehealth: Payer: Self-pay | Admitting: Cardiology

## 2022-07-18 NOTE — Telephone Encounter (Signed)
Left a paper to be filed out and faxed ( returned to work form)

## 2022-07-19 IMAGING — CT CT HEART MORP W/ CTA COR W/ SCORE W/ CA W/CM &/OR W/O CM
1 of 15 series · 2 of 20 positions shown, 3 images · non-contrast
Comparison: None.

Addendum:
CLINICAL DATA: chestpain

EXAM:
Cardiac/Coronary  CTA
TECHNIQUE: The patient was scanned on a Siemens Somatoform go.Top scanner.

[Series 27: multiphase % cta coronary 0.60 · axial · 0.37mm/px · z∈[+1732,+1769]mm · 2 of 2760 slices shown, 3 images]
[im 920/2760  vessel]
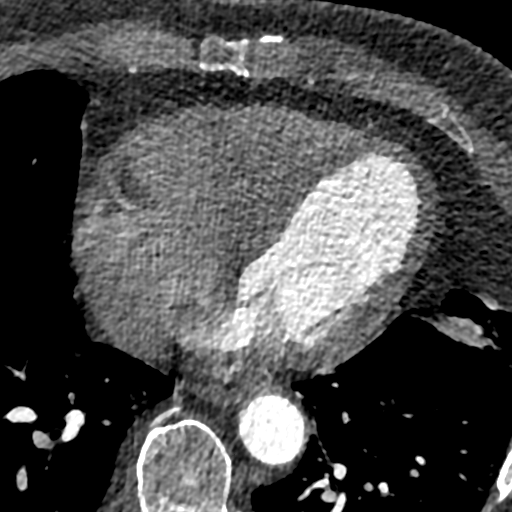
[im 920/2760  lung]
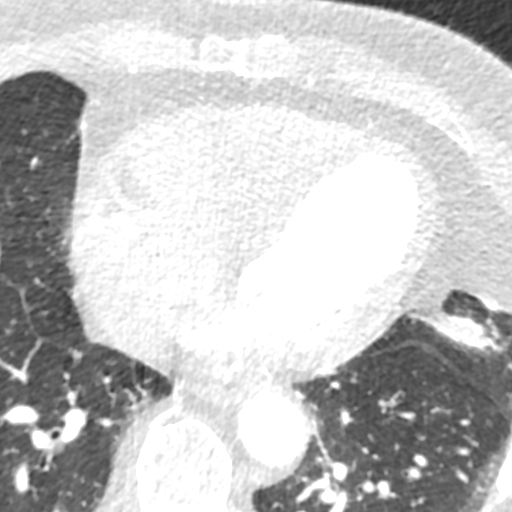
[im 1840/2760  vessel]
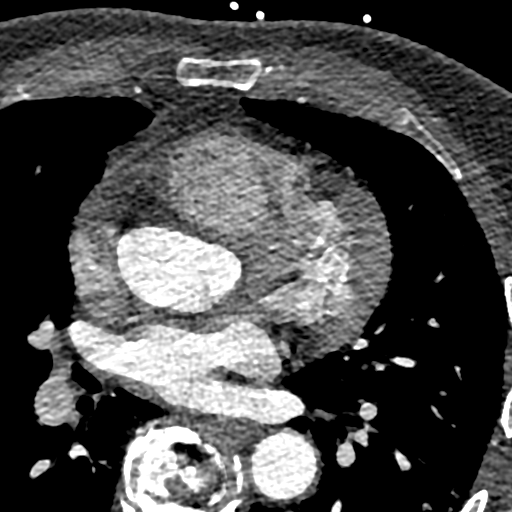

[2 of 20 positions shown; findings below may reference images not displayed]

FINDINGS: A retrospective scan was triggered in the descending thoracic aorta.
Axial non-contrast 3 mm slices were carried out through the heart.
The data set was analyzed on a dedicated work station and scored
using the Agatson method. Gantry rotation speed was 330 msecs and
collimation was .6 mm. 0.8 mg of sl NTG was given. The 3D data set
was reconstructed in 5% intervals of the 45-95 % of the R-R cycle.
Diastolic phases were analyzed on a dedicated work station using
MPR, MIP and VRT modes. The patient received 85 cc of contrast.

Aorta:  Normal size.  No calcifications.  No dissection.

Aortic Valve:  Trileaflet.  No calcifications.

Coronary Arteries:  Normal coronary origin.  Right dominance.

RCA is a large dominant artery that gives rise to PDA and PLA. There
is no plaque.

Left main is a large artery that gives rise to LAD and LCX arteries.

LAD is a large vessel that has no plaque.

LCX is a non-dominant artery that gives rise to two obtuse marginal
branches. There is no plaque.

Other findings:

Normal pulmonary vein drainage into the left atrium.

Normal left atrial appendage without a thrombus.

Normal size of the pulmonary artery.
IMPRESSION: 1. Coronary calcium score of 0. Patient is low risk for near term
coronary events

2. Normal coronary origin with right dominance.

3. No evidence of CAD.

4. CAD-RADS 0. Consider non-atherosclerotic causes of chest pain.

EXAM:
OVER-READ INTERPRETATION  CT CHEST

The following report is an over-read performed by radiologist Dr.
does not include interpretation of cardiac or coronary anatomy or
pathology. The coronary calcium score/coronary CTA interpretation by
the cardiologist is attached.
FINDINGS: The visualized portions of the lower lung fields show no suspicious
nodules, masses, or infiltrates. No pleural fluid seen.

The visualized portions of the mediastinum and chest wall are
unremarkable.
IMPRESSION: No significant non-cardiovascular abnormality seen in visualized
portion of the thorax.

*** End of Addendum ***
FINDINGS: A retrospective scan was triggered in the descending thoracic aorta.
Axial non-contrast 3 mm slices were carried out through the heart.
The data set was analyzed on a dedicated work station and scored
using the Agatson method. Gantry rotation speed was 330 msecs and
collimation was .6 mm. 0.8 mg of sl NTG was given. The 3D data set
was reconstructed in 5% intervals of the 45-95 % of the R-R cycle.
Diastolic phases were analyzed on a dedicated work station using
MPR, MIP and VRT modes. The patient received 85 cc of contrast.

Aorta:  Normal size.  No calcifications.  No dissection.

Aortic Valve:  Trileaflet.  No calcifications.

Coronary Arteries:  Normal coronary origin.  Right dominance.

RCA is a large dominant artery that gives rise to PDA and PLA. There
is no plaque.

Left main is a large artery that gives rise to LAD and LCX arteries.

LAD is a large vessel that has no plaque.

LCX is a non-dominant artery that gives rise to two obtuse marginal
branches. There is no plaque.

Other findings:

Normal pulmonary vein drainage into the left atrium.

Normal left atrial appendage without a thrombus.

Normal size of the pulmonary artery.
IMPRESSION: 1. Coronary calcium score of 0. Patient is low risk for near term
coronary events

2. Normal coronary origin with right dominance.

3. No evidence of CAD.

4. CAD-RADS 0. Consider non-atherosclerotic causes of chest pain.

## 2022-07-21 NOTE — Telephone Encounter (Signed)
Patient came by to drop off incomplete forms. Pt requested that once completed, we call the patient. Forms placed in [name]'s box

## 2022-07-23 ENCOUNTER — Encounter: Payer: Medicare HMO | Admitting: Cardiology

## 2022-07-24 NOTE — Telephone Encounter (Signed)
Patient picked up paperwork incomplete and states we do not need to fill out.

## 2022-08-08 ENCOUNTER — Encounter: Payer: Medicare PPO | Admitting: Internal Medicine

## 2022-08-10 ENCOUNTER — Other Ambulatory Visit: Payer: Self-pay | Admitting: Internal Medicine

## 2022-08-12 ENCOUNTER — Encounter: Payer: Self-pay | Admitting: Internal Medicine

## 2022-08-12 ENCOUNTER — Ambulatory Visit: Payer: Medicare HMO | Admitting: Internal Medicine

## 2022-08-12 VITALS — BP 112/82 | HR 80 | Temp 97.9°F | Ht 63.75 in | Wt 183.0 lb

## 2022-08-12 DIAGNOSIS — N1831 Chronic kidney disease, stage 3a: Secondary | ICD-10-CM | POA: Diagnosis not present

## 2022-08-12 DIAGNOSIS — Z0000 Encounter for general adult medical examination without abnormal findings: Secondary | ICD-10-CM | POA: Diagnosis not present

## 2022-08-12 DIAGNOSIS — F39 Unspecified mood [affective] disorder: Secondary | ICD-10-CM | POA: Diagnosis not present

## 2022-08-12 DIAGNOSIS — N138 Other obstructive and reflux uropathy: Secondary | ICD-10-CM

## 2022-08-12 DIAGNOSIS — I495 Sick sinus syndrome: Secondary | ICD-10-CM

## 2022-08-12 DIAGNOSIS — I10 Essential (primary) hypertension: Secondary | ICD-10-CM | POA: Diagnosis not present

## 2022-08-12 DIAGNOSIS — K219 Gastro-esophageal reflux disease without esophagitis: Secondary | ICD-10-CM

## 2022-08-12 DIAGNOSIS — M25561 Pain in right knee: Secondary | ICD-10-CM | POA: Diagnosis not present

## 2022-08-12 DIAGNOSIS — N401 Enlarged prostate with lower urinary tract symptoms: Secondary | ICD-10-CM

## 2022-08-12 MED ORDER — ALPRAZOLAM 0.25 MG PO TABS: 0.2500 mg | ORAL_TABLET | Freq: Two times a day (BID) | ORAL | 0 refills | Status: AC | PRN

## 2022-08-12 NOTE — Progress Notes (Signed)
Subjective:    Patient ID: Bobby Woods, male    DOB: 1945-08-17, 77 y.o.   MRN: [id number]  HPI Here for Medicare wellness visit and follow up of chronic health conditions Reviewed advanced directives Reviewed other doctors---Dr Lambert--EP cardiology, Dr Josie Saunders, Ms Sandridge--derm, Dr Agbor-Etang--cardiology, Dr Cleatis Polka, Dr Stoioff--urology No hospitalizations or surgery in the past year Hasn't been able to exercise lately---limited access to the pool for swimming (Y has had classes for kids). Says active at work Vision is stable--not great Hearing not so good--has had audiology evaluation but not ready for hearing aides  No alcohol or tobacco No falls No depression. Not anhedonic but stressed and limited things to enjoy Independent with instrumental ADLs No memory problems  About 2 months ago--noticed his heart sped up Noted rate as fast as 180 per minute Echo done (showed good EF) Zio monitor---now on metoprolol and symptoms have improved Will get some sensation in chest--not much. Not sure if with the fast heart. Cardiology not too concerned No SOB No dizziness or syncope No edema  Has noticed some blood when wiping after passing stool Also having some seepage  Shifted in car and now having some pain behind right knee Seems to give out at times  Mood issues lately Out of work for 6 weeks with the heart stuff Multiple things broke down in house Now wife is due for brain surgery (benign tumor causing Bell's palsy) Daughter needs sinus surgery Thinks he needs something for anxiety Not really depressed  GFR has been in high 40's-50's.  Last check was actually over 60  Current Outpatient Medications on File Prior to Visit  Medication Sig Dispense Refill   amLODipine (NORVASC) 5 MG tablet Take 1 tablet (5 mg total) by mouth daily. 90 tablet 3   clopidogrel (PLAVIX) 75 MG tablet TAKE 1 TABLET BY MOUTH EVERY DAY 90 tablet 3   finasteride  (PROSCAR) 5 MG tablet TAKE 1 TABLET BY MOUTH EVERY DAY 90 tablet 3   Magnesium Oxide 400 MG CAPS Take 1 capsule (400 mg total) by mouth daily. 30 capsule 2   metoprolol succinate (TOPROL-XL) 50 MG 24 hr tablet Take 1 tablet (50 mg total) by mouth 2 (two) times daily. Take with or immediately following a meal. 60 tablet 2   pantoprazole (PROTONIX) 40 MG tablet TAKE 1 TABLET BY MOUTH EVERY DAY 90 tablet 3   tamsulosin (FLOMAX) 0.4 MG CAPS capsule TAKE 2 CAPSULES (0.8 MG TOTAL) BY MOUTH DAILY AFTER SUPPER. 180 capsule 0   No current facility-administered medications on file prior to visit.    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/2019   Procedure: PACEMAKER IMPLANT;  Surgeon: Lanier Prude, MD;  Location: [facility];  Service: Cardiovascular;  Laterality: N/A;   PROSTATE BIOPSY  2011   negative   RETINAL DETACHMENT SURGERY  1985   left, right 05/07   ROTATOR  CUFF REPAIR  10/09   right   TONSILLECTOMY  1972    Family History  Problem Relation Age of Onset   Cancer Father        prostate   Cancer Paternal Uncle        prostate cancer   Colon cancer Neg Hx    Rectal cancer Neg Hx    Stomach cancer Neg Hx     Social History   Socioeconomic History   Marital status: Married    Spouse name: Not on file   Number of children: 3   Years of education: Not on file   Highest education level: Not on file  Occupational History   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    Employer: [employer]    Occupation: Quality assurance--cleaning    Employer: [employer]    Comment:     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  Tobacco Use   Smoking status: Never    Passive exposure: Past   Smokeless tobacco: Never  Vaping Use   Vaping status: Never Used  Substance and Sexual Activity   Alcohol use: No   Drug use: No   Sexual activity: Not on file  Other Topics Concern   Not on file  Social History Narrative   Has living will   Wife has [place] health care POA and alternate is Proofreader (cousin)   Would accept attempts at resuscitation but no prolonged artificial ventilation   Not prolonged feeding tube   Social Determinants of Health   Financial Resource Strain: Not on file  Food Insecurity: Not on file  Transportation Needs: Not on file  Physical Activity: Not on file  Stress: Not on file  Social Connections: Not on file  Intimate Partner Violence: Not on file   Review of Systems Appetite is fine Weight stable Sleep is limited by nocturia x 4. Can have some urgency (off myrbetriq). Urine flow is okay Wears seat belt Teeth are okay---keeps up with dentist No heartburn or dysphagia Some other joint pains--hip/back/leg---relates to nerve issues in back (got epidural injections in the past) No suspicious skin lesions    Objective:   Physical Exam Constitutional:      Appearance: Normal appearance.  HENT:     Mouth/Throat:     Pharynx: No oropharyngeal exudate or posterior oropharyngeal erythema.  Eyes:     Conjunctiva/sclera: Conjunctivae normal.     Comments: Right exotropia Dilated left pupil  Cardiovascular:     Rate and Rhythm: Normal rate and regular rhythm.     Pulses: Normal pulses.     Heart sounds: No murmur heard.    No gallop.  Pulmonary:     Effort: Pulmonary effort is normal.     Breath sounds: Normal breath sounds. No wheezing or rales.  Abdominal:     Palpations: Abdomen is soft.     Tenderness: There is no  abdominal tenderness.  Musculoskeletal:     Cervical back: Neck supple.     Right lower leg: No edema.     Left lower leg: No edema.     Comments: Right knee-no mass, effusion, ligament or meniscus findings  Lymphadenopathy:     Cervical: No cervical adenopathy.  Skin:    Findings: No lesion or rash.  Neurological:     General: No focal deficit present.     Mental Status: He is alert and oriented to person, place, and time.     Comments:  Word naming--5/1 minute Recall 2/3  Psychiatric:        Mood and Affect: Mood normal.        Behavior: Behavior normal.            Assessment & Plan:

## 2022-08-12 NOTE — Assessment & Plan Note (Signed)
Mostly reactive---more anxiety and no persistent depression Will give small amount xanax 0.25 for prn use

## 2022-08-12 NOTE — Assessment & Plan Note (Signed)
No clear findings Will set up with Dr Patsy Lager

## 2022-08-12 NOTE — Assessment & Plan Note (Signed)
Has pacer Now has needed metoprolol to control tachycardia

## 2022-08-12 NOTE — Assessment & Plan Note (Signed)
I have personally reviewed the Medicare Annual Wellness questionnaire and have noted 1. The patient's medical and social history 2. Their use of alcohol, tobacco or illicit drugs 3. Their current medications and supplements 4. The patient's functional ability including ADL's, fall risks, home safety risks and hearing or visual             impairment. 5. Diet and physical activities 6. Evidence for depression or mood disorders  The patients weight, height, BMI and visual acuity have been recorded in the chart I have made referrals, counseling and provided education to the patient based review of the above and I have provided the pt with a written personalized care plan for preventive services.  I have provided you with a copy of your personalized plan for preventive services. Please take the time to review along with your updated medication list.  Will set up with Dr Marina Goodell due to bowel changes (to decide if one last colon needed) No PSA due to age Plans to get back to swimming Update COVID/flu in fall Consider shingrix One time RSV before the winter

## 2022-08-12 NOTE — Assessment & Plan Note (Signed)
Last GFR was better Will hold off on labs today

## 2022-08-12 NOTE — Assessment & Plan Note (Signed)
Controlled with pantoprazole 

## 2022-08-12 NOTE — Progress Notes (Signed)
Pt scheduled to see Dr. Marina Goodell 08/29/22 at 4pm. Pt aware of appt.

## 2022-08-12 NOTE — Assessment & Plan Note (Signed)
Frequent nocturia and some irritable bladder Continues on tamsulosin and finasteride

## 2022-08-12 NOTE — Assessment & Plan Note (Signed)
BP Readings from Last 3 Encounters:  08/12/22 112/82  07/16/22 110/80  06/23/22 110/70   Controlled with amlodipine 5 daily, metoprolol 50 bid now

## 2022-08-12 NOTE — Progress Notes (Signed)
Hearing Screening - Comments:: Did not pass whisper test Vision Screening - Comments:: January 2024

## 2022-08-15 NOTE — Telephone Encounter (Signed)
Forms and concerns brought to NP to review. Unfortunately with the wording on the paper being incapacitated with a single continuous time frame- he did not meet these requirements. Patient aware, he will notify his job.   Thanks!

## 2022-08-15 NOTE — Telephone Encounter (Signed)
Pt is waiting in lobby, brought by previously completed forms that need to be updated with date range by Sherie Don, NP. Please advise.

## 2022-08-19 ENCOUNTER — Other Ambulatory Visit: Payer: Self-pay | Admitting: Internal Medicine

## 2022-08-19 ENCOUNTER — Other Ambulatory Visit: Payer: Self-pay | Admitting: Cardiology

## 2022-08-20 NOTE — Progress Notes (Signed)
T. , MD, CAQ Sports Medicine [facility] [address] [place], [address]  Phone: [phone]  FAX: [phone]  Bobby Woods - 77 y.o. male  MRN [id number]  Date of Birth: [birth year]  Date: 08/21/2022  PCP: Karie Schwalbe, MD  Referral: Karie Schwalbe, MD  Cc: R knee pain  Subjective:   Bobby Woods is a 77 y.o. very pleasant male patient with Body mass index is 31.85 kg/m. who presents with the following:  Pleasant patient presents with some ongoing acute right knee pain referred courtesy of Dr. Alphonsus Sias. No priors to R knee  A couple of weeks, then the back of his leg had some excruciating pain.  In the yard, will have some severe pain at the back of his leg.   Then the pain went away.  It has come back to a lesser degree.  Feeling pain in the back of his R leg.  Pain is really more in the proximal calf.  No thigh pain.  No back pain, radicular pain, or tingling.  Hard to say what hurts it the most, but it is posterior  He has not had an effusion.  No locking up of the joint, no significant giving way.  He does not have any history of a major knee injury, no fractures, no operative intervention.  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   Plavix  Review of Systems is noted in the HPI, as appropriate  Objective:   BP 110/68 (BP Location: Left Arm, Patient Position: Sitting, Cuff Size: Normal)   Pulse 88   Temp 97.8 F (36.6 C) (Temporal)   Ht 5' 3.75" (1.619 m)   Wt 184 lb 2 oz (83.5 kg)   SpO2 97%   BMI 31.85 kg/m   GEN: No acute distress; alert,appropriate. PULM: Breathing comfortably in no respiratory distress PSYCH: Normally interactive.   Right knee: Full extension.  Flexion to 120. No effusion No significant pain with loading the medial and lateral patellar facets, and there is fairly good motion at the patella Minimal medial joint line tenderness and minimal to no lateral joint  line tenderness Stable to varus and valgus stress ACL and PCL are intact No pain with forced extension or flexion No pain along the hamstring When evaluated in isolation, the patient predominately has pain along the proximal lateral gastrocnemius tendon and muscle belly  Laboratory and Imaging Data:  Assessment and Plan:     ICD-10-CM   1. Gastrocnemius strain, right, initial encounter  S86.111A     2. Acute pain of right knee  M25.561     3. Platelet inhibition due to Plavix  Z79.02      His knee pain is extra-articular, and seems to correlate with lateral gastrocnemius proximal pain.  Suspect he partially tore his proximal lateral gastroc, plus or minus tendon.  Would not suspect full-thickness tear.  I gave him some basic rehab to do, and we are also going to do some topical Voltaren gel.  Avoid NSAIDs, given Plavix.  Patient Instructions  Voltaren 1% gel, over the counter You can apply up to 4 times a day  This can be applied to any joint: knee, wrist, fingers, elbows, shoulders, feet and ankles. Can apply to any tendon: tennis elbow, achilles, tendon, rotator cuff or any other tendon.  Minimal is absorbed in the bloodstream: ok with oral anti-inflammatory or a blood thinner.  Cost is about 9 dollars  Medication Management during today's office visit: No orders of the defined types were placed in this encounter.  There are no discontinued medications.  Orders placed today for conditions managed today: No orders of the defined types were placed in this encounter.   Disposition: No follow-ups on file.  Dragon [employer] One speech-to-text software was used for transcription in this dictation.  Possible transcriptional errors can occur using Animal nutritionist.   Signed,  Elpidio Galea. , MD   Outpatient Encounter Medications as of 08/21/2022  Medication Sig   ALPRAZolam (XANAX) 0.25 MG tablet Take 1 tablet (0.25 mg total) by mouth 2 (two) times daily as needed  for anxiety.   amLODipine (NORVASC) 5 MG tablet Take 1 tablet (5 mg total) by mouth daily.   clopidogrel (PLAVIX) 75 MG tablet TAKE 1 TABLET BY MOUTH EVERY DAY   finasteride (PROSCAR) 5 MG tablet TAKE 1 TABLET BY MOUTH EVERY DAY   Magnesium Oxide 400 MG CAPS Take 1 capsule (400 mg total) by mouth daily.   metoprolol succinate (TOPROL-XL) 50 MG 24 hr tablet TAKE 1 TABLET (50 MG TOTAL) BY MOUTH TWICE A DAY .TAKE WITH OR IMMEDIATELY FOLLOWING A MEAL   pantoprazole (PROTONIX) 40 MG tablet TAKE 1 TABLET BY MOUTH EVERY DAY   tamsulosin (FLOMAX) 0.4 MG CAPS capsule TAKE 2 CAPSULES (0.8 MG TOTAL) BY MOUTH DAILY AFTER SUPPER.   No facility-administered encounter medications on file as of 08/21/2022.

## 2022-08-21 ENCOUNTER — Encounter: Payer: Self-pay | Admitting: Family Medicine

## 2022-08-21 ENCOUNTER — Ambulatory Visit: Payer: Medicare HMO | Admitting: Family Medicine

## 2022-08-21 VITALS — BP 110/68 | HR 88 | Temp 97.8°F | Ht 63.75 in | Wt 184.1 lb

## 2022-08-21 DIAGNOSIS — S86111A Strain of other muscle(s) and tendon(s) of posterior muscle group at lower leg level, right leg, initial encounter: Secondary | ICD-10-CM

## 2022-08-21 DIAGNOSIS — M25561 Pain in right knee: Secondary | ICD-10-CM | POA: Diagnosis not present

## 2022-08-21 DIAGNOSIS — Z7902 Long term (current) use of antithrombotics/antiplatelets: Secondary | ICD-10-CM | POA: Diagnosis not present

## 2022-08-21 NOTE — Patient Instructions (Signed)
Voltaren 1% gel, over the counter ?You can apply up to 4 times a day ? ?This can be applied to any joint: knee, wrist, fingers, elbows, shoulders, feet and ankles. ?Can apply to any tendon: tennis elbow, achilles, tendon, rotator cuff or any other tendon. ? ?Minimal is absorbed in the bloodstream: ok with oral anti-inflammatory or a blood thinner. ? ?Cost is about 9 dollars  ?

## 2022-08-29 ENCOUNTER — Encounter: Payer: Self-pay | Admitting: Internal Medicine

## 2022-08-29 ENCOUNTER — Ambulatory Visit: Payer: Medicare HMO | Admitting: Internal Medicine

## 2022-08-29 VITALS — BP 110/70 | HR 76 | Ht 63.75 in | Wt 181.0 lb

## 2022-08-29 DIAGNOSIS — K649 Unspecified hemorrhoids: Secondary | ICD-10-CM | POA: Diagnosis not present

## 2022-08-29 DIAGNOSIS — K625 Hemorrhage of anus and rectum: Secondary | ICD-10-CM | POA: Diagnosis not present

## 2022-08-29 DIAGNOSIS — Z86010 Personal history of colonic polyps: Secondary | ICD-10-CM | POA: Diagnosis not present

## 2022-08-29 DIAGNOSIS — Z7902 Long term (current) use of antithrombotics/antiplatelets: Secondary | ICD-10-CM | POA: Diagnosis not present

## 2022-08-29 MED ORDER — HYDROCORTISONE (PERIANAL) 2.5 % EX CREA: 1.0000 | TOPICAL_CREAM | Freq: Every day | CUTANEOUS | 1 refills | Status: AC

## 2022-08-29 NOTE — Progress Notes (Signed)
HISTORY OF PRESENT ILLNESS:  Bobby Woods is a 77 y.o. male with multiple significant medical problems as listed below.  He is on chronic Plavix.  He presents today regarding rectal bleeding.  Last seen in the office October 08, 2020 regarding a history of colon polyps and possible surveillance colonoscopy.  See that dictation.  Decided against surveillance due to age and comorbidities.  Tells me that he has had intermittent rectal bleeding for 6 to 12 months.  Typically red blood on the tissue.  Often associated with some mild rectal discomfort.  No change in bowel habits.  He does have occasional rectal seepage.  Blood work from Jun 02, 2022 shows a normal hemoglobin of 14.6.  Last complete colonoscopy 2016 with diminutive adenomas.  2 prior examinations were negative for neoplasia.  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 He has been exposed to tobacco smoke. He has never used smokeless tobacco. He reports that he does not drink alcohol and does not use drugs.  family history includes Cancer in his  father and paternal uncle.  Allergies  Allergen Reactions   Amoxicillin Diarrhea   Azithromycin Dihydrate Rash    GI upset       PHYSICAL EXAMINATION: Vital signs: BP 110/70 (BP Location: Left Arm, Patient Position: Sitting, Cuff Size: Normal)   Pulse 76   Ht 5' 3.75" (1.619 m)   Wt 181 lb (82.1 kg)   BMI 31.31 kg/m   Constitutional: Chronically ill-appearing, no acute distress Psychiatric: alert and oriented x3, cooperative Eyes: extraocular movements intact, anicteric, conjunctiva pink Mouth: oral pharynx moist, no lesions Neck: supple no lymphadenopathy Cardiovascular: heart regular rate and rhythm. Lungs: clear to auscultation bilaterally Abdomen: soft, nontender, nondistended, no obvious ascites, no peritoneal signs, normal bowel sounds, no organomegaly Rectal: No external abnormalities.  No mass.  Moderate internal hemorrhoids.  Brown Hemoccult negative stool.  Good tone Extremities: no clubbing, cyanosis, or lower extremity edema bilaterally Skin: no lesions on visible extremities Neuro: No focal deficits.  Cranial nerves intact  ASSESSMENT:  1.  Intermittent minor rectal bleeding due to hemorrhoids 2.  Rectal seepage 3.  Multiple significant medical problems   PLAN:  1.  Anusol HC suppositories (or equivalent).  1 PR nightly.  #30.  Several refills 2.  Citrucel 2 tablespoons daily 3.  Office follow-up 6 weeks Total time of 30 minutes was spent preparing to see the patient, obtaining comprehensive history, performing medically appropriate physical exam, counseling  educating the patient regarding the above listed issues, ordering medication, arranging follow-up, and documenting clinical information in the health record

## 2022-08-29 NOTE — Patient Instructions (Signed)
We have sent the following medications to your pharmacy for you to pick up at your convenience:  Anusol HC cream.  Purchase over the counter Preparation H suppositories.  Apply a pea size amount of the above cream and insert rectally at bedime.  Take 2 tablespoons of Citrucel daily.  _______________________________________________________  If your blood pressure at your visit was 140/90 or greater, please contact your primary care physician to follow up on this.  _______________________________________________________  If you are age 57 or older, your body mass index should be between 23-30. Your Body mass index is 31.31 kg/m. If this is out of the aforementioned range listed, please consider follow up with your Primary Care Provider.  If you are age 76 or younger, your body mass index should be between 19-25. Your Body mass index is 31.31 kg/m. If this is out of the aformentioned range listed, please consider follow up with your Primary Care Provider.   ________________________________________________________  The  GI providers would like to encourage you to use [facility] to communicate with providers for non-urgent requests or questions.  Due to long hold times on the telephone, sending your provider a message by [facility] may be a faster and more efficient way to get a response.  Please allow 48 business hours for a response.  Please remember that this is for non-urgent requests.  _______________________________________________________

## 2022-09-09 ENCOUNTER — Other Ambulatory Visit: Payer: Self-pay | Admitting: Internal Medicine

## 2022-09-12 ENCOUNTER — Ambulatory Visit: Payer: Medicare HMO

## 2022-09-12 DIAGNOSIS — I495 Sick sinus syndrome: Secondary | ICD-10-CM

## 2022-09-13 LAB — CUP PACEART REMOTE DEVICE CHECK
Battery Remaining Longevity: 122 mo
Battery Voltage: 3.01 V
Brady Statistic AP VP Percent: 0.1 %
Brady Statistic AP VS Percent: 99.23 %
Brady Statistic AS VP Percent: 0 %
Brady Statistic AS VS Percent: 0.67 %
Brady Statistic RA Percent Paced: 99.33 %
Brady Statistic RV Percent Paced: 0.1 %
Date Time Interrogation Session: 20240830034343
Implantable Lead Connection Status: 753985
Implantable Lead Connection Status: 753985
Implantable Lead Implant Date: 20210903
Implantable Lead Implant Date: 20210903
Implantable Lead Location: 753859
Implantable Lead Location: 753860
Implantable Lead Model: 5076
Implantable Lead Model: 5076
Implantable Pulse Generator Implant Date: 20210903
Lead Channel Impedance Value: 285 Ohm
Lead Channel Impedance Value: 304 Ohm
Lead Channel Impedance Value: 323 Ohm
Lead Channel Impedance Value: 361 Ohm
Lead Channel Pacing Threshold Amplitude: 0.625 V
Lead Channel Pacing Threshold Amplitude: 0.75 V
Lead Channel Pacing Threshold Pulse Width: 0.4 ms
Lead Channel Pacing Threshold Pulse Width: 0.4 ms
Lead Channel Sensing Intrinsic Amplitude: 2.25 mV
Lead Channel Sensing Intrinsic Amplitude: 2.25 mV
Lead Channel Sensing Intrinsic Amplitude: 6.75 mV
Lead Channel Sensing Intrinsic Amplitude: 6.75 mV
Lead Channel Setting Pacing Amplitude: 1.5 V
Lead Channel Setting Pacing Amplitude: 2 V
Lead Channel Setting Pacing Pulse Width: 0.4 ms
Lead Channel Setting Sensing Sensitivity: 1.2 mV
Zone Setting Status: 755011
Zone Setting Status: 755011

## 2022-09-16 NOTE — Progress Notes (Signed)
Remote pacemaker transmission.   

## 2022-09-19 ENCOUNTER — Telehealth: Payer: Self-pay | Admitting: Cardiology

## 2022-09-19 NOTE — Telephone Encounter (Signed)
Patient called and said that he can fill his heart rate going up again and becoming more tired. Wanted to know if the need to go up on metoprolol succinate (TOPROL-XL) 25 MG 24 hr tablet again

## 2022-09-19 NOTE — Telephone Encounter (Signed)
Pt called stating he is feeling more tachycardia episodes. He stated symptoms typically don't last long, but occur more frequent. No vitals to report. He denies feeling light headed or feeling like he's going to pass. However, he stated "I just feel more tired."  Pt inquiring if provider feels he should increase his metoprolol.   Nurse recommended pt keep a log of blood pressure and heart rate.  Will forward to NP for review and further recommendations.

## 2022-09-23 DIAGNOSIS — C44529 Squamous cell carcinoma of skin of other part of trunk: Secondary | ICD-10-CM | POA: Diagnosis not present

## 2022-09-23 DIAGNOSIS — D225 Melanocytic nevi of trunk: Secondary | ICD-10-CM | POA: Diagnosis not present

## 2022-09-23 DIAGNOSIS — L821 Other seborrheic keratosis: Secondary | ICD-10-CM | POA: Diagnosis not present

## 2022-09-23 DIAGNOSIS — Z85828 Personal history of other malignant neoplasm of skin: Secondary | ICD-10-CM | POA: Diagnosis not present

## 2022-09-23 DIAGNOSIS — D2261 Melanocytic nevi of right upper limb, including shoulder: Secondary | ICD-10-CM | POA: Diagnosis not present

## 2022-09-23 DIAGNOSIS — D2262 Melanocytic nevi of left upper limb, including shoulder: Secondary | ICD-10-CM | POA: Diagnosis not present

## 2022-09-23 DIAGNOSIS — C44519 Basal cell carcinoma of skin of other part of trunk: Secondary | ICD-10-CM | POA: Diagnosis not present

## 2022-09-23 DIAGNOSIS — D2271 Melanocytic nevi of right lower limb, including hip: Secondary | ICD-10-CM | POA: Diagnosis not present

## 2022-09-23 DIAGNOSIS — D2272 Melanocytic nevi of left lower limb, including hip: Secondary | ICD-10-CM | POA: Diagnosis not present

## 2022-09-23 DIAGNOSIS — Z08 Encounter for follow-up examination after completed treatment for malignant neoplasm: Secondary | ICD-10-CM | POA: Diagnosis not present

## 2022-09-23 NOTE — Telephone Encounter (Signed)
Called patient, LVM advising of message below.  Left call back number.

## 2022-09-30 ENCOUNTER — Telehealth: Payer: Self-pay | Admitting: Internal Medicine

## 2022-09-30 NOTE — Telephone Encounter (Signed)
Spoke with patient who was concerned because he had some abdominal pain and vomiting this weekend.  He was concerned it was the Citrucel.  He has been taking this for a month with no trouble so I told him he might have a virus - to take a break from the Citrucel and restart it in a few days.  He also reported some leakage from the suppositories.  I told him to wear protective undergarments if he was worried about it and he could talk to Dr. Marina Goodell further at his appointment next week.  Patient agreed.

## 2022-09-30 NOTE — Telephone Encounter (Signed)
Inbound call from patient would like to speak to [place], states he has some unexpected issues and questions, patient did not want to explain further. Please advise.

## 2022-10-01 ENCOUNTER — Encounter: Payer: Self-pay | Admitting: Internal Medicine

## 2022-10-01 ENCOUNTER — Ambulatory Visit (INDEPENDENT_AMBULATORY_CARE_PROVIDER_SITE_OTHER): Payer: Medicare HMO | Admitting: Internal Medicine

## 2022-10-01 VITALS — BP 118/82 | HR 77 | Temp 98.0°F | Ht 63.75 in | Wt 182.0 lb

## 2022-10-01 DIAGNOSIS — Z23 Encounter for immunization: Secondary | ICD-10-CM

## 2022-10-01 DIAGNOSIS — M7918 Myalgia, other site: Secondary | ICD-10-CM | POA: Diagnosis not present

## 2022-10-01 NOTE — Patient Instructions (Signed)
Try a heating pad at times Tylenol 650 or 1000mg  three times a day You can try topical diclofenac gel and lidocaine gel or patches as well

## 2022-10-01 NOTE — Addendum Note (Signed)
Addended by: Eual Fines on: 10/01/2022 01:05 PM   Modules accepted: Orders

## 2022-10-01 NOTE — Assessment & Plan Note (Signed)
Reassured--nothing to suggest serious injury like HNP Likely injured at work Discussed safe lifting, etc Tylenol 650 or 1000 tid Can use heat---and topical diclofenac/lidocaine

## 2022-10-01 NOTE — Addendum Note (Signed)
Addended by: Tillman Abide I on: 10/01/2022 12:56 PM   Modules accepted: Level of Service

## 2022-10-01 NOTE — Progress Notes (Signed)
Subjective:    Patient ID: Bobby Woods, male    DOB: [birth year], 77 y.o.   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 20 tablet 0   amLODipine (NORVASC) 5 MG tablet Take 1 tablet (5 mg total) by mouth daily. 90 tablet 3   clopidogrel (PLAVIX) 75 MG tablet TAKE 1 TABLET BY MOUTH EVERY DAY 90 tablet 3   finasteride (PROSCAR) 5 MG tablet TAKE 1 TABLET BY MOUTH EVERY DAY 90 tablet 3   hydrocortisone (ANUSOL-HC) 2.5 % rectal cream Place 1 Application rectally at bedtime. 30 g 1   Magnesium Oxide 400 MG CAPS Take 1 capsule (400 mg total) by mouth daily. 30 capsule 2   metoprolol succinate (TOPROL-XL) 25 MG 24 hr tablet Take 50 mg by mouth 2 (two) times daily.     pantoprazole (PROTONIX) 40 MG tablet TAKE 1 TABLET BY MOUTH EVERY DAY 90 tablet 3   tamsulosin (FLOMAX) 0.4 MG CAPS capsule TAKE 2 CAPSULES (0.8 MG TOTAL) BY MOUTH DAILY AFTER SUPPER. 180 capsule 3   No current facility-administered medications on file prior to visit.    Allergies  Allergen Reactions   Amoxicillin Diarrhea   Azithromycin Dihydrate Rash    GI upset    Past Medical History:  Diagnosis Date   Arthritis    Blood transfusion without reported diagnosis    age 89 year    BPH (benign prostatic hypertrophy)    Chronic kidney disease    GERD  (gastroesophageal reflux disease)    Hypertension    on medicines     Past Surgical History:  Procedure Laterality Date   COLONOSCOPY  2006   HAND SURGERY     INGUINAL HERNIA REPAIR  1965   PACEMAKER IMPLANT N/A 09/16/2019   Procedure: PACEMAKER IMPLANT;  Surgeon: Lanier Prude, MD;  [facility];  Service: Cardiovascular;  Laterality: N/A;   PROSTATE BIOPSY  2011   negative   RETINAL DETACHMENT SURGERY  1985   left, right 05/07   ROTATOR CUFF REPAIR  10/09   right   TONSILLECTOMY  1972    Family History  Problem Relation Age of Onset   Cancer Father        prostate   Cancer Paternal Uncle        prostate cancer   Colon cancer Neg Hx    Rectal cancer Neg Hx    Stomach cancer Neg Hx     Social History   Socioeconomic History   Marital status: Married    Spouse name: Not on file   Number of children: 3   Years of education: Not on file   Highest education level: Not on file  Occupational History   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  Employer: [employer]   Occupation: Quality assurance--cleaning    Employer: [employer]    Comment:     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  Tobacco Use   Smoking status: Never    Passive exposure: Past   Smokeless tobacco: Never  Vaping Use   Vaping status: Never Used  Substance and Sexual Activity   Alcohol use: No   Drug use: No   Sexual activity: Not on file  Other Topics Concern   Not on file  Social History Narrative   Has living will   Wife has [place] health care POA and alternate is Proofreader (cousin)   Would accept attempts at resuscitation but no prolonged artificial ventilation   Not prolonged feeding tube   Social Determinants of Health   Financial Resource Strain: Not on file  Food Insecurity: Not on file  Transportation Needs: Not on file  Physical Activity: Not on file  Stress: Not on file  Social Connections: Not on file  Intimate  Partner Violence: Not on file   Review of Systems No change in sensation in groin No loss of bladder or bowel function     Objective:   Physical Exam Constitutional:      Appearance: Normal appearance.  Musculoskeletal:     Comments: Tenderness along upper lumbar right paraspinals No spine tenderness SLR negative Normal ROM of hips  Neurological:     Mental Status: He is alert.     Comments: Slightly antalgic gait No leg weakness            Assessment & Plan:

## 2022-10-07 ENCOUNTER — Ambulatory Visit: Payer: Medicare HMO | Admitting: Internal Medicine

## 2022-10-07 ENCOUNTER — Encounter: Payer: Self-pay | Admitting: Internal Medicine

## 2022-10-07 VITALS — BP 120/76 | HR 72 | Ht 63.0 in | Wt 182.0 lb

## 2022-10-07 DIAGNOSIS — K625 Hemorrhage of anus and rectum: Secondary | ICD-10-CM | POA: Diagnosis not present

## 2022-10-07 DIAGNOSIS — Z86010 Personal history of colonic polyps: Secondary | ICD-10-CM

## 2022-10-07 DIAGNOSIS — K649 Unspecified hemorrhoids: Secondary | ICD-10-CM | POA: Diagnosis not present

## 2022-10-07 NOTE — Progress Notes (Signed)
HISTORY OF PRESENT ILLNESS:  Bobby Woods is a 77 y.o. male with multiple medical problems who presents today for follow-up regarding problems with intermittent minor rectal bleeding and rectal seepage.  He was seen in the office August 29, 2022 for the same.  Physical examination revealed moderate internal hemorrhoids with brown Hemoccult negative stool.  He was treated with a combination of medicated suppositories, hemorrhoidal cream, and fiber supplementation in the form of Citrucel.  After several weeks he did well.  He stopped suppositories and cream.  At some point thereafter he developed some recurrent minor bleeding.  While on fiber supplementation his rectal seepage was less.  He stopped the fiber after having had an episode of nausea and vomiting.  He was not sure if this was related to fiber and wants to wait until his follow-up appointment at this time.  No new complaints.  As previously mentioned he has had multiple prior colonoscopies.  Mostly normal.  Last 1 with diminutive adenomas 2016.  He remains on chronic Plavix  REVIEW OF SYSTEMS:  All non-GI ROS negative except for  Past Medical History:  Diagnosis Date   Arthritis    Blood transfusion without reported diagnosis    age 32 year    BPH (benign prostatic hypertrophy)    Chronic kidney disease    GERD (gastroesophageal reflux disease)    Hypertension    on medicines     Past Surgical History:  Procedure Laterality Date   COLONOSCOPY  2006   HAND SURGERY     INGUINAL HERNIA REPAIR  1965   PACEMAKER IMPLANT N/A 09/16/2019   Procedure: PACEMAKER IMPLANT;  Surgeon: Lanier Prude, MD;  Location: [facility];  Service: Cardiovascular;  Laterality: N/A;   PROSTATE BIOPSY  2011   negative   RETINAL DETACHMENT SURGERY  1985   left, right 05/07   ROTATOR CUFF REPAIR  10/09   right   TONSILLECTOMY  1972    Social History Bobby Woods  reports that he has never smoked. He has been exposed to tobacco smoke. He  has never used smokeless tobacco. He reports that he does not drink alcohol and does not use drugs.  family history includes Cancer in his father and paternal uncle.  Allergies  Allergen Reactions   Amoxicillin Diarrhea   Azithromycin Dihydrate Rash    GI upset       PHYSICAL EXAMINATION: Vital signs: BP 120/76   Pulse 72   Ht 5\' 3"  (1.6 m)   Wt 182 lb (82.6 kg)   BMI 32.24 kg/m   Constitutional: generally well-appearing, no acute distress Psychiatric: alert and oriented x3, cooperative Eyes: extraocular movements intact, anicteric, conjunctiva pink Mouth: oral pharynx moist, no lesions Neck: supple no lymphadenopathy Cardiovascular: heart regular rate and rhythm, no murmur Lungs: clear to auscultation bilaterally Abdomen: soft, nontender, nondistended, no obvious ascites, no peritoneal signs, normal bowel sounds, no organomegaly Rectal: Not reexamined Extremities: no clubbing, cyanosis, or lower extremity edema bilaterally Skin: no lesions on visible extremities Neuro: No focal deficits.  Cranial nerves intact  ASSESSMENT:  1.  Minor rectal bleeding due to hemorrhoids 2.  Minor rectal seepage 3.  History of adenomatous 4.  Medical problems, on Plavix   PLAN:  1.  Resume medicated suppositories and topical cream 2.  Reinitiate Citrucel therapy 3.  Office follow-up in 2 months. 4.  I did discuss with him in office banding procedure and provided him with a brochure.  He may be a  candidate if he does not respond to medical treatment.

## 2022-10-07 NOTE — Patient Instructions (Signed)
Please follow up in 2 months.  _______________________________________________________  If your blood pressure at your visit was 140/90 or greater, please contact your primary care physician to follow up on this.  _______________________________________________________  If you are age 77 or older, your body mass index should be between 23-30. Your Body mass index is 32.24 kg/m. If this is out of the aforementioned range listed, please consider follow up with your Primary Care Provider.  If you are age 60 or younger, your body mass index should be between 19-25. Your Body mass index is 32.24 kg/m. If this is out of the aformentioned range listed, please consider follow up with your Primary Care Provider.   ________________________________________________________  The [facility] providers would like to encourage you to use [facility] to communicate with providers for non-urgent requests or questions.  Due to long hold times on the telephone, sending your provider a message by [facility] may be a faster and more efficient way to get a response.  Please allow 48 business hours for a response.  Please remember that this is for non-urgent requests.  _______________________________________________________

## 2022-10-08 ENCOUNTER — Encounter: Payer: Medicare HMO | Admitting: Cardiology

## 2022-10-13 ENCOUNTER — Ambulatory Visit: Payer: Medicare HMO | Admitting: Internal Medicine

## 2022-10-14 DIAGNOSIS — C44519 Basal cell carcinoma of skin of other part of trunk: Secondary | ICD-10-CM | POA: Diagnosis not present

## 2022-10-16 NOTE — Progress Notes (Signed)
Cardiology Office Note Date:  10/17/2022  Patient ID:  Bobby Woods, Bobby Woods 1945/05/11, MRN [id number] PCP:  Karie Schwalbe, MD  Cardiologist:  Debbe Odea, MD Electrophysiologist: Lanier Prude, MD    Chief Complaint: 3mon PPM follow-up  History of Present Illness: Bobby Woods is a 77 y.o. male with PMH notable for parox SVT, tachy-brady s/p PPM, HTN; seen today for Lanier Prude, MD for routine electrophysiology followup.  Last saw Dr. Lalla Brothers 09/2021, was doing well without cardiac complaints. I saw him in clinic about 05/2022. He was complaining of "heart beating in throat".  He was having atrial tach episodes by device. Device adjusted to add ATP for AT/AF episodes lasting longer than 1 minute. At follow-up, he continued to complain of palpitations, toprol increased to 50mg  BID  On follow-up today, he continues to have palpitation episodes, but thinks they are significantly improved from prior. Is working more at [employer].  He brings BP log, readings are typically 120-130 after medications. Rarely 80-100 before medications - on these days he skips his norvasc and metop.  He denies chest pain, dyspnea, PND, orthopnea, nausea, vomiting, dizziness, syncope, edema, weight gain, or early satiety.     Device Information: MDT dual chamber PPM, imp 09/2019; SND  Past Medical History:  Diagnosis Date   Arthritis    Blood transfusion without reported diagnosis    age 76 year    BPH (benign prostatic hypertrophy)    Chronic kidney disease    GERD (gastroesophageal reflux disease)    Hypertension    on medicines     Past Surgical History:  Procedure Laterality Date   COLONOSCOPY  2006   HAND SURGERY     INGUINAL HERNIA REPAIR  1965   PACEMAKER IMPLANT N/A 09/16/2019   Procedure: PACEMAKER IMPLANT;  Surgeon: Lanier Prude, MD;  Location: [facility];  Service: Cardiovascular;  Laterality: N/A;   PROSTATE BIOPSY  2011    negative   RETINAL DETACHMENT SURGERY  1985   left, right 05/07   ROTATOR CUFF REPAIR  10/09   right   TONSILLECTOMY  1972    Current Outpatient Medications  Medication Instructions   ALPRAZolam (XANAX) 0.25 mg, Oral, 2 times daily PRN   amLODipine (NORVASC) 5 mg, Oral, Daily   clopidogrel (PLAVIX) 75 MG tablet TAKE 1 TABLET BY MOUTH EVERY DAY   finasteride (PROSCAR) 5 MG tablet TAKE 1 TABLET BY MOUTH EVERY DAY   hydrocortisone (ANUSOL-HC) 2.5 % rectal cream 1 Application, Rectal, Daily at bedtime   Magnesium Oxide 400 mg, Oral, Daily   metoprolol succinate (TOPROL-XL) 50 mg, Oral, 2 times daily   pantoprazole (PROTONIX) 40 MG tablet TAKE 1 TABLET BY MOUTH EVERY DAY   tamsulosin (FLOMAX) 0.8 mg, Oral, Daily after supper    Social History:  The patient  reports that he has never smoked. He has been exposed to tobacco smoke. He has never used smokeless tobacco. He reports that he does not drink alcohol and does not use drugs.   Family History:  The patient's family history includes Cancer in his father and paternal uncle.  ROS:  Please see the history of present illness. All other systems are reviewed and otherwise negative.   PHYSICAL EXAM:   Vitals:   10/17/22 1007  BP: 100/74  Pulse: 73  Height: 5\' 3"  (1.6 m)  Weight: 181 lb 2 oz (82.2 kg)  SpO2: 98%  BMI (Calculated): 32.09  GEN- The patient is well appearing, alert and oriented x 3 today.   Lungs- Clear to ausculation bilaterally, normal work of breathing.  Heart- Regular rate and rhythm, no murmurs, rubs or gallops Extremities- No peripheral edema, warm, dry Skin-   device pocket well-healed, no tethering   Device interrogation done today and reviewed by myself:  Battery 10 yrs Lead thresholds, impedence, sensing stable  High atrial pacing 99% Low VP, <1% Brief SVT episodes noted  Occasional PVC noted during threshold testing    EKG is ordered. Personal review of EKG from today shows:  EKG  Interpretation Date/Time:  Friday October 17 2022 10:11:47 EDT Ventricular Rate:  73 PR Interval:  186 QRS Duration:  80 QT Interval:  386 QTC Calculation: 425 R Axis:   -3  Text Interpretation: Atrial-paced rhythm Confirmed by Sherie Don [phone]) on 10/17/2022 10:14:32 AM    06/23/2022: A-paced, rate 70bpm; occasional PVC  Recent Labs: 06/02/2022: BUN 13; Creatinine, Ser 1.20; Hemoglobin 14.6; Platelets 174; Potassium 4.0; Sodium 138 06/23/2022: Magnesium 2.2  No results found for requested labs within last 365 days.   CrCl cannot be calculated (Patient's most recent lab result is older than the maximum 21 days allowed.).   Wt Readings from Last 3 Encounters:  10/17/22 181 lb 2 oz (82.2 kg)  10/07/22 182 lb (82.6 kg)  10/01/22 182 lb (82.6 kg)     Additional studies reviewed include: Previous EP, cardiology notes.   Long term monitor, 07/10/2022 Paroxysmal SVT noted, longest lasting 19 beats, fastest lasting 9 seconds. Overall PVCs with rare less than 1%, PACs also rare less than 1%. Symptomatic episodes correlate with PVCs and SVT episodes  TTE, 07/07/2022  1. Left ventricular ejection fraction, by estimation, is 60 to 65%. The left ventricle has normal function. The left ventricle has no regional wall motion abnormalities. There is mild left ventricular hypertrophy. Left ventricular diastolic parameters are consistent with Grade I diastolic dysfunction (impaired relaxation). The average left ventricular global longitudinal strain is -17.6 %. The global longitudinal strain is normal.   2. Right ventricular systolic function is normal. The right ventricular size is normal.   3. The mitral valve is normal in structure. Mild mitral valve regurgitation.   4. The aortic valve is tricuspid. Aortic valve regurgitation is not visualized.   5. The inferior vena cava is normal in size with greater than 50% respiratory variability, suggesting right atrial pressure of 3 mmHg.   NM  myocardial perfusion, 10/30/2020   Abnormal, probably low risk pharmacologic myocardial perfusion stress test.   There is a small in size, mild in severity, reversible defect involving the apical lateral and apical inferior segments that may reflect subtle ischemia but cannot rule out artifact.   There is no evidence of prior infarct.   Left ventricular function is hyperdynamic (LVEF > 65%).   No significant coronary artery calcification is observed.   Incidental note is made of mild aortic atherosclerosis, cardiac pacemaker, multiple calcified gallstones, and indeterminate hepatic hypodensity (question cyst as noted on prior abdominal CT in 2020).  TTE, 07/27/2019  1. Left ventricular ejection fraction, by estimation, is 60 to 65%. The left ventricle has normal function. The left ventricle has no regional wall motion abnormalities. Left ventricular diastolic parameters were normal.   2. Right ventricular systolic function is normal. The right ventricular size is mildly enlarged. There is normal pulmonary artery systolic pressure.   3. The mitral valve is normal in structure. No evidence of mitral valve regurgitation.  4. The aortic valve is tricuspid. Aortic valve regurgitation is not visualized.   5. The inferior vena cava is normal in size with greater than 50% respiratory variability, suggesting right atrial pressure of 3 mmHg.    ASSESSMENT AND PLAN:  #) Tachy-brady syndrome s/p MDT PPM  #) palpitations #) PACs #) PVCs Vast majority of symptomatic episodes on 3 day monitor correlate with single PVC Few SVT episodes on monitor and by device Symptomatic improvement as of late Continue toprol 50mg  BID Continue magnesium supplement  #) HTN Normotensive in office Continue to monitor at home and bring measurements to follow-up appts Continue 5mg  amlodipine Cont BB as above   Current medicines are reviewed at length with the patient today.   The patient does not have concerns  regarding his medicines.  The following changes were made today:   INCREASE toprol to 50mg  BID START mag ox 400mg  daily   Labs/ tests ordered today include:  Orders Placed This Encounter  Procedures   EKG 12-Lead     Disposition: Follow up with EP APP in 3 months   Signed, Sherie Don, NP  10/17/22  10:14 AM  Electrophysiology [facility]

## 2022-10-17 ENCOUNTER — Encounter: Payer: Self-pay | Admitting: Cardiology

## 2022-10-17 ENCOUNTER — Ambulatory Visit: Payer: Medicare HMO | Attending: Cardiology | Admitting: Cardiology

## 2022-10-17 VITALS — BP 100/74 | HR 73 | Ht 63.0 in | Wt 181.1 lb

## 2022-10-17 DIAGNOSIS — I493 Ventricular premature depolarization: Secondary | ICD-10-CM

## 2022-10-17 DIAGNOSIS — I495 Sick sinus syndrome: Secondary | ICD-10-CM

## 2022-10-17 DIAGNOSIS — Z950 Presence of cardiac pacemaker: Secondary | ICD-10-CM

## 2022-10-17 DIAGNOSIS — I10 Essential (primary) hypertension: Secondary | ICD-10-CM

## 2022-10-17 LAB — CUP PACEART INCLINIC DEVICE CHECK
Date Time Interrogation Session: 20241004104224
Implantable Lead Connection Status: 753985
Implantable Lead Connection Status: 753985
Implantable Lead Implant Date: 20210903
Implantable Lead Implant Date: 20210903
Implantable Lead Location: 753859
Implantable Lead Location: 753860
Implantable Lead Model: 5076
Implantable Lead Model: 5076
Implantable Pulse Generator Implant Date: 20210903

## 2022-10-17 MED ORDER — MAGNESIUM OXIDE 400 MG PO CAPS: 1.0000 | ORAL_CAPSULE | Freq: Every day | ORAL | 5 refills | Status: DC

## 2022-10-17 NOTE — Patient Instructions (Signed)
Medication Instructions:  The current medical regimen is effective;  continue present plan and medications as directed. Please refer to the Current Medication list given to you today.   *If you need a refill on your cardiac medications before your next appointment, please call your pharmacy*   Follow-Up: At [facility], you and your health needs are our priority.  As part of our continuing mission to provide you with exceptional heart care, we have created designated Provider Care Teams.  These Care Teams include your primary Cardiologist (physician) and Advanced Practice Providers (APPs -  Physician Assistants and Nurse Practitioners) who all work together to provide you with the care you need, when you need it.  We recommend signing up for the patient portal called "MyChart".  Sign up information is provided on this After Visit Summary.  MyChart is used to connect with patients for Virtual Visits (Telemedicine).  Patients are able to view lab/test results, encounter notes, upcoming appointments, etc.  Non-urgent messages can be sent to your provider as well.   To learn more about what you can do with MyChart, go to [email or web address].    Your next appointment:   6 month(s)  Provider:   Steffanie Dunn, MD or Sherie Don, NP

## 2022-10-18 ENCOUNTER — Other Ambulatory Visit: Payer: Self-pay | Admitting: Cardiology

## 2022-10-20 NOTE — Telephone Encounter (Signed)
07/16/22 office visit with Bobby Woods: INCREASE toprol to 50mg  BID  10/17/2022 Suzann Woods office note:  Continue toprol 50mg  BID

## 2022-11-03 ENCOUNTER — Ambulatory Visit: Payer: Medicare HMO | Admitting: Internal Medicine

## 2022-11-03 ENCOUNTER — Encounter: Payer: Self-pay | Admitting: Internal Medicine

## 2022-11-03 VITALS — BP 112/84 | HR 73 | Temp 97.6°F | Ht 63.0 in | Wt 181.0 lb

## 2022-11-03 DIAGNOSIS — C44519 Basal cell carcinoma of skin of other part of trunk: Secondary | ICD-10-CM

## 2022-11-03 DIAGNOSIS — J069 Acute upper respiratory infection, unspecified: Secondary | ICD-10-CM | POA: Insufficient documentation

## 2022-11-03 DIAGNOSIS — C4491 Basal cell carcinoma of skin, unspecified: Secondary | ICD-10-CM | POA: Insufficient documentation

## 2022-11-03 MED ORDER — BENZONATATE 200 MG PO CAPS: 200.0000 mg | ORAL_CAPSULE | Freq: Three times a day (TID) | ORAL | 0 refills | Status: DC | PRN

## 2022-11-03 NOTE — Assessment & Plan Note (Signed)
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

## 2022-11-03 NOTE — Assessment & Plan Note (Signed)
COVID negative Some sinus symptoms--but still seems to be viral now Benzonatate Rx--tylenol If worsens later in the week---will send doxy 100 bid x 7 days

## 2022-11-03 NOTE — Progress Notes (Signed)
Subjective:    Patient ID: Bobby Woods, male    DOB: 01-24-1945, 77 y.o.   MRN: [id number]  HPI Here due to respiratory infection  Sick for 5 days  Some sore throat and cough Some frontal pressure Runny nose and sneezing Sinus pressure and sore throat are better No ear pain No fever, chills or sweats No SOB  Tried cough drops Hasn't missed work  Current Outpatient Medications on File Prior to Visit  Medication Sig Dispense Refill   ALPRAZolam (XANAX) 0.25 MG tablet Take 1 tablet (0.25 mg total) by mouth 2 (two) times daily as needed for anxiety. 20 tablet 0   amLODipine (NORVASC) 5 MG tablet Take 1 tablet (5 mg total) by mouth daily. 90 tablet 3   clopidogrel (PLAVIX) 75 MG tablet TAKE 1 TABLET BY MOUTH EVERY DAY 90 tablet 3   finasteride (PROSCAR) 5 MG tablet TAKE 1 TABLET BY MOUTH EVERY DAY 90 tablet 3   hydrocortisone (ANUSOL-HC) 2.5 % rectal cream Place 1 Application rectally at bedtime. 30 g 1   Magnesium Oxide 400 MG CAPS Take 1 capsule (400 mg total) by mouth daily. 30 capsule 5   metoprolol succinate (TOPROL-XL) 25 MG 24 hr tablet Take 2 tablets (50 mg total) by mouth in the morning and at bedtime. 120 tablet 5   pantoprazole (PROTONIX) 40 MG tablet TAKE 1 TABLET BY MOUTH EVERY DAY 90 tablet 3   tamsulosin (FLOMAX) 0.4 MG CAPS capsule TAKE 2 CAPSULES (0.8 MG TOTAL) BY MOUTH DAILY AFTER SUPPER. 180 capsule 3   No current facility-administered medications on file prior to visit.    Allergies  Allergen Reactions   Amoxicillin Diarrhea   Azithromycin Dihydrate Rash    GI upset    Past Medical History:  Diagnosis Date   Arthritis    Blood transfusion without reported diagnosis    age 45 year    BPH (benign prostatic hypertrophy)    Chronic kidney disease    GERD (gastroesophageal reflux disease)    Hypertension    on medicines     Past Surgical History:  Procedure Laterality Date   COLONOSCOPY  2006   HAND SURGERY     INGUINAL HERNIA REPAIR  1965    PACEMAKER IMPLANT N/A 09/16/2019   Procedure: PACEMAKER IMPLANT;  Surgeon: Lanier Prude, MD;  [facility]: [facility];  Service: Cardiovascular;  Laterality: N/A;   PROSTATE BIOPSY  2011   negative   RETINAL DETACHMENT SURGERY  1985   left, right 05/07   ROTATOR CUFF REPAIR  10/09   right   TONSILLECTOMY  1972    Family History  Problem Relation Age of Onset   Cancer Father        prostate   Cancer Paternal Uncle        prostate cancer   Colon cancer Neg Hx    Rectal cancer Neg Hx    Stomach cancer Neg Hx     Social History   Socioeconomic History   Marital status: Married    Spouse name: Not on file   Number of children: 3   Years of education: Not on file   Highest education level: Not on file  Occupational History   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    Employer: [employer]   Occupation: Quality assurance--cleaning    Employer: [employer]    Comment:     Occupation: Custodial/funeral assistance    Comment: [employer]  Tobacco  Use   Smoking status: Never    Passive exposure: Past   Smokeless tobacco: Never  Vaping Use   Vaping status: Never Used  Substance and Sexual Activity   Alcohol use: No   Drug use: No   Sexual activity: Not on file  Other Topics Concern   Not on file  Social History Narrative   Has living will   Wife has [place] health care POA and alternate is Jeanie Sewer (cousin)   Would accept attempts at resuscitation but no prolonged artificial ventilation   Not prolonged feeding tube   Social Determinants of Health   Financial Resource Strain: Not on file  Food Insecurity: Not on file  Transportation Needs: Not on file  Physical Activity: Not on file  Stress: Not on file  Social Connections: Not on file  Intimate Partner Violence: Not on file   Review of Systems No N/V Eating okay     Objective:   Physical Exam Constitutional:      Appearance: Normal appearance.  HENT:     Head:     Comments: Mild  maxillary and frontal tenderness    Right Ear: Tympanic membrane and ear canal normal.     Left Ear: Tympanic membrane and ear canal normal.     Mouth/Throat:     Pharynx: No oropharyngeal exudate or posterior oropharyngeal erythema.  Pulmonary:     Effort: Pulmonary effort is normal.     Breath sounds: Normal breath sounds. No wheezing or rales.  Musculoskeletal:     Cervical back: Neck supple.  Lymphadenopathy:     Cervical: No cervical adenopathy.  Neurological:     Mental Status: He is alert.            Assessment & Plan:

## 2022-11-20 ENCOUNTER — Ambulatory Visit: Payer: Medicare HMO | Admitting: Internal Medicine

## 2022-11-20 ENCOUNTER — Encounter: Payer: Self-pay | Admitting: Internal Medicine

## 2022-11-20 VITALS — BP 110/80 | HR 70 | Temp 98.8°F | Ht 63.0 in | Wt 180.0 lb

## 2022-11-20 DIAGNOSIS — H938X1 Other specified disorders of right ear: Secondary | ICD-10-CM | POA: Diagnosis not present

## 2022-11-20 NOTE — Assessment & Plan Note (Signed)
No clear middle ear disease--but likely related to recent URI Reassured--looks okay If ongoing symptoms (like 2 weeks)---will set up with ENT

## 2022-11-20 NOTE — Progress Notes (Signed)
Subjective:    Patient ID: Bobby Woods, male    DOB: 28-Jun-1945, 77 y.o.   MRN: [id number]  HPI Here due to popping in his right ear Hears rattling Just started today when he got up  His recent infection is much better Cough mostly gone Only a little congestion  Current Outpatient Medications on File Prior to Visit  Medication Sig Dispense Refill   ALPRAZolam (XANAX) 0.25 MG tablet Take 1 tablet (0.25 mg total) by mouth 2 (two) times daily as needed for anxiety. 20 tablet 0   amLODipine (NORVASC) 5 MG tablet Take 1 tablet (5 mg total) by mouth daily. 90 tablet 3   clopidogrel (PLAVIX) 75 MG tablet TAKE 1 TABLET BY MOUTH EVERY DAY 90 tablet 3   finasteride (PROSCAR) 5 MG tablet TAKE 1 TABLET BY MOUTH EVERY DAY 90 tablet 3   hydrocortisone (ANUSOL-HC) 2.5 % rectal cream Place 1 Application rectally at bedtime. 30 g 1   Magnesium Oxide 400 MG CAPS Take 1 capsule (400 mg total) by mouth daily. 30 capsule 5   metoprolol succinate (TOPROL-XL) 25 MG 24 hr tablet Take 2 tablets (50 mg total) by mouth in the morning and at bedtime. 120 tablet 5   pantoprazole (PROTONIX) 40 MG tablet TAKE 1 TABLET BY MOUTH EVERY DAY 90 tablet 3   tamsulosin (FLOMAX) 0.4 MG CAPS capsule TAKE 2 CAPSULES (0.8 MG TOTAL) BY MOUTH DAILY AFTER SUPPER. 180 capsule 3   No current facility-administered medications on file prior to visit.    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/2019   Procedure: PACEMAKER  IMPLANT;  Surgeon: Lanier Prude, MD;  Location: [facility];  Service: Cardiovascular;  Laterality: N/A;   PROSTATE BIOPSY  2011   negative   RETINAL DETACHMENT SURGERY  1985   left, right 05/07   ROTATOR CUFF REPAIR  10/09   right   TONSILLECTOMY  1972    Family History  Problem Relation Age of Onset   Cancer Father        prostate   Cancer Paternal Uncle        prostate cancer   Colon cancer Neg Hx    Rectal cancer Neg Hx    Stomach cancer Neg Hx     Social History   Socioeconomic History   Marital status: Married    Spouse name: Not on file   Number of children: 3   Years of education: Not on file   Highest education level: Not on file  Occupational History   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    Employer: [employer]   Occupation: Quality assurance--cleaning    Employer: [employer]    Comment:     Occupation: Custodial/funeral assistance    Comment: [employer]  Tobacco Use   Smoking status: Never  Passive exposure: Past   Smokeless tobacco: Never  Vaping Use   Vaping status: Never Used  Substance and Sexual Activity   Alcohol use: No   Drug use: No   Sexual activity: Not on file  Other Topics Concern   Not on file  Social History Narrative   Has living will   Wife has [place] health care POA and alternate is Jeanie Sewer (cousin)   Would accept attempts at resuscitation but no prolonged artificial ventilation   Not prolonged feeding tube   Social Determinants of Health   Financial Resource Strain: Not on file  Food Insecurity: Not on file  Transportation Needs: Not on file  Physical Activity: Not on file  Stress: Not on file  Social Connections: Not on file  Intimate Partner Violence: Not on file    Review of Systems No recent travel No tinnitus Hearing is the same    Objective:   Physical Exam HENT:     Right Ear: Tympanic membrane and ear canal normal.     Left Ear: Tympanic membrane and ear canal  normal.     Ears:     Comments: No tragal tenderness    Mouth/Throat:     Comments: No oral lesions and teeth okay TMJ--no pain Neck:     Comments: Slight sensitivity when palpating submandibular on right (seemed to hear in ear) Lymphadenopathy:     Cervical: No cervical adenopathy.            Assessment & Plan:

## 2022-12-03 ENCOUNTER — Ambulatory Visit: Payer: Medicare HMO | Admitting: Internal Medicine

## 2022-12-03 ENCOUNTER — Telehealth: Payer: Self-pay | Admitting: Internal Medicine

## 2022-12-03 ENCOUNTER — Encounter: Payer: Self-pay | Admitting: Internal Medicine

## 2022-12-03 VITALS — BP 120/80 | HR 75 | Ht 63.0 in | Wt 184.0 lb

## 2022-12-03 DIAGNOSIS — R159 Full incontinence of feces: Secondary | ICD-10-CM | POA: Diagnosis not present

## 2022-12-03 DIAGNOSIS — K649 Unspecified hemorrhoids: Secondary | ICD-10-CM

## 2022-12-03 DIAGNOSIS — K648 Other hemorrhoids: Secondary | ICD-10-CM

## 2022-12-03 DIAGNOSIS — R151 Fecal smearing: Secondary | ICD-10-CM

## 2022-12-03 DIAGNOSIS — K625 Hemorrhage of anus and rectum: Secondary | ICD-10-CM

## 2022-12-03 DIAGNOSIS — Z860100 Personal history of colon polyps, unspecified: Secondary | ICD-10-CM

## 2022-12-03 NOTE — Progress Notes (Signed)
HISTORY OF PRESENT ILLNESS:  Bobby Woods is a 77 y.o. male with multiple medical problems was evaluated in the office August 29, 2022 and October 07, 2022 regarding minor rectal bleeding and rectal seepage secondary to hemorrhoids identified on physical exam.  He was treated with fiber and medicated suppositories.  He follows up at this time.  Patient is pleased to report that he has had significant improvement in his bowel habits.  Now going once or twice a day with more formed movements.  Less seepage.  Trivial bleeding at times.  Using fiber daily.  Not using suppositories on demand.  No other complaints.  Does have a history of nonadvanced adenomatous colon polyps and has undergone multiple colonoscopies in the past including 2002, 2006, and 2016.  REVIEW OF SYSTEMS:  All non-GI ROS negative unless otherwise stated in the HPI except for arthritis  Past Medical History:  Diagnosis Date   Arthritis    Basal cell carcinoma (BCC)    Blood transfusion without reported diagnosis    age 73 year    BPH (benign prostatic hypertrophy)    Chronic kidney disease    GERD (gastroesophageal reflux disease)    Hypertension    on medicines     Past Surgical History:  Procedure Laterality Date   COLONOSCOPY  2006   HAND SURGERY     INGUINAL HERNIA REPAIR  1965   PACEMAKER IMPLANT N/A 09/16/2019   Procedure: PACEMAKER IMPLANT;  Surgeon: Lanier Prude, MD;  Location: [facility];  Service: Cardiovascular;  Laterality: N/A;   PROSTATE BIOPSY  2011   negative   RETINAL DETACHMENT SURGERY  1985   left, right 05/07   ROTATOR CUFF REPAIR  10/09   right   TONSILLECTOMY  1972    Social History Bobby Woods  reports that he has never smoked. He has been exposed to tobacco smoke. He has never used smokeless tobacco. He reports that he does not drink alcohol and does not use drugs.  family history includes Cancer in his father and paternal uncle.  Allergies  Allergen Reactions    Amoxicillin Diarrhea   Azithromycin Dihydrate Rash    GI upset       PHYSICAL EXAMINATION: Vital signs: BP 120/80 (BP Location: Left Arm, Patient Position: Sitting, Cuff Size: Normal)   Pulse 75   Ht 5\' 3"  (1.6 m)   Wt 184 lb (83.5 kg)   SpO2 95%   BMI 32.59 kg/m  General: Well-developed, well-nourished, no acute distress HEENT: Sclerae are anicteric, conjunctiva pink. Oral mucosa intact Lungs: Clear Heart: Regular Abdomen: soft, nontender, nondistended, no obvious ascites, no peritoneal signs, normal bowel sounds. No organomegaly. Extremities: No edema Psychiatric: alert and oriented x3. Cooperative     ASSESSMENT:  1.  Internal hemorrhoids plan of rectal bleeding-rectal seepage.  Improved on fiber and Anusol suppositories.  Can hold off on office hemorrhoidal banding procedure.  He agrees 2.  History of diminutive colon polyp.  Aged out of surveillance 3.  General Medical problems   PLAN:  1.  Continue daily fiber indefinitely (Citrucel 2 tablespoons. 2.  Continue with Anusol suppositories on demand 3.  Resume general medical care with Dr. Alphonsus Sias 4.  GI follow-up as needed

## 2022-12-03 NOTE — Telephone Encounter (Signed)
Inbound call from patient stating that he was just seen this morning for an appointment with Dr. Marina Goodell and forgot to ask why he hiccups all the time. Please advise.

## 2022-12-03 NOTE — Patient Instructions (Signed)
Please follow up as needed.  _______________________________________________________  If your blood pressure at your visit was 140/90 or greater, please contact your primary care physician to follow up on this.  _______________________________________________________  If you are age 77 or older, your body mass index should be between 23-30. Your Body mass index is 32.59 kg/m. If this is out of the aforementioned range listed, please consider follow up with your Primary Care Provider.  If you are age 104 or younger, your body mass index should be between 19-25. Your Body mass index is 32.59 kg/m. If this is out of the aformentioned range listed, please consider follow up with your Primary Care Provider.   ________________________________________________________  The  GI providers would like to encourage you to use [facility] to communicate with providers for non-urgent requests or questions.  Due to long hold times on the telephone, sending your provider a message by [facility] may be a faster and more efficient way to get a response.  Please allow 48 business hours for a response.  Please remember that this is for non-urgent requests.  _______________________________________________________

## 2022-12-03 NOTE — Telephone Encounter (Signed)
Spoke with pt and told him I posed his question to Dr. Marina Goodell who said he wasn't really sure what was the cause of his hiccups.  Dr. Marina Goodell doesn't think it is a GI problem and told him to check with his pcp.  Patient agreed.

## 2022-12-05 DIAGNOSIS — H6981 Other specified disorders of Eustachian tube, right ear: Secondary | ICD-10-CM | POA: Diagnosis not present

## 2022-12-05 DIAGNOSIS — H903 Sensorineural hearing loss, bilateral: Secondary | ICD-10-CM | POA: Diagnosis not present

## 2022-12-06 ENCOUNTER — Other Ambulatory Visit: Payer: Self-pay | Admitting: Internal Medicine

## 2022-12-12 ENCOUNTER — Ambulatory Visit (INDEPENDENT_AMBULATORY_CARE_PROVIDER_SITE_OTHER): Payer: Medicare HMO

## 2022-12-12 DIAGNOSIS — I495 Sick sinus syndrome: Secondary | ICD-10-CM

## 2022-12-13 LAB — CUP PACEART REMOTE DEVICE CHECK
Battery Remaining Longevity: 119 mo
Battery Voltage: 3.01 V
Brady Statistic AP VP Percent: 0.1 %
Brady Statistic AP VS Percent: 98.1 %
Brady Statistic AS VP Percent: 0 %
Brady Statistic AS VS Percent: 1.8 %
Brady Statistic RA Percent Paced: 98.24 %
Brady Statistic RV Percent Paced: 0.1 %
Date Time Interrogation Session: 20241129041135
Implantable Lead Connection Status: 753985
Implantable Lead Connection Status: 753985
Implantable Lead Implant Date: 20210903
Implantable Lead Implant Date: 20210903
Implantable Lead Location: 753859
Implantable Lead Location: 753860
Implantable Lead Model: 5076
Implantable Lead Model: 5076
Implantable Pulse Generator Implant Date: 20210903
Lead Channel Impedance Value: 266 Ohm
Lead Channel Impedance Value: 323 Ohm
Lead Channel Impedance Value: 323 Ohm
Lead Channel Impedance Value: 399 Ohm
Lead Channel Pacing Threshold Amplitude: 0.5 V
Lead Channel Pacing Threshold Amplitude: 0.75 V
Lead Channel Pacing Threshold Pulse Width: 0.4 ms
Lead Channel Pacing Threshold Pulse Width: 0.4 ms
Lead Channel Sensing Intrinsic Amplitude: 1.875 mV
Lead Channel Sensing Intrinsic Amplitude: 1.875 mV
Lead Channel Sensing Intrinsic Amplitude: 6.125 mV
Lead Channel Sensing Intrinsic Amplitude: 6.125 mV
Lead Channel Setting Pacing Amplitude: 1.5 V
Lead Channel Setting Pacing Amplitude: 2 V
Lead Channel Setting Pacing Pulse Width: 0.4 ms
Lead Channel Setting Sensing Sensitivity: 1.2 mV
Zone Setting Status: 755011
Zone Setting Status: 755011

## 2022-12-20 ENCOUNTER — Other Ambulatory Visit: Payer: Self-pay | Admitting: Internal Medicine

## 2023-01-15 DIAGNOSIS — H6983 Other specified disorders of Eustachian tube, bilateral: Secondary | ICD-10-CM | POA: Diagnosis not present

## 2023-01-15 DIAGNOSIS — H903 Sensorineural hearing loss, bilateral: Secondary | ICD-10-CM | POA: Diagnosis not present

## 2023-01-19 ENCOUNTER — Ambulatory Visit: Payer: Self-pay | Admitting: Internal Medicine

## 2023-01-19 NOTE — Telephone Encounter (Signed)
 Copied from CRM [id number]. Topic: Clinical - Pink Word Triage >> Jan 19, 2023  3:51 PM Robinson H wrote: Reason for Triage: Patient is having some frequent urination that's getting worse during the day.  [place] [phone]   Chief Complaint: urinary symptoms Symptoms: urinary frequency Frequency: worsening over the past month Pertinent Negatives: Patient denies fever, or pain Disposition: [] ED /[] Urgent Care (no appt availability in office) / [x] Appointment(In office/virtual)/ []  [facility]/ [] Home Care/ [] Refused Recommended Disposition /[] [facility] [place] [facility]/ []  Follow-up with PCP Additional Notes: Patient reports urinary frequency. States that he already urinates a lot especially at night due to medications, but states that its been more frequent during  the day causing interference at work.   Reason for Disposition  Urinating more frequently than usual (i.e., frequency)  Answer Assessment - Initial Assessment Questions 1. SYMPTOM: What's the main symptom you're concerned about? (e.g., frequency, incontinence)     Urinary frequency  2. ONSET: When did the  symptoms  start?     Been going on for month at night, but has gotten worse over the past month  3. PAIN: Is there any pain? If Yes, ask: How bad is it? (Scale: 1-10; mild, moderate, severe)     No  4. CAUSE: What do you think is causing the symptoms?     Concerned about UTI  5. OTHER SYMPTOMS: Do you have any other symptoms? (e.g., blood in urine, fever, flank pain, pain with urination)     No other symptoms  Protocols used: Urinary Symptoms-A-AH

## 2023-01-20 ENCOUNTER — Ambulatory Visit: Payer: Self-pay | Admitting: Primary Care

## 2023-01-20 ENCOUNTER — Encounter: Payer: Self-pay | Admitting: Primary Care

## 2023-01-20 VITALS — BP 112/78 | HR 85 | Temp 97.8°F | Ht 63.0 in | Wt 188.0 lb

## 2023-01-20 DIAGNOSIS — N401 Enlarged prostate with lower urinary tract symptoms: Secondary | ICD-10-CM | POA: Diagnosis not present

## 2023-01-20 DIAGNOSIS — N138 Other obstructive and reflux uropathy: Secondary | ICD-10-CM | POA: Diagnosis not present

## 2023-01-20 DIAGNOSIS — Z8042 Family history of malignant neoplasm of prostate: Secondary | ICD-10-CM

## 2023-01-20 DIAGNOSIS — R350 Frequency of micturition: Secondary | ICD-10-CM | POA: Diagnosis not present

## 2023-01-20 LAB — POC URINALSYSI DIPSTICK (AUTOMATED)
Bilirubin, UA: NEGATIVE
Blood, UA: NEGATIVE
Glucose, UA: NEGATIVE
Ketones, UA: NEGATIVE
Leukocytes, UA: NEGATIVE
Nitrite, UA: NEGATIVE
Protein, UA: NEGATIVE
Spec Grav, UA: 1.02 (ref 1.010–1.025)
Urobilinogen, UA: 0.2 U/dL
pH, UA: 5.5 (ref 5.0–8.0)

## 2023-01-20 NOTE — Patient Instructions (Signed)
 Stop by the lab prior to leaving today. I will notify you of your results once received.   It was a pleasure meeting you!

## 2023-01-20 NOTE — Progress Notes (Signed)
 Subjective:    Patient ID: Bobby Woods, male    DOB: 1945-04-15, 78 y.o.   MRN: [id number]  Urinary Frequency  Associated symptoms include frequency and urgency. Pertinent negatives include no chills or hematuria.    Bobby Woods is a very pleasant 78 y.o. male patient of Dr. Jimmy with a history of foraminal stenosis of lumbar region, BPH with obstruction/LUTS, chronic renal disease, CVA who presents today to discuss urinary frequency and lower back pain.  Symptom onset years ago with intermittent symptoms of increased urinary frequency, urinary urgency, nocturia (4 times per night), urinary incontinence.   Over the last month he's noticed increased urinary frequency during the day and evening. He is compliant to 0.8 mg of tamsulosin  and 5 mg finasteride  daily.  He primarily drinks sweet tea, decaffeinated, during the day. He does also drink lemonade, water, and coffee.   He denies weak urinary stream, hematuria, personal history of prostate cancer, dysuria, sudden loss of bowel/bladder control, groin numbness. He has a family history of prostate cancer in his father, did undergo a prostate biopsy around 2011 which was negative for cancer.   He has chronic lower back pain, no worse than usual. He underwent MRI lumbar spine in 2021 which revealed multilevel degenerative changes of the lumbar spine with mild spinal canal's stenosis at L3-4.  Also with multilevel neural foraminal narrowing, moderate on the left side at L2-3, L3-4, L4-5.  History of slightly elevated PSA, determine age appropriate.   PSA in 2020: 4.08 PSA in 2019: 4.6 PSA in 2018: 4.08 PSA in 2016: 3.15 PSA in 2011: 5.52    Review of Systems  Constitutional:  Negative for chills and fever.  Gastrointestinal:  Negative for abdominal pain.  Genitourinary:  Positive for frequency and urgency. Negative for difficulty urinating, dysuria, hematuria, penile pain, penile swelling, scrotal swelling and testicular pain.   Musculoskeletal:  Positive for back pain.         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    Employer: [employer]   Occupation: Quality assurance--cleaning    Employer: [employer]    Comment:     Occupation: Custodial/funeral assistance    Comment: [employer]  Tobacco Use   Smoking status: Never    Passive exposure: Past   Smokeless tobacco: Never  Vaping Use   Vaping status: Never Used  Substance and Sexual Activity   Alcohol use: No   Drug use: No   Sexual activity: Not on file  Other Topics Concern   Not on file  Social History Narrative   Has living will   Wife has [place] health care POA and alternate is Montel Dales (cousin)   Would accept attempts at resuscitation but no prolonged artificial ventilation   Not prolonged feeding tube   Social Drivers of Health   Financial Resource Strain:  Not on file  Food Insecurity: Not on file  Transportation Needs: Not on file  Physical Activity: Not on file  Stress: Not on file  Social Connections: Not on file  Intimate Partner Violence: Not on file    Past Surgical History:  Procedure Laterality Date   COLONOSCOPY  2006   HAND SURGERY     INGUINAL HERNIA REPAIR  1965   PACEMAKER IMPLANT N/A 09/16/2019   Procedure: PACEMAKER IMPLANT;  Surgeon: Cindie Ole DASEN, MD;  Location: [facility];  Service: Cardiovascular;  Laterality: N/A;   PROSTATE BIOPSY  2011   negative   RETINAL DETACHMENT  SURGERY  1985   left, right 05/07   ROTATOR CUFF REPAIR  10/09   right   TONSILLECTOMY  1972    Family History  Problem Relation Age of Onset   Cancer Father        prostate   Cancer Paternal Uncle        prostate cancer   Colon cancer Neg Hx    Rectal cancer Neg Hx    Stomach cancer Neg Hx     Allergies  Allergen Reactions   Amoxicillin  Diarrhea   Azithromycin  Dihydrate Rash    GI upset    Current Outpatient Medications on File Prior to Visit  Medication Sig Dispense Refill   ALPRAZolam  (XANAX ) 0.25 MG tablet Take 1 tablet (0.25 mg total) by mouth 2 (two) times daily as needed for anxiety. 20 tablet 0   amLODipine  (NORVASC ) 5 MG tablet Take 1 tablet (5 mg total) by mouth daily. 90 tablet 3   clopidogrel  (PLAVIX ) 75 MG tablet TAKE 1 TABLET BY MOUTH EVERY DAY 90 tablet 3   finasteride  (PROSCAR ) 5 MG tablet TAKE 1 TABLET BY MOUTH EVERY DAY 90 tablet 3   hydrocortisone  (ANUSOL -HC) 2.5 % rectal cream Place 1 Application rectally at bedtime. 30 g 1   Magnesium  Oxide 400 MG CAPS Take 1 capsule (400 mg total) by mouth daily. 30 capsule 5   metoprolol  succinate (TOPROL -XL) 25 MG 24 hr tablet Take 2 tablets (50 mg total) by mouth in the morning and at bedtime. 120 tablet 5   pantoprazole  (PROTONIX ) 40 MG tablet TAKE 1 TABLET BY MOUTH EVERY DAY 90 tablet 3   tamsulosin  (FLOMAX ) 0.4 MG CAPS capsule TAKE 2 CAPSULES (0.8 MG TOTAL) BY MOUTH DAILY AFTER SUPPER. 180 capsule 3   No current facility-administered medications on file prior to visit.    BP 112/78   Pulse 85   Temp 97.8 F (36.6 C) (Temporal)   Ht 5' 3 (1.6 m)   Wt 188 lb (85.3 kg)   SpO2 97%   BMI 33.30 kg/m  Objective:   Physical Exam Cardiovascular:     Rate and Rhythm: Normal rate and regular rhythm.  Pulmonary:     Effort: Pulmonary effort is normal.     Breath sounds: Normal breath sounds.  Abdominal:     Tenderness: There is no right CVA tenderness or left CVA tenderness.  Musculoskeletal:     Cervical  back: Neck supple.  Skin:    General: Skin is warm and dry.  Neurological:     Mental Status: He is alert and oriented to person, place, and time.  Psychiatric:        Mood and Affect: Mood normal.           Assessment & Plan:  Urinary frequency Assessment & Plan: Differentials include BPH, prostatitis, cystitis, overactive bladder.  Based on presentation  and HPI today symptoms seem more likely BPH versus overactive bladder. Already on max doses of finasteride  and tamsulosin .  PSA levels reviewed in chart, ordered and pending. CBC with differential and A1c ordered and pending. Urinalysis today negative.  Doubt cystitis and there are no signs of pyelonephritis.  Lower suspicion for prostatitis.  We did discuss today common causes for urinary frequency such as sugary drinks, caffeinated drinks, spicy food, etc.  The recommendation was to limit sweet tea and lemonade, switch to water.   Question if beta 3 agonist treatment such as Myrbetriq  would be beneficial.  Also consider insurance coverage. Tadalafil likely not an option given risks of vasodilation in combination with 5 alpha reductase inhibitor.  Will consult with PCP.  Orders: -     POCT Urinalysis Dipstick (Automated) -     CBC with Differential/Platelet -     PSA -     Hemoglobin A1c  BPH with obstruction/lower urinary tract symptoms -     PSA  Family history of prostate cancer in father -     PSA        Comer MARLA Gaskins, NP

## 2023-01-20 NOTE — Assessment & Plan Note (Addendum)
 Differentials include BPH, prostatitis, cystitis, overactive bladder.  Based on presentation and HPI today symptoms seem more likely BPH versus overactive bladder. Already on max doses of finasteride  and tamsulosin .  PSA levels reviewed in chart, ordered and pending. CBC with differential and A1c ordered and pending. Urinalysis today negative.  Doubt cystitis and there are no signs of pyelonephritis.  Lower suspicion for prostatitis.  We did discuss today common causes for urinary frequency such as sugary drinks, caffeinated drinks, spicy food, etc.  The recommendation was to limit sweet tea and lemonade, switch to water.   Question if beta 3 agonist treatment such as Myrbetriq  would be beneficial.  Also consider insurance coverage. Tadalafil likely not an option given risks of vasodilation in combination with 5 alpha reductase inhibitor.  Will consult with PCP.

## 2023-01-21 LAB — CBC WITH DIFFERENTIAL/PLATELET
Basophils Absolute: 0 10*3/uL (ref 0.0–0.1)
Basophils Relative: 0.6 % (ref 0.0–3.0)
Eosinophils Absolute: 0.3 10*3/uL (ref 0.0–0.7)
Eosinophils Relative: 3.9 % (ref 0.0–5.0)
HCT: 46 % (ref 39.0–52.0)
Hemoglobin: 15.6 g/dL (ref 13.0–17.0)
Lymphocytes Relative: 24.6 % (ref 12.0–46.0)
Lymphs Abs: 1.7 10*3/uL (ref 0.7–4.0)
MCHC: 33.8 g/dL (ref 30.0–36.0)
MCV: 93.7 fL (ref 78.0–100.0)
Monocytes Absolute: 0.8 10*3/uL (ref 0.1–1.0)
Monocytes Relative: 12 % (ref 3.0–12.0)
Neutro Abs: 4 10*3/uL (ref 1.4–7.7)
Neutrophils Relative %: 58.9 % (ref 43.0–77.0)
Platelets: 223 10*3/uL (ref 150.0–400.0)
RBC: 4.91 Mil/uL (ref 4.22–5.81)
RDW: 13.4 % (ref 11.5–15.5)
WBC: 6.7 10*3/uL (ref 4.0–10.5)

## 2023-01-21 LAB — PSA: PSA: 11.97 ng/mL — ABNORMAL HIGH (ref 0.10–4.00)

## 2023-01-21 LAB — HEMOGLOBIN A1C: Hgb A1c MFr Bld: 6.2 % (ref 4.6–6.5)

## 2023-01-22 ENCOUNTER — Other Ambulatory Visit: Payer: Self-pay | Admitting: *Deleted

## 2023-01-22 ENCOUNTER — Telehealth: Payer: Self-pay | Admitting: Internal Medicine

## 2023-01-22 DIAGNOSIS — R972 Elevated prostate specific antigen [PSA]: Secondary | ICD-10-CM

## 2023-01-22 DIAGNOSIS — R350 Frequency of micturition: Secondary | ICD-10-CM

## 2023-01-22 DIAGNOSIS — N138 Other obstructive and reflux uropathy: Secondary | ICD-10-CM

## 2023-01-22 MED ORDER — AMLODIPINE BESYLATE 5 MG PO TABS: 5.0000 mg | ORAL_TABLET | Freq: Every day | ORAL | 0 refills | Status: DC

## 2023-01-22 MED ORDER — SULFAMETHOXAZOLE-TRIMETHOPRIM 800-160 MG PO TABS: 1.0000 | ORAL_TABLET | Freq: Two times a day (BID) | ORAL | 0 refills | Status: AC

## 2023-01-22 NOTE — Progress Notes (Signed)
 Cardiology Office Note:    Date:  01/23/2023   ID:  Bobby Woods, DOB October 03, 1945, MRN [id number]  PCP:  Jimmy Charlie FERNS, MD  [facility] Cardiologist:  Redell Cave, MD  [facility] Electrophysiologist:  OLE ONEIDA HOLTS, MD   Referring MD: Jimmy Charlie FERNS, MD   Chief Complaint: follow-up  History of Present Illness:    Bobby Woods is a 78 y.o. male with a hx of GERD, SSS s/p PPM, HTN who presents for follow-up.   Echo from 2021 showed normal LVSF and diastolic function, EF 60-65%. 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 Coronary CT 08/2019 showed calcium score of 0, no CAD.  Myoview  Lexiscan  2022 was low risk, abnormal due to small in size, mild in severity reversible defect involving the apical lateral and apical inferior segments that may reflect subtle ischemia but cannot rule out prior infarct.  LVEF was normal.  No significant coronary artery calcifications were observed.  Echo in June 2024 showed EF of 60 to 65%, no wall motion abnormality, mild LVH, grade 1 diastolic dysfunction, mild MR.  Heart monitor in June 2024 showed paroxysmal SVT longest lasting 19 beats, fastest lasting 10 seconds, PVCs less than 1% burden, PACs less than 1% burden.  The patient was last seen 07/2022 by EP for atrial tach episodes.  Toprol  was increased to 50mg  daily and magnesium  was started.   Today, the patient reports he has been doing well. He denies palpitations, heart racing. He reports occasional chest pain during the night. He had to take a lot of trash out yesterday and used his arms more than normal. Pacemaker site is sore at times. He denies shortness of breath. No lower leg edema, orthopnea or pnd. He denies dizziness or lightheadedness.   Past Medical History:  Diagnosis Date   Arthritis    Basal cell carcinoma (BCC)    Blood transfusion without reported diagnosis    age 57 year    BPH (benign prostatic hypertrophy)    Chronic kidney disease    GERD  (gastroesophageal reflux disease)    Hypertension    on medicines     Past Surgical History:  Procedure Laterality Date   COLONOSCOPY  2006   HAND SURGERY     INGUINAL HERNIA REPAIR  1965   PACEMAKER IMPLANT N/A 09/16/2019   Procedure: PACEMAKER IMPLANT;  Surgeon: Holts Ole ONEIDA, MD;  [facility];  Service: Cardiovascular;  Laterality: N/A;   PROSTATE BIOPSY  2011   negative   RETINAL DETACHMENT SURGERY  1985   left, right 05/07   ROTATOR CUFF REPAIR  10/09   right   TONSILLECTOMY  1972    Current Medications: Current Meds  Medication Sig   ALPRAZolam  (XANAX ) 0.25 MG tablet Take 1 tablet (0.25 mg total) by mouth 2 (two) times daily as needed for anxiety.   amLODipine  (NORVASC ) 5 MG tablet Take 1 tablet (5 mg total) by mouth daily.   clopidogrel  (PLAVIX ) 75 MG tablet TAKE 1 TABLET BY MOUTH EVERY DAY   finasteride  (PROSCAR ) 5 MG tablet TAKE 1 TABLET BY MOUTH EVERY DAY   hydrocortisone  (ANUSOL -HC) 2.5 % rectal cream Place 1 Application rectally at bedtime.   Magnesium  Oxide 400 MG CAPS Take 1 capsule (400 mg total) by mouth daily.   metoprolol  succinate (TOPROL -XL) 25 MG 24 hr tablet Take 2 tablets (50 mg total) by mouth in the morning and at bedtime.   pantoprazole  (PROTONIX ) 40 MG tablet TAKE 1  TABLET BY MOUTH EVERY DAY   sulfamethoxazole -trimethoprim  (BACTRIM  DS) 800-160 MG tablet Take 1 tablet by mouth 2 (two) times daily for 14 days.   tamsulosin  (FLOMAX ) 0.4 MG CAPS capsule TAKE 2 CAPSULES (0.8 MG TOTAL) BY MOUTH DAILY AFTER SUPPER.     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    Employer: [employer]   Occupation: Quality assurance--cleaning    Employer: [employer]     Comment:     Occupation: Custodial/funeral assistance    Comment: Building Control Surveyor  Tobacco Use   Smoking status: Never    Passive exposure: Past   Smokeless tobacco: Never  Vaping Use   Vaping status: Never Used  Substance and Sexual Activity   Alcohol use: No   Drug use: No   Sexual activity: Not on file  Other Topics Concern   Not on file  Social History Narrative   Has living will   Wife has [place] health care POA and alternate is Proofreader (cousin)   Would accept attempts at resuscitation but no prolonged artificial ventilation   Not prolonged feeding tube   Social Drivers of Health   Financial Resource Strain: Not on file  Food Insecurity: Not on file  Transportation Needs: Not on file  Physical Activity: Not on file  Stress: Not on file  Social Connections: Not on file     Family History: The patient's family history includes Cancer in his father and paternal uncle. There is no history of Colon cancer, Rectal cancer, or Stomach cancer.  ROS:   Please see the history of present illness.     All other systems reviewed and are negative.  EKGs/Labs/Other Studies Reviewed:    The following studies were reviewed today:  Echo 06/2022  1. Left ventricular ejection fraction, by estimation, is 60 to 65%. The  left ventricle has normal function. The left ventricle has no regional  wall motion abnormalities. There is mild left ventricular hypertrophy.  Left ventricular diastolic parameters  are consistent with Grade I diastolic dysfunction (impaired relaxation).  The average left ventricular global longitudinal strain is -17.6 %. The  global longitudinal strain is normal.   2. Right ventricular systolic function is normal. The right ventricular  size is normal.   3. The mitral valve is normal in structure. Mild mitral valve  regurgitation.   4. The aortic valve is tricuspid. Aortic valve regurgitation is not  visualized.   5. The inferior vena cava is normal in size  with greater than 50%  respiratory variability, suggesting right atrial pressure of 3 mmHg.   Myoview  lexiscan  10/2020    Abnormal, probably low risk pharmacologic myocardial perfusion stress test.   There is a small in size, mild in severity, reversible defect involving the apical lateral and apical inferior segments that may reflect subtle ischemia but cannot rule out artifact.   There is no evidence of prior infarct.   Left ventricular function is hyperdynamic (LVEF > 65%).   No significant coronary artery calcification is observed.   Incidental note is made of mild aortic atherosclerosis, cardiac pacemaker, multiple calcified gallstones, and indeterminate hepatic hypodensity (question cyst as noted on prior abdominal  CT in 2020).    Cardiac CTA 2021  IMPRESSION: 1. Coronary calcium score of 0. Patient is low risk for near term coronary events   2. Normal coronary origin with right dominance.   3. No evidence of CAD.   4. CAD-RADS 0. Consider non-atherosclerotic causes of chest pain.   Electronically Signed: By: Redell Cave M.D. On: 08/25/2019 16:36  Echo 07/2019 1. Left ventricular ejection fraction, by estimation, is 60 to 65%. The  left ventricle has normal function. The left ventricle has no regional  wall motion abnormalities. Left ventricular diastolic parameters were  normal.   2. Right ventricular systolic function is normal. The right ventricular  size is mildly enlarged. There is normal pulmonary artery systolic  pressure.   3. The mitral valve is normal in structure. No evidence of mitral valve  regurgitation.   4. The aortic valve is tricuspid. Aortic valve regurgitation is not  visualized.   5. The inferior vena cava is normal in size with greater than 50%  respiratory variability, suggesting right atrial pressure of 3 mmHg.    Recent Labs: 06/02/2022: BUN 13; Creatinine, Ser 1.20; Potassium 4.0; Sodium 138 06/23/2022: Magnesium  2.2 01/20/2023:  Hemoglobin 15.6; Platelets 223.0  Recent Lipid Panel    Component Value Date/Time   CHOL 110 07/27/2020 0925   TRIG 77.0 07/27/2020 0925   HDL 42.30 07/27/2020 0925   CHOLHDL 3 07/27/2020 0925   VLDL 15.4 07/27/2020 0925   LDLCALC 52 07/27/2020 0925     Physical Exam:    VS:  BP 121/74 (BP Location: Left Arm, Patient Position: Sitting, Cuff Size: Normal)   Pulse 84   Ht 5' 3.5 (1.613 m)   Wt 186 lb 3.2 oz (84.5 kg)   SpO2 99%   BMI 32.47 kg/m     Wt Readings from Last 3 Encounters:  01/23/23 186 lb 3.2 oz (84.5 kg)  01/20/23 188 lb (85.3 kg)  12/03/22 184 lb (83.5 kg)     GEN:  Well nourished, well developed in no acute distress HEENT: Normal NECK: No JVD; No carotid bruits LYMPHATICS: No lymphadenopathy CARDIAC: RRR, no murmurs, rubs, gallops RESPIRATORY:  Clear to auscultation without rales, wheezing or rhonchi  ABDOMEN: Soft, non-tender, non-distended MUSCULOSKELETAL:  No edema; No deformity  SKIN: Warm and dry NEUROLOGIC:  Alert and oriented x 3 PSYCHIATRIC:  Normal affect   ASSESSMENT:    1. Chest pain, unspecified type   2. Tachycardia-bradycardia syndrome (HCC)   3. Palpitations   4. Pacemaker   5. Essential hypertension    PLAN:    In order of problems listed above:  Chest pain Patient reports he was lifting more than usual and later that night he noted centralized and left-sided chest pain.  It did not radiate, he had no associated symptoms.  Chest pain lasted no more than 5 minutes.  It was not worse with inspiration or exertion.  Patient is sore to palpation on exam today.  EKG today shows A paced rhythm that is nonischemic.  Suspect musculoskeletal chest pain.  Patient had cardiac CTA in 2021 that showed no CAD.  He had a Myoview  Lexi scan in 2022 was abnormal, but probably low risk.  It showed small in size, mild in severity reversible defect involving the apical lateral and apical inferior segments that may reflect subtle ischemia, but cannot rule  out prior infarct.  EF was normal.  No significant coronary artery calcification was noted.  Echo in June showed normal pump function with  no wall motion abnormality.  At this point we will continue to monitor symptoms and medical management. We will re-evaluate symptoms at follow-up.  Tachybradycardia syndrome s/p PPM Palpitations PACs/PVCs Patient denies significant palpitations.  Continue Toprol  50 mg twice a day.  Patient follows with EP. Pacemaker site looks good.   Hypertension Blood pressure normal today.  Continue amlodipine  and Toprol .  Disposition: Follow up in 5 month(s) with MD/APP    Signed, [place] Straus VEAR Fishman, PA-C  01/23/2023 11:33 AM    [facility]

## 2023-01-22 NOTE — Addendum Note (Signed)
 Addended by: Doreene Nest on: 01/22/2023 04:05 PM   Modules accepted: Orders

## 2023-01-22 NOTE — Telephone Encounter (Signed)
 Noted.  Prescription sent to pharmacy and referral placed.

## 2023-01-22 NOTE — Telephone Encounter (Signed)
 Spoke to pt, told pt Clark's response. Pt had no questions/concerns. Pt states he does want to start the antibiotic & wants the referral to a urologist. Pt preferred location is [place]. Pt mentioned he use to see a urologist in the bottom floor of [facility]. Call back # [phone].

## 2023-01-23 ENCOUNTER — Encounter: Payer: Self-pay | Admitting: Medical

## 2023-01-23 ENCOUNTER — Ambulatory Visit: Payer: Medicare HMO | Attending: Medical | Admitting: Medical

## 2023-01-23 VITALS — BP 121/74 | HR 84 | Ht 63.5 in | Wt 186.2 lb

## 2023-01-23 DIAGNOSIS — R079 Chest pain, unspecified: Secondary | ICD-10-CM

## 2023-01-23 DIAGNOSIS — Z950 Presence of cardiac pacemaker: Secondary | ICD-10-CM

## 2023-01-23 DIAGNOSIS — I495 Sick sinus syndrome: Secondary | ICD-10-CM | POA: Diagnosis not present

## 2023-01-23 DIAGNOSIS — R002 Palpitations: Secondary | ICD-10-CM | POA: Diagnosis not present

## 2023-01-23 DIAGNOSIS — I10 Essential (primary) hypertension: Secondary | ICD-10-CM | POA: Diagnosis not present

## 2023-01-23 NOTE — Patient Instructions (Signed)
 Medication Instructions:  No changes at this time.   *If you need a refill on your cardiac medications before your next appointment, please call your pharmacy*   Lab Work: None  If you have labs (blood work) drawn today and your tests are completely normal, you will receive your results only by: MyChart Message (if you have MyChart) OR A paper copy in the mail If you have any lab test that is abnormal or we need to change your treatment, we will call you to review the results.   Testing/Procedures: None   Follow-Up: At [facility], you and your health needs are our priority.  As part of our continuing mission to provide you with exceptional heart care, we have created designated Provider Care Teams.  These Care Teams include your primary Cardiologist (physician) and Advanced Practice Providers (APPs -  Physician Assistants and Nurse Practitioners) who all work together to provide you with the care you need, when you need it.  We recommend signing up for the patient portal called MyChart.  Sign up information is provided on this After Visit Summary.  MyChart is used to connect with patients for Virtual Visits (Telemedicine).  Patients are able to view lab/test results, encounter notes, upcoming appointments, etc.  Non-urgent messages can be sent to your provider as well.   To learn more about what you can do with MyChart, go to [email or web address].    Your next appointment:   5 month(s)  Provider:   Redell Cave, MD or Cadence Franchester, PA-C

## 2023-02-03 ENCOUNTER — Ambulatory Visit: Payer: Medicare HMO | Admitting: Physician Assistant

## 2023-02-03 VITALS — BP 131/88 | HR 79 | Ht 63.0 in | Wt 185.0 lb

## 2023-02-03 DIAGNOSIS — R972 Elevated prostate specific antigen [PSA]: Secondary | ICD-10-CM | POA: Diagnosis not present

## 2023-02-03 DIAGNOSIS — N401 Enlarged prostate with lower urinary tract symptoms: Secondary | ICD-10-CM

## 2023-02-03 DIAGNOSIS — N138 Other obstructive and reflux uropathy: Secondary | ICD-10-CM | POA: Diagnosis not present

## 2023-02-03 LAB — URINALYSIS, COMPLETE
Bilirubin, UA: NEGATIVE
Glucose, UA: NEGATIVE
Ketones, UA: NEGATIVE
Leukocytes,UA: NEGATIVE
Nitrite, UA: NEGATIVE
Protein,UA: NEGATIVE
RBC, UA: NEGATIVE
Specific Gravity, UA: >1.03 — ABNORMAL HIGH (ref 1.005–1.030)
Urobilinogen, Ur: 0.2 mg/dL (ref 0.2–1.0)
pH, UA: 5.5 (ref 5.0–7.5)

## 2023-02-03 LAB — MICROSCOPIC EXAMINATION: Bacteria, UA: NONE SEEN

## 2023-02-03 LAB — BLADDER SCAN AMB NON-IMAGING: Scan Result: 0

## 2023-02-03 MED ORDER — MIRABEGRON ER 50 MG PO TB24: 50.0000 mg | ORAL_TABLET | Freq: Every day | ORAL | 11 refills | Status: DC

## 2023-02-03 NOTE — Patient Instructions (Signed)
Prostate MRI Prep:  1- No ejaculation 48 hours prior to exam  2- No caffeine or carbonated beverages on day of the exam  3- Eat light diet evening prior and day of exam  4- Avoid eating 4 hours prior to exam  5- Fleets enema needs to be done 4 hours prior to exam -See below. Can be purchased at the drug store.

## 2023-02-03 NOTE — Progress Notes (Signed)
02/03/2023 3:26 PM   Bobby Woods 1945-07-26 [id number]  CC: Chief Complaint  Patient presents with   Urinary Frequency   HPI: Bobby Woods is a 78 y.o. male with PMH BPH on Flomax and finasteride, elevated PSA with benign biopsy in 2011, lumbar stenosis, and OAB wet with nocturia previously on Myrbetriq 25 mg who presents today for evaluation of rising PSA.   He had a PSA drawn by his PCP earlier this month after he reported urinary frequency and low back pain.  PSA history as below.  Today he reports chronic, progressive urgency, frequency every 30 to 45 minutes, nocturia x 4, and urge incontinence.  He does not wear absorbent products.  He remains on Flomax and finasteride long-term and has not had any interruptions in these medications. He is unsure when he stopped Myrbetriq.  He has a family history of prostate cancer in his father, diagnosed at age 25, deceased from prostate cancer at age 44. He has a cardiac pacemaker and some sort of implant in his eye following remote retinal detachment repair but has since undergone MRI. No claustrophobia.  PSA history: 01/20/2023: 11.97 07/20/2018: 4.05 07/08/2017: 4.6 07/07/2016: 4.08 04/25/2014: 3.15 01/03/2009: 5.52  In-office UA today and microscopy today pan negative. PVR 0mL.  PMH: Past Medical History:  Diagnosis Date   Arthritis    Basal cell carcinoma (BCC)    Blood transfusion without reported diagnosis    age 62 year    BPH (benign prostatic hypertrophy)    Chronic kidney disease    GERD (gastroesophageal reflux disease)    Hypertension    on medicines     Surgical History: Past Surgical History:  Procedure Laterality Date   COLONOSCOPY  2006   HAND SURGERY     INGUINAL HERNIA REPAIR  1965   PACEMAKER IMPLANT N/A 09/16/2019   Procedure: PACEMAKER IMPLANT;  Surgeon: Lanier Prude, MD;  Location: [facility];  Service: Cardiovascular;  Laterality: N/A;   PROSTATE BIOPSY  2011   negative   RETINAL  DETACHMENT SURGERY  1985   left, right 05/07   ROTATOR CUFF REPAIR  10/09   right   TONSILLECTOMY  1972    Home Medications:  Allergies as of 02/03/2023       Reactions   Amoxicillin Diarrhea   Azithromycin Dihydrate Rash   GI upset        Medication List        Accurate as of February 03, 2023  3:26 PM. If you have any questions, ask your nurse or doctor.          ALPRAZolam 0.25 MG tablet Commonly known as: XANAX Take 1 tablet (0.25 mg total) by mouth 2 (two) times daily as needed for anxiety.   amLODipine 5 MG tablet Commonly known as: NORVASC Take 1 tablet (5 mg total) by mouth daily.   clopidogrel 75 MG tablet Commonly known as: PLAVIX TAKE 1 TABLET BY MOUTH EVERY DAY   finasteride 5 MG tablet Commonly known as: PROSCAR TAKE 1 TABLET BY MOUTH EVERY DAY   hydrocortisone 2.5 % rectal cream Commonly known as: ANUSOL-HC Place 1 Application rectally at bedtime.   Magnesium Oxide 400 MG Caps Take 1 capsule (400 mg total) by mouth daily.   metoprolol succinate 25 MG 24 hr tablet Commonly known as: TOPROL-XL Take 2 tablets (50 mg total) by mouth in the morning and at bedtime.   mirabegron ER 50 MG Tb24 tablet Commonly known as: MYRBETRIQ  Take 1 tablet (50 mg total) by mouth daily. Started by: Carman Ching   pantoprazole 40 MG tablet Commonly known as: PROTONIX TAKE 1 TABLET BY MOUTH EVERY DAY   sulfamethoxazole-trimethoprim 800-160 MG tablet Commonly known as: BACTRIM DS Take 1 tablet by mouth 2 (two) times daily for 14 days.   tamsulosin 0.4 MG Caps capsule Commonly known as: FLOMAX TAKE 2 CAPSULES (0.8 MG TOTAL) BY MOUTH DAILY AFTER SUPPER.        Allergies:  Allergies  Allergen Reactions   Amoxicillin Diarrhea   Azithromycin Dihydrate Rash    GI upset    Family History: Family History  Problem Relation Age of Onset   Cancer Father        prostate   Cancer Paternal Uncle        prostate cancer   Colon cancer Neg Hx     Rectal cancer Neg Hx    Stomach cancer Neg Hx     Social History:   reports that he has never smoked. He has been exposed to tobacco smoke. He has never used smokeless tobacco. He reports that he does not drink alcohol and does not use drugs.  Physical Exam: BP 131/88   Pulse 79   Ht 5\' 3"  (1.6 m)   Wt 185 lb (83.9 kg)   BMI 32.77 kg/m   Constitutional:  Alert and oriented, no acute distress, nontoxic appearing HEENT: [place], AT Cardiovascular: No clubbing, cyanosis, or edema Respiratory: Normal respiratory effort, no increased work of breathing GU: Normal sphincter tone. Smooth, symmetrically enlarged 50+ cc prostate without nodules or induration. Exam limited to apex and mid-gland. Skin: No rashes, bruises or suspicious lesions Neurologic: Grossly intact, no focal deficits, moving all 4 extremities Psychiatric: Normal mood and affect  Laboratory Data: Results for orders placed or performed in visit on 02/03/23  Microscopic Examination   Collection Time: 02/03/23  2:42 PM   Urine  Result Value Ref Range   WBC, UA 0-5 0 - 5 /hpf   RBC, Urine 0-2 0 - 2 /hpf   Epithelial Cells (non renal) 0-10 0 - 10 /hpf   Bacteria, UA None seen None seen/Few  Urinalysis, Complete   Collection Time: 02/03/23  2:42 PM  Result Value Ref Range   Specific Gravity, UA >1.030 (H) 1.005 - 1.030   pH, UA 5.5 5.0 - 7.5   Color, UA Yellow Yellow   Appearance Ur Clear Clear   Leukocytes,UA Negative Negative   Protein,UA Negative Negative/Trace   Glucose, UA Negative Negative   Ketones, UA Negative Negative   RBC, UA Negative Negative   Bilirubin, UA Negative Negative   Urobilinogen, Ur 0.2 0.2 - 1.0 mg/dL   Nitrite, UA Negative Negative   Microscopic Examination See below:   BLADDER SCAN AMB NON-IMAGING   Collection Time: 02/03/23  2:43 PM  Result Value Ref Range   Scan Result 0    Assessment & Plan:   1. Rising PSA level (Primary) Rising PSA on Flomax and finasteride.  UA is bland and he is  emptying appropriately.  He does have an enlarged prostate, so overall PSA density and age are rather reassuring.  Additionally, DRE is benign today.  That said, given his family history of prostate cancer, we discussed options including repeat PSA in 3 months versus prostate MRI and he elected for the latter, which is reasonable.  We discussed that if there are any concerning lesions on MRI, we will pursue repeat biopsy. - Urinalysis, Complete - BLADDER  SCAN AMB NON-IMAGING - MR PROSTATE W WO CONTRAST; Future  2. BPH with obstruction/lower urinary tract symptoms Chronic, progressive urgency/frequency/nocturia/urge incontinence.  Will resume Myrbetriq and see him back in 6 weeks for symptom recheck and PVR. - mirabegron ER (MYRBETRIQ) 50 MG TB24 tablet; Take 1 tablet (50 mg total) by mouth daily.  Dispense: 30 tablet; Refill: 11   Return in about 6 weeks (around 03/17/2023) for Sx recheck, PVR, MRI results.  Carman Ching, PA-C  [facility] [place] [address], [address] [place], [place] [address] [phone]

## 2023-02-17 DIAGNOSIS — H538 Other visual disturbances: Secondary | ICD-10-CM | POA: Diagnosis not present

## 2023-02-24 DIAGNOSIS — L2989 Other pruritus: Secondary | ICD-10-CM | POA: Diagnosis not present

## 2023-02-24 DIAGNOSIS — L538 Other specified erythematous conditions: Secondary | ICD-10-CM | POA: Diagnosis not present

## 2023-02-24 DIAGNOSIS — L910 Hypertrophic scar: Secondary | ICD-10-CM | POA: Diagnosis not present

## 2023-03-04 ENCOUNTER — Ambulatory Visit (HOSPITAL_COMMUNITY): Payer: Medicare HMO

## 2023-03-13 ENCOUNTER — Ambulatory Visit (INDEPENDENT_AMBULATORY_CARE_PROVIDER_SITE_OTHER): Payer: Medicare PPO

## 2023-03-13 DIAGNOSIS — I495 Sick sinus syndrome: Secondary | ICD-10-CM

## 2023-03-16 LAB — CUP PACEART REMOTE DEVICE CHECK
Battery Remaining Longevity: 115 mo
Battery Voltage: 3.01 V
Brady Statistic AP VP Percent: 0.09 %
Brady Statistic AP VS Percent: 98.4 %
Brady Statistic AS VP Percent: 0 %
Brady Statistic AS VS Percent: 1.51 %
Brady Statistic RA Percent Paced: 98.51 %
Brady Statistic RV Percent Paced: 0.09 %
Date Time Interrogation Session: 20250227190615
Implantable Lead Connection Status: 753985
Implantable Lead Connection Status: 753985
Implantable Lead Implant Date: 20210903
Implantable Lead Implant Date: 20210903
Implantable Lead Location: 753859
Implantable Lead Location: 753860
Implantable Lead Model: 5076
Implantable Lead Model: 5076
Implantable Pulse Generator Implant Date: 20210903
Lead Channel Impedance Value: 285 Ohm
Lead Channel Impedance Value: 323 Ohm
Lead Channel Impedance Value: 323 Ohm
Lead Channel Impedance Value: 380 Ohm
Lead Channel Pacing Threshold Amplitude: 0.625 V
Lead Channel Pacing Threshold Amplitude: 0.625 V
Lead Channel Pacing Threshold Pulse Width: 0.4 ms
Lead Channel Pacing Threshold Pulse Width: 0.4 ms
Lead Channel Sensing Intrinsic Amplitude: 2 mV
Lead Channel Sensing Intrinsic Amplitude: 2 mV
Lead Channel Sensing Intrinsic Amplitude: 6.625 mV
Lead Channel Sensing Intrinsic Amplitude: 6.625 mV
Lead Channel Setting Pacing Amplitude: 1.5 V
Lead Channel Setting Pacing Amplitude: 2 V
Lead Channel Setting Pacing Pulse Width: 0.4 ms
Lead Channel Setting Sensing Sensitivity: 1.2 mV
Zone Setting Status: 755011
Zone Setting Status: 755011

## 2023-03-18 ENCOUNTER — Other Ambulatory Visit (HOSPITAL_COMMUNITY): Payer: Self-pay | Admitting: Ophthalmology

## 2023-03-18 DIAGNOSIS — H538 Other visual disturbances: Secondary | ICD-10-CM | POA: Diagnosis not present

## 2023-03-18 DIAGNOSIS — H5340 Unspecified visual field defects: Secondary | ICD-10-CM

## 2023-03-19 ENCOUNTER — Telehealth: Payer: Self-pay | Admitting: Physician Assistant

## 2023-03-19 NOTE — Telephone Encounter (Signed)
 Pt started taking Myrbetriq 50 mg 28 days ago.  He says he is still going frequently.  He went 4 times between 9-12 and from 9-5 around 11 times.  He didn't want to refill again since it's not working for him.

## 2023-03-19 NOTE — Telephone Encounter (Signed)
 LVM informing of gemesta samples at the front desk.

## 2023-03-19 NOTE — Telephone Encounter (Signed)
 Ok to switch him to [employer] and keep follow up with me next month with prostate MRI prior.

## 2023-03-19 NOTE — Telephone Encounter (Signed)
 LVM for pt to return call, Gemtesa samples left at the front desk

## 2023-03-24 ENCOUNTER — Ambulatory Visit: Payer: Self-pay | Admitting: Physician Assistant

## 2023-03-26 ENCOUNTER — Ambulatory Visit: Payer: Medicare HMO | Admitting: Physician Assistant

## 2023-03-27 DIAGNOSIS — Z85828 Personal history of other malignant neoplasm of skin: Secondary | ICD-10-CM | POA: Diagnosis not present

## 2023-03-27 DIAGNOSIS — D2272 Melanocytic nevi of left lower limb, including hip: Secondary | ICD-10-CM | POA: Diagnosis not present

## 2023-03-27 DIAGNOSIS — L570 Actinic keratosis: Secondary | ICD-10-CM | POA: Diagnosis not present

## 2023-03-27 DIAGNOSIS — D2362 Other benign neoplasm of skin of left upper limb, including shoulder: Secondary | ICD-10-CM | POA: Diagnosis not present

## 2023-03-27 DIAGNOSIS — D2271 Melanocytic nevi of right lower limb, including hip: Secondary | ICD-10-CM | POA: Diagnosis not present

## 2023-03-27 DIAGNOSIS — D2261 Melanocytic nevi of right upper limb, including shoulder: Secondary | ICD-10-CM | POA: Diagnosis not present

## 2023-03-27 DIAGNOSIS — D225 Melanocytic nevi of trunk: Secondary | ICD-10-CM | POA: Diagnosis not present

## 2023-03-27 DIAGNOSIS — D485 Neoplasm of uncertain behavior of skin: Secondary | ICD-10-CM | POA: Diagnosis not present

## 2023-03-27 DIAGNOSIS — Z08 Encounter for follow-up examination after completed treatment for malignant neoplasm: Secondary | ICD-10-CM | POA: Diagnosis not present

## 2023-03-27 DIAGNOSIS — D2262 Melanocytic nevi of left upper limb, including shoulder: Secondary | ICD-10-CM | POA: Diagnosis not present

## 2023-03-27 DIAGNOSIS — L821 Other seborrheic keratosis: Secondary | ICD-10-CM | POA: Diagnosis not present

## 2023-04-07 ENCOUNTER — Ambulatory Visit (HOSPITAL_COMMUNITY)
Admission: RE | Admit: 2023-04-07 | Discharge: 2023-04-07 | Disposition: A | Discharge status: Home / Self Care | Source: Ambulatory Visit | Attending: Ophthalmology | Admitting: Ophthalmology

## 2023-04-07 ENCOUNTER — Ambulatory Visit (HOSPITAL_COMMUNITY)
Admission: RE | Admit: 2023-04-07 | Discharge: 2023-04-07 | Disposition: A | Discharge status: Home / Self Care | Payer: Medicare HMO | Source: Ambulatory Visit | Attending: Physician Assistant | Admitting: Physician Assistant

## 2023-04-07 DIAGNOSIS — N400 Enlarged prostate without lower urinary tract symptoms: Secondary | ICD-10-CM | POA: Diagnosis not present

## 2023-04-07 DIAGNOSIS — R972 Elevated prostate specific antigen [PSA]: Secondary | ICD-10-CM | POA: Diagnosis not present

## 2023-04-07 DIAGNOSIS — H4489 Other disorders of globe: Secondary | ICD-10-CM | POA: Insufficient documentation

## 2023-04-07 DIAGNOSIS — N323 Diverticulum of bladder: Secondary | ICD-10-CM | POA: Diagnosis not present

## 2023-04-07 DIAGNOSIS — H5340 Unspecified visual field defects: Secondary | ICD-10-CM | POA: Insufficient documentation

## 2023-04-07 DIAGNOSIS — N4289 Other specified disorders of prostate: Secondary | ICD-10-CM | POA: Diagnosis not present

## 2023-04-07 DIAGNOSIS — K4090 Unilateral inguinal hernia, without obstruction or gangrene, not specified as recurrent: Secondary | ICD-10-CM | POA: Diagnosis not present

## 2023-04-07 DIAGNOSIS — I6782 Cerebral ischemia: Secondary | ICD-10-CM | POA: Diagnosis not present

## 2023-04-07 MED ORDER — GADOBUTROL 1 MMOL/ML IV SOLN
8.5000 mL | Freq: Once | INTRAVENOUS | Status: AC | PRN
  Administered 2023-04-07: 8.5 mL via INTRAVENOUS

## 2023-04-12 ENCOUNTER — Other Ambulatory Visit: Payer: Self-pay | Admitting: Internal Medicine

## 2023-04-13 NOTE — Progress Notes (Signed)
 Remote pacemaker transmission.

## 2023-04-13 NOTE — Addendum Note (Signed)
 Addended by: Elease Etienne A on: 04/13/2023 10:46 AM   Modules accepted: Orders

## 2023-04-15 DIAGNOSIS — H538 Other visual disturbances: Secondary | ICD-10-CM | POA: Diagnosis not present

## 2023-04-16 NOTE — Progress Notes (Unsigned)
 Electrophysiology Clinic Note    Date:  04/17/2023  Patient ID:  Bobby Woods, Bobby Woods 1945/03/15, MRN [id number] PCP:  Karie Schwalbe, MD  Cardiologist:  Debbe Odea, MD Electrophysiologist: Lanier Prude, MD   Discussed the use of AI scribe software for clinical note transcription with the patient, who gave verbal consent to proceed.   Patient Profile    Chief Complaint: 6mon follow-up  History of Present Illness: Bobby GRIEGER is a 78 y.o. male with PMH notable for praox SVT, tachy-brady s/p PPM, HTN; seen today for Lanier Prude, MD for routine electrophysiology followup.   I last saw him 10/2022 where his palpitations had improved. He had recently worn a 3 day monitor where symptom activation correlated with a single PVC. Toprol was continued.   On follow up today, he is doing well from a cardiac standpoint. He recently had to have two MRIs to evaluate his prostate after PSA found to be elevated. He has had some palpitations since the MRIs, but nothing significant. Prior to MRIs, he did not have any palpitations.  He has also noticed a significant change in his vision, which he obtained a CT to eval.  Separately, his wife has needed brain surgery.   He denies chest pain, chest pressure, syncope. He does have some dizziness when he bends down and stands up quickly, but the symptoms resolve quickly.     Arrhythmia/Device History MDT dual chamber PPM, imp 09/2019; dx SND        ROS:  Please see the history of present illness. All other systems are reviewed and otherwise negative.    Physical Exam    VS:  BP 110/70 (BP Location: Left Arm, Patient Position: Sitting, Cuff Size: Normal)   Pulse 73   Ht 5\' 3"  (1.6 m)   Wt 181 lb 8 oz (82.3 kg)   SpO2 97%   BMI 32.15 kg/m  BMI: Body mass index is 32.15 kg/m.  Wt Readings from Last 3 Encounters:  04/17/23 181 lb 8 oz (82.3 kg)  02/03/23 185 lb (83.9 kg)  01/23/23 186 lb 3.2 oz (84.5 kg)     GEN-  The patient is well appearing, alert and oriented x 3 today.   Lungs- Clear to ausculation bilaterally, normal work of breathing.  Heart- Regular rate and rhythm, no murmurs, rubs or gallops Extremities- No peripheral edema, warm, dry Skin-  device pocket well-healed, no tethering   Device interrogation done today and reviewed by myself:  Battery 9.5 years Lead thresholds, impedence, sensing stable  Brief NSVT and SVT episodes No changes made today   Studies Reviewed   Previous EP, cardiology notes.    EKG is ordered. Personal review of EKG from today shows:    EKG Interpretation Date/Time:  Friday April 17 2023 10:30:14 EDT Ventricular Rate:  73 PR Interval:  200 QRS Duration:  74 QT Interval:  378 QTC Calculation: 416 R Axis:   0  Text Interpretation: Atrial-paced rhythm When compared with ECG of 23-Jan-2023 10:39, No significant change was found Confirmed by Sherie Don [phone]) on 04/17/2023 10:45:00 AM    Long term monitor, 07/10/2022 Paroxysmal SVT noted, longest lasting 19 beats, fastest lasting 9 seconds. Overall PVCs with rare less than 1%, PACs also rare less than 1%.  TTE, 07/07/2022  1. Left ventricular ejection fraction, by estimation, is 60 to 65%. The left ventricle has normal function. The left ventricle has no regional wall motion abnormalities. There is mild left  ventricular hypertrophy. Left ventricular diastolic parameters are consistent with Grade I diastolic dysfunction (impaired relaxation). The average left ventricular global longitudinal strain is -17.6 %. The global longitudinal strain is normal.   2. Right ventricular systolic function is normal. The right ventricular size is normal.   3. The mitral valve is normal in structure. Mild mitral valve regurgitation.   4. The aortic valve is tricuspid. Aortic valve regurgitation is not visualized.   5. The inferior vena cava is normal in size with greater than 50% respiratory variability, suggesting right  atrial pressure of 3 mmHg.    Assessment and Plan     #) tachy-brady s/p PPM #) SVT #) PVC Well-controlled arrhythmia burden on 25mg  toprol daily and 400mg  mag ox He symptomatic feels better with less palpitations Device functioning well, see paceart for details Low VP         Current medicines are reviewed at length with the patient today.   The patient does not have concerns regarding his medicines.  The following changes were made today:  none  Labs/ tests ordered today include:  Orders Placed This Encounter  Procedures   EKG 12-Lead     Disposition: Follow up with Dr. Lalla Brothers or EP APP  in 6-9 months    Signed, Sherie Don, NP  04/17/23  11:19 AM  Electrophysiology [facility]

## 2023-04-17 ENCOUNTER — Encounter: Payer: Self-pay | Admitting: Cardiology

## 2023-04-17 ENCOUNTER — Ambulatory Visit: Payer: Medicare HMO | Attending: Cardiology | Admitting: Cardiology

## 2023-04-17 VITALS — BP 110/70 | HR 73 | Ht 63.0 in | Wt 181.5 lb

## 2023-04-17 DIAGNOSIS — I495 Sick sinus syndrome: Secondary | ICD-10-CM | POA: Diagnosis not present

## 2023-04-17 DIAGNOSIS — R002 Palpitations: Secondary | ICD-10-CM | POA: Diagnosis not present

## 2023-04-17 DIAGNOSIS — I493 Ventricular premature depolarization: Secondary | ICD-10-CM

## 2023-04-17 DIAGNOSIS — Z950 Presence of cardiac pacemaker: Secondary | ICD-10-CM | POA: Diagnosis not present

## 2023-04-17 LAB — CUP PACEART INCLINIC DEVICE CHECK
Date Time Interrogation Session: 20250404120538
Implantable Lead Connection Status: 753985
Implantable Lead Connection Status: 753985
Implantable Lead Implant Date: 20210903
Implantable Lead Implant Date: 20210903
Implantable Lead Location: 753859
Implantable Lead Location: 753860
Implantable Lead Model: 5076
Implantable Lead Model: 5076
Implantable Pulse Generator Implant Date: 20210903

## 2023-04-17 NOTE — Patient Instructions (Addendum)
 Medication Instructions:   No change to medications  *If you need a refill on your cardiac medications before your next appointment, please call your pharmacy*   Follow-Up: At [facility], you and your health needs are our priority.  As part of our continuing mission to provide you with exceptional heart care, our providers are all part of one team.  This team includes your primary Cardiologist (physician) and Advanced Practice Providers or APPs (Physician Assistants and Nurse Practitioners) who all work together to provide you with the care you need, when you need it.  Your next appointment:   6 month(s)  Provider:   Steffanie Dunn, MD or Sherie Don, NP    We recommend signing up for the patient portal called "MyChart".  Sign up information is provided on this After Visit Summary.  MyChart is used to connect with patients for Virtual Visits (Telemedicine).  Patients are able to view lab/test results, encounter notes, upcoming appointments, etc.  Non-urgent messages can be sent to your provider as well.   To learn more about what you can do with MyChart, go to [email or web address].   Other Instructions  Continue to check Blood Pressure 2-3 times a week and bring BP log to follow-up appointments with Primary care and Cardiology

## 2023-04-20 ENCOUNTER — Ambulatory Visit: Admitting: Physician Assistant

## 2023-04-20 VITALS — BP 116/81 | HR 76 | Ht 63.0 in | Wt 182.0 lb

## 2023-04-20 DIAGNOSIS — N401 Enlarged prostate with lower urinary tract symptoms: Secondary | ICD-10-CM | POA: Diagnosis not present

## 2023-04-20 DIAGNOSIS — R972 Elevated prostate specific antigen [PSA]: Secondary | ICD-10-CM | POA: Diagnosis not present

## 2023-04-20 DIAGNOSIS — N138 Other obstructive and reflux uropathy: Secondary | ICD-10-CM | POA: Diagnosis not present

## 2023-04-20 LAB — BLADDER SCAN AMB NON-IMAGING

## 2023-04-20 MED ORDER — GEMTESA 75 MG PO TABS: 75.0000 mg | ORAL_TABLET | Freq: Every day | ORAL | 11 refills | Status: DC

## 2023-04-20 NOTE — Progress Notes (Unsigned)
 04/20/2023 4:09 PM   [name] 08-23-1945 [id number]  CC: Chief Complaint  Patient presents with   Results   HPI: Bobby Woods is a 78 y.o. male with PMH BPH on Flomax and finasteride, elevated PSA with benign biopsy in 2011 and a family history of prostate cancer in his father, lumbar stenosis, and OAB wet with nocturia who presents today for follow-up of prostate MRI results and on Gemtesa.   I last saw him in clinic on 02/03/2023, at which point his PSA had risen to 11.97 from 4.05 5 years prior.  We elected to pursue prostate MRI and increased Myrbetriq from 25 to 50 mg to help with his voiding symptoms.  He subsequently called back to report that the Myrbetriq was not helping and I offered him Gemtesa samples as an alternative.  Today he reports significant improvement in urgency, frequency, and nocturia, now x2, on [place].  He has been having some dry mouth on Gemtesa but denies constipation or dry eye.  Overall, he found Myrbetriq to be expensive and ineffective for symptom management.  He is pleased with Leslye Peer as an alternative and wishes to continue it.  Prostate MRI dated 04/07/2023 was notable for 2 PI-RADS 4 lesions in the anterior transition zone with mild BPH and no pelvic adenopathy or bone metastasis.  PVR 0 mL.  PMH: Past Medical History:  Diagnosis Date   Arthritis    Basal cell carcinoma (BCC)    Blood transfusion without reported diagnosis    age 69 year    BPH (benign prostatic hypertrophy)    Chronic kidney disease    GERD (gastroesophageal reflux disease)    Hypertension    on medicines     Surgical History: Past Surgical History:  Procedure Laterality Date   COLONOSCOPY  2006   HAND SURGERY     INGUINAL HERNIA REPAIR  1965   PACEMAKER IMPLANT N/A 09/16/2019   Procedure: PACEMAKER IMPLANT;  Surgeon: Lanier Prude, MD;  [facility];  Service: Cardiovascular;  Laterality: N/A;   PROSTATE BIOPSY  2011   negative    RETINAL DETACHMENT SURGERY  1985   left, right 05/07   ROTATOR CUFF REPAIR  10/09   right   TONSILLECTOMY  1972    Home Medications:  Allergies as of 04/20/2023       Reactions   Amoxicillin Diarrhea   Azithromycin Dihydrate Rash   GI upset        Medication List        Accurate as of April 20, 2023  4:09 PM. If you have any questions, ask your nurse or doctor.          STOP taking these medications    mirabegron ER 50 MG Tb24 tablet Commonly known as: MYRBETRIQ       TAKE these medications    ALPRAZolam 0.25 MG tablet Commonly known as: XANAX Take 1 tablet (0.25 mg total) by mouth 2 (two) times daily as needed for anxiety.   amLODipine 5 MG tablet Commonly known as: NORVASC Take 1 tablet (5 mg total) by mouth daily.   clopidogrel 75 MG tablet Commonly known as: PLAVIX TAKE 1 TABLET BY MOUTH EVERY DAY   finasteride 5 MG tablet Commonly known as: PROSCAR TAKE 1 TABLET BY MOUTH EVERY DAY   hydrocortisone 2.5 % rectal cream Commonly known as: ANUSOL-HC Place 1 Application rectally at bedtime.   Magnesium Oxide 400 MG Caps Take 1 capsule (400 mg total)  by mouth daily.   metoprolol succinate 25 MG 24 hr tablet Commonly known as: TOPROL-XL TAKE 1 TABLET BY MOUTH EVERY DAY   pantoprazole 40 MG tablet Commonly known as: PROTONIX TAKE 1 TABLET BY MOUTH EVERY DAY   tamsulosin 0.4 MG Caps capsule Commonly known as: FLOMAX TAKE 2 CAPSULES (0.8 MG TOTAL) BY MOUTH DAILY AFTER SUPPER.        Allergies:  Allergies  Allergen Reactions   Amoxicillin Diarrhea   Azithromycin Dihydrate Rash    GI upset    Family History: Family History  Problem Relation Age of Onset   Cancer Father        prostate   Cancer Paternal Uncle        prostate cancer   Colon cancer Neg Hx    Rectal cancer Neg Hx    Stomach cancer Neg Hx     Social History:   reports that he has never smoked. He has been exposed to tobacco smoke. He has never used smokeless tobacco.  He reports that he does not drink alcohol and does not use drugs.  Physical Exam: BP 116/81   Pulse 76   Ht 5\' 3"  (1.6 m)   Wt 182 lb (82.6 kg)   BMI 32.24 kg/m   Constitutional:  Alert and oriented, no acute distress, nontoxic appearing HEENT: [place], AT Cardiovascular: No clubbing, cyanosis, or edema Respiratory: Normal respiratory effort, no increased work of breathing Skin: No rashes, bruises or suspicious lesions Neurologic: Grossly intact, no focal deficits, moving all 4 extremities Psychiatric: Normal mood and affect  Laboratory Data: Results for orders placed or performed in visit on 04/20/23  BLADDER SCAN AMB NON-IMAGING   Collection Time: 04/20/23  4:09 PM  Result Value Ref Range   Scan Result 0ml    Assessment & Plan:   1. BPH with obstruction/lower urinary tract symptoms (Primary) Significant symptomatic improvement and storage related voiding symptoms on Gemtesa.  He is emptying appropriately.  He failed Myrbetriq.  With his age and reports of dry mouth on Gemtesa, I anticipate he will have even more pronounced anticholinergic side effects on antimuscarinics and feel that these are contraindicated.  Will plan to continue Gemtesa. - BLADDER SCAN AMB NON-IMAGING - Vibegron (GEMTESA) 75 MG TABS; Take 1 tablet (75 mg total) by mouth daily.  Dispense: 30 tablet; Refill: 11  2. Rising PSA level 2 PI-RADS 4 lesions on prostate MRI with significant rise in PSA over a 5-year span, during which he has been on finasteride.  Will repeat PSA today and if it remains significantly elevated, will pursue fusion biopsy.  He is in agreement with this plan. - PSA Total (Reflex To Free); Future   Return for Will call with results.  Carman Ching, PA-C  [facility] [place] [address] [place], [place] [address] [phone]

## 2023-04-21 ENCOUNTER — Other Ambulatory Visit

## 2023-04-21 ENCOUNTER — Other Ambulatory Visit: Payer: Self-pay

## 2023-04-21 DIAGNOSIS — R972 Elevated prostate specific antigen [PSA]: Secondary | ICD-10-CM | POA: Diagnosis not present

## 2023-04-21 DIAGNOSIS — D2362 Other benign neoplasm of skin of left upper limb, including shoulder: Secondary | ICD-10-CM | POA: Diagnosis not present

## 2023-04-21 DIAGNOSIS — D2262 Melanocytic nevi of left upper limb, including shoulder: Secondary | ICD-10-CM | POA: Diagnosis not present

## 2023-04-21 MED ORDER — AMLODIPINE BESYLATE 5 MG PO TABS: 5.0000 mg | ORAL_TABLET | Freq: Every day | ORAL | 0 refills | Status: DC

## 2023-04-22 ENCOUNTER — Telehealth: Payer: Self-pay | Admitting: Physician Assistant

## 2023-04-22 LAB — PSA TOTAL (REFLEX TO FREE): Prostate Specific Ag, Serum: 9.9 ng/mL — ABNORMAL HIGH (ref 0.0–4.0)

## 2023-04-22 LAB — FPSA% REFLEX
% FREE PSA: 10.5 %
PSA, FREE: 1.04 ng/mL

## 2023-04-22 NOTE — Telephone Encounter (Signed)
 Patient called and states his pharmacy is charging $530 for his Bobby Woods that was prescribed. Patient did not get RX but would like to know if provider is able help him get prescription filled at a cheaper price. Please advise patient.

## 2023-04-24 NOTE — Progress Notes (Signed)
 His PSA has improved somewhat, but is still significantly elevated at 9.9.  Additionally, his free PSA is rather low, which is more consistent with possible prostate cancer.  Given his family history, I would recommend pursuing fusion biopsy at this time.  Please schedule.

## 2023-04-24 NOTE — Telephone Encounter (Signed)
 Please apply for a tier exception using my most recent office note.  He failed Myrbetriq and is having some anticholinergic side effects on Gemtesa, so I feel that antimuscarinics are contraindicated with that and his age.  Okay to offer him Gemtesa samples in the interim.

## 2023-04-27 NOTE — Telephone Encounter (Signed)
 LVM for pt to return call

## 2023-04-28 ENCOUNTER — Other Ambulatory Visit: Payer: Self-pay | Admitting: Cardiology

## 2023-04-28 ENCOUNTER — Ambulatory Visit: Payer: Medicare HMO | Admitting: Physician Assistant

## 2023-04-28 DIAGNOSIS — H2703 Aphakia, bilateral: Secondary | ICD-10-CM | POA: Diagnosis not present

## 2023-04-28 NOTE — Telephone Encounter (Signed)
Pt informed

## 2023-04-30 ENCOUNTER — Telehealth: Payer: Self-pay

## 2023-04-30 NOTE — Telephone Encounter (Signed)
 PA sent for Gemtesa through covermymeds

## 2023-04-30 NOTE — Telephone Encounter (Signed)
 LVM to informed pt that Bobby Woods will be covered by his insurance

## 2023-05-05 ENCOUNTER — Telehealth: Payer: Self-pay | Admitting: Physician Assistant

## 2023-05-05 DIAGNOSIS — R58 Hemorrhage, not elsewhere classified: Secondary | ICD-10-CM | POA: Diagnosis not present

## 2023-05-05 DIAGNOSIS — L538 Other specified erythematous conditions: Secondary | ICD-10-CM | POA: Diagnosis not present

## 2023-05-05 DIAGNOSIS — R208 Other disturbances of skin sensation: Secondary | ICD-10-CM | POA: Diagnosis not present

## 2023-05-05 DIAGNOSIS — L820 Inflamed seborrheic keratosis: Secondary | ICD-10-CM | POA: Diagnosis not present

## 2023-05-05 DIAGNOSIS — D485 Neoplasm of uncertain behavior of skin: Secondary | ICD-10-CM | POA: Diagnosis not present

## 2023-05-05 NOTE — Telephone Encounter (Signed)
 Patient dropped by office and stated that Gemtesa  is still too expensive, and he was told by pharmacy that he cannot use a Good Rx card for it. He is asking if there are any other options. He would like to have Gemtesa  because it is working well for him, but he cannot afford it. Please advise patient.

## 2023-05-07 NOTE — Telephone Encounter (Signed)
 He failed Myrbetriq  and antimuscarinics are contraindicated. Ok to keep him on Gemtesa  samples or bring him in to discuss third line therapies.

## 2023-05-08 NOTE — Telephone Encounter (Signed)
 LVM for pt to return call

## 2023-05-11 ENCOUNTER — Telehealth: Payer: Self-pay | Admitting: Cardiology

## 2023-05-11 NOTE — Telephone Encounter (Signed)
 Hey Suzann,  Mr. Ackland is requesting preoperative cardiac evaluation for fusion prostate biopsy.  He was recently seen by you in clinic on 04/17/2023.  Would you be able to comment on cardiac risk for his upcoming procedure.  Thank you for your help.  Please direct your response to CV DIV preop pool.  Chet Cota. Zacharee Gaddie NP-C     05/11/2023, 11:30 AM [facility] [address] Office [phone] Fax [phone]

## 2023-05-11 NOTE — Progress Notes (Signed)
 PERIOPERATIVE PRESCRIPTION FOR IMPLANTED CARDIAC DEVICE PROGRAMMING   Patient Information:  Patient: Bobby Woods  MRN: [id number]  Date of Birth: [birth year]      Procedure:   Fusion Prostate biopsy Date of Surgery:  Clearance 07/01/23                              Surgeon:  Dr. Cherylene Corrente Surgeon's Group or Practice Name:  [facility] Phone number:  [phone] Fax number:  [phone]   Device Information:   Clinic EP Physician:   Dr. Harvie Liner Device Type:  Pacemaker Manufacturer and Phone #:  Medtronic: [phone] Pacemaker Dependent?:  Yes Date of Last Device Check:  04/17/2023        Normal Device Function?:  Yes     Electrophysiologist's Recommendations:   Have magnet available. Provide continuous ECG monitoring when magnet is used or reprogramming is to be performed.  Procedure should not interfere with device function.  No device programming or magnet placement needed.  Per Device Clinic Standing Orders, Bobby Woods  05/11/2023 1:02 PM

## 2023-05-11 NOTE — Telephone Encounter (Signed)
 Device clinic, please provide recommendations for upcoming surgery related to patient's PPM.  Thank you for your help.  Chet Cota. Tanisia Yokley NP-C     05/11/2023, 11:27 AM [facility] [address] Office [phone] Fax [phone]

## 2023-05-11 NOTE — Telephone Encounter (Signed)
   Pre-operative Risk Assessment    Patient Name: Bobby Woods  DOB: [birth year] MRN: [id number]   Date of last office visit: unknown Date of next office visit: 06-23-2023   Request for Surgical Clearance    Procedure:   Fusion Prostate biopsy  Date of Surgery:  Clearance 07/01/23                                Surgeon:  Dr. Cherylene Corrente Surgeon's Group or Practice Name:  [facility] Phone number:  [phone] Fax number:  [phone]   Type of Clearance Requested:   - Pharmacy:  Hold Clopidogrel  (Plavix ) stop 7 days prior to procedure   Type of Anesthesia:  Not Indicated   Additional requests/questions:    [name]   05/11/2023, 9:46 AM

## 2023-05-11 NOTE — Telephone Encounter (Signed)
 LVM for pt to return call

## 2023-05-13 NOTE — Telephone Encounter (Signed)
   Patient Name: Bobby Woods  DOB: Jul 16, 1945 MRN: [id number]  Primary Cardiologist: Constancia Delton, MD  Chart reviewed as part of pre-operative protocol coverage.   Per review with EP nurse practitioner [name] as well as chart review, no specific contraindications to proceeding with biopsy as requested. However, our team cannot comment on clearance to hold Plavix  as requested. The patient is not on Plavix  for cardiac reasons and this is not prescribed by our office. Appears he has been prescribed this in the past by neurology and primary care. Recommend surgeon reach out to prescriber for input on holding prior to surgery.  Will route this bundled recommendation to requesting provider via Epic fax function. Please call with questions.  Mihailo Sage N Beena Catano, PA-C 05/13/2023, 2:10 PM

## 2023-05-14 ENCOUNTER — Encounter: Payer: Self-pay | Admitting: Internal Medicine

## 2023-05-14 ENCOUNTER — Ambulatory Visit (INDEPENDENT_AMBULATORY_CARE_PROVIDER_SITE_OTHER): Admitting: Internal Medicine

## 2023-05-14 VITALS — BP 104/78 | HR 72 | Temp 98.9°F | Ht 63.0 in | Wt 181.0 lb

## 2023-05-14 DIAGNOSIS — R5383 Other fatigue: Secondary | ICD-10-CM | POA: Diagnosis not present

## 2023-05-14 DIAGNOSIS — I495 Sick sinus syndrome: Secondary | ICD-10-CM | POA: Diagnosis not present

## 2023-05-14 NOTE — Progress Notes (Signed)
 Subjective:    Patient ID: Bobby Woods, male    DOB: 16-Aug-1945, 78 y.o.   MRN: [id number]  HPI Here due to fatigue  Past tachycardia problems Reports changes in metoprolol  dose up to 50 bid--has been taking this a year  For a couple of weeks, he is feeling fatigue and having trouble with energy to work after 3 hours or so Will eat a sandwich--and then feel better Does eat breakfast   Did feel his heart jump once last week No racing heart No chest pain or SOB  Having some ache in testes Is awaiting a prostate biopsy  Current Outpatient Medications on File Prior to Visit  Medication Sig Dispense Refill   ALPRAZolam  (XANAX ) 0.25 MG tablet Take 1 tablet (0.25 mg total) by mouth 2 (two) times daily as needed for anxiety. 20 tablet 0   amLODipine  (NORVASC ) 5 MG tablet Take 1 tablet (5 mg total) by mouth daily. 90 tablet 0   clopidogrel  (PLAVIX ) 75 MG tablet TAKE 1 TABLET BY MOUTH EVERY DAY 90 tablet 3   finasteride  (PROSCAR ) 5 MG tablet TAKE 1 TABLET BY MOUTH EVERY DAY 90 tablet 3   hydrocortisone  (ANUSOL -HC) 2.5 % rectal cream Place 1 Application rectally at bedtime. 30 g 1   magnesium  oxide (MAG-OX) 400 MG tablet TAKE 1 CAPSULE BY MOUTH DAILY. 30 tablet 5   metoprolol  succinate (TOPROL -XL) 25 MG 24 hr tablet TAKE 1 TABLET BY MOUTH EVERY DAY 90 tablet 3   pantoprazole  (PROTONIX ) 40 MG tablet TAKE 1 TABLET BY MOUTH EVERY DAY 90 tablet 3   tamsulosin  (FLOMAX ) 0.4 MG CAPS capsule TAKE 2 CAPSULES (0.8 MG TOTAL) BY MOUTH DAILY AFTER SUPPER. 180 capsule 3   Vibegron  (GEMTESA ) 75 MG TABS Take 1 tablet (75 mg total) by mouth daily. 30 tablet 11   No current facility-administered medications on file prior to visit.    Allergies  Allergen Reactions   Amoxicillin  Diarrhea   Azithromycin  Dihydrate Rash    GI upset    Past Medical History:  Diagnosis Date   Arthritis    Basal cell carcinoma (BCC)    Blood transfusion without reported diagnosis    age 86 year    BPH (benign  prostatic hypertrophy)    Chronic kidney disease    GERD (gastroesophageal reflux disease)    Hypertension    on medicines     Past Surgical History:  Procedure Laterality Date   COLONOSCOPY  2006   HAND SURGERY     INGUINAL HERNIA REPAIR  1965   PACEMAKER IMPLANT N/A 09/16/2019   Procedure: PACEMAKER IMPLANT;  Surgeon: Boyce Byes, MD;  Location: [facility];  Service: Cardiovascular;  Laterality: N/A;   PROSTATE BIOPSY  2011   negative   RETINAL DETACHMENT SURGERY  1985   left, right 05/07   ROTATOR CUFF REPAIR  10/09   right   TONSILLECTOMY  1972    Family History  Problem Relation Age of Onset   Cancer Father        prostate   Cancer Paternal Uncle        prostate cancer   Colon cancer Neg Hx    Rectal cancer Neg Hx    Stomach cancer Neg Hx     Social History   Socioeconomic History   Marital status: Married    Spouse name: Not on file   Number of children: 3   Years of education: Not on file   Highest education level:  Not on file  Occupational History   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    Employer: [employer]   Occupation: Quality assurance--cleaning    Employer: [employer]    Comment:     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  Tobacco Use   Smoking status: Never    Passive exposure: Past   Smokeless tobacco: Never  Vaping Use   Vaping status: Never Used  Substance and Sexual Activity   Alcohol use: No   Drug use: No   Sexual activity: Not on file  Other Topics Concern   Not on file  Social History Narrative   Has living will   Wife has [place] health care POA and alternate is Proofreader (cousin)   Would accept attempts at resuscitation but no prolonged artificial ventilation   Not prolonged feeding tube   Social Drivers of Health   Financial Resource Strain: Not on file  Food Insecurity: Not on file  Transportation Needs: Not on file  Physical Activity: Not on file  Stress: Not  on file  Social Connections: Not on file  Intimate Partner Violence: Not on file   Review of Systems One caffeinated coffee in the morning Sleeps fair--but up 2-3 times a night (better on gemtesa ) Eating fine    Objective:   Physical Exam Constitutional:      Appearance: Normal appearance.  Cardiovascular:     Rate and Rhythm: Normal rate and regular rhythm.     Heart sounds: No murmur heard.    No gallop.  Pulmonary:     Effort: Pulmonary effort is normal.     Breath sounds: Normal breath sounds. No wheezing or rales.  Abdominal:     Palpations: Abdomen is soft.     Tenderness: There is no abdominal tenderness.  Musculoskeletal:     Cervical back: Neck supple.     Right lower leg: No edema.     Left lower leg: No edema.  Lymphadenopathy:     Cervical: No cervical adenopathy.  Neurological:     Mental Status: He is alert.            Assessment & Plan:

## 2023-05-14 NOTE — Assessment & Plan Note (Signed)
 No sig palpitations now Will try cutting metoprolol  dose to see if it helps his energy

## 2023-05-14 NOTE — Patient Instructions (Signed)
 Please cut the metoprolol  dose to 50mg  once a day (probably in the morning). If the fatigue continues, you may want to try the metoprolol  in the evening

## 2023-05-14 NOTE — Assessment & Plan Note (Signed)
 Non specific Wears out at work Is taking more metoprolol  than I thought--but had been increased to 50 bid by cardiology Doesn't sound like hypoglycemia--though rest/food helps Will check labs Cut the metoprolol 

## 2023-05-15 LAB — COMPREHENSIVE METABOLIC PANEL WITH GFR
ALT: 16 U/L (ref 0–53)
AST: 19 U/L (ref 0–37)
Albumin: 4.3 g/dL (ref 3.5–5.2)
Alkaline Phosphatase: 53 U/L (ref 39–117)
BUN: 18 mg/dL (ref 6–23)
CO2: 27 meq/L (ref 19–32)
Calcium: 9.3 mg/dL (ref 8.4–10.5)
Chloride: 104 meq/L (ref 96–112)
Creatinine, Ser: 1.31 mg/dL (ref 0.40–1.50)
GFR: 52.5 mL/min — ABNORMAL LOW (ref >60.00)
Glucose, Bld: 98 mg/dL (ref 70–99)
Potassium: 4.2 meq/L (ref 3.5–5.1)
Sodium: 139 meq/L (ref 135–145)
Total Bilirubin: 1 mg/dL (ref 0.2–1.2)
Total Protein: 6.3 g/dL (ref 6.0–8.3)

## 2023-05-15 LAB — LIPID PANEL
Cholesterol: 119 mg/dL (ref 0–200)
HDL: 40.2 mg/dL (ref >39.00)
LDL Cholesterol: 52 mg/dL (ref 0–99)
NonHDL: 79.15
Total CHOL/HDL Ratio: 3
Triglycerides: 135 mg/dL (ref 0.0–149.0)
VLDL: 27 mg/dL (ref 0.0–40.0)

## 2023-05-15 LAB — TSH: TSH: 4.1 u[IU]/mL (ref 0.35–5.50)

## 2023-05-15 LAB — VITAMIN B12: Vitamin B-12: 172 pg/mL — ABNORMAL LOW (ref 211–911)

## 2023-05-17 LAB — CBC
HCT: 43.8 % (ref 39.0–52.0)
Hemoglobin: 14.9 g/dL (ref 13.0–17.0)
MCHC: 33.9 g/dL (ref 30.0–36.0)
MCV: 94.2 fl (ref 78.0–100.0)
Platelets: 213 10*3/uL (ref 150.0–400.0)
RBC: 4.65 Mil/uL (ref 4.22–5.81)
RDW: 13.2 % (ref 11.5–15.5)
WBC: 6.9 10*3/uL (ref 4.0–10.5)

## 2023-05-18 ENCOUNTER — Other Ambulatory Visit: Payer: Self-pay | Admitting: Internal Medicine

## 2023-05-18 ENCOUNTER — Telehealth: Payer: Self-pay | Admitting: Internal Medicine

## 2023-05-18 DIAGNOSIS — E538 Deficiency of other specified B group vitamins: Secondary | ICD-10-CM

## 2023-05-18 NOTE — Telephone Encounter (Signed)
Spoke to pt. Corrected med list.

## 2023-05-18 NOTE — Telephone Encounter (Signed)
 Let him know it is okay to go back up to the 50mg  twice a day. He will have to let us  know how his energy is

## 2023-06-12 ENCOUNTER — Telehealth: Payer: Self-pay | Admitting: Urology

## 2023-06-12 ENCOUNTER — Ambulatory Visit (INDEPENDENT_AMBULATORY_CARE_PROVIDER_SITE_OTHER): Payer: Medicare PPO

## 2023-06-12 DIAGNOSIS — I495 Sick sinus syndrome: Secondary | ICD-10-CM | POA: Diagnosis not present

## 2023-06-12 LAB — CUP PACEART REMOTE DEVICE CHECK
Battery Remaining Longevity: 112 mo
Battery Voltage: 3.01 V
Brady Statistic AP VP Percent: 0.1 %
Brady Statistic AP VS Percent: 98.84 %
Brady Statistic AS VP Percent: 0 %
Brady Statistic AS VS Percent: 1.06 %
Brady Statistic RA Percent Paced: 98.96 %
Brady Statistic RV Percent Paced: 0.1 %
Date Time Interrogation Session: 20250530031940
Implantable Lead Connection Status: 753985
Implantable Lead Connection Status: 753985
Implantable Lead Implant Date: 20210903
Implantable Lead Implant Date: 20210903
Implantable Lead Location: 753859
Implantable Lead Location: 753860
Implantable Lead Model: 5076
Implantable Lead Model: 5076
Implantable Pulse Generator Implant Date: 20210903
Lead Channel Impedance Value: 285 Ohm
Lead Channel Impedance Value: 304 Ohm
Lead Channel Impedance Value: 323 Ohm
Lead Channel Impedance Value: 361 Ohm
Lead Channel Pacing Threshold Amplitude: 0.625 V
Lead Channel Pacing Threshold Amplitude: 0.75 V
Lead Channel Pacing Threshold Pulse Width: 0.4 ms
Lead Channel Pacing Threshold Pulse Width: 0.4 ms
Lead Channel Sensing Intrinsic Amplitude: 2.125 mV
Lead Channel Sensing Intrinsic Amplitude: 2.125 mV
Lead Channel Sensing Intrinsic Amplitude: 6.75 mV
Lead Channel Sensing Intrinsic Amplitude: 6.75 mV
Lead Channel Setting Pacing Amplitude: 1.5 V
Lead Channel Setting Pacing Amplitude: 2 V
Lead Channel Setting Pacing Pulse Width: 0.4 ms
Lead Channel Setting Sensing Sensitivity: 1.2 mV
Zone Setting Status: 755011
Zone Setting Status: 755011

## 2023-06-12 NOTE — Telephone Encounter (Signed)
 Patient is scheduled for fusion biopsy on 07/01/23 with Dr. Estanislao Heimlich. He called to ask how long he should be out of work after procedure due to heavy lifting he does at his job. He is scheduled to work 6/19 and 6/21. Please advise patient.

## 2023-06-12 NOTE — Telephone Encounter (Signed)
 Called pt no answer. LM for pt informing him that heavy lifting would not be recommended the day of his biopsy and likely the following day, however this varies depending on how much he lifts for work and his level of bleeding after the procedure. Advised that we are happy to provide a work note if needed.

## 2023-06-14 ENCOUNTER — Ambulatory Visit: Payer: Self-pay | Admitting: Cardiology

## 2023-06-16 ENCOUNTER — Ambulatory Visit (INDEPENDENT_AMBULATORY_CARE_PROVIDER_SITE_OTHER)

## 2023-06-16 VITALS — BP 122/78 | Ht 63.5 in | Wt 181.2 lb

## 2023-06-16 DIAGNOSIS — Z0000 Encounter for general adult medical examination without abnormal findings: Secondary | ICD-10-CM

## 2023-06-16 NOTE — Patient Instructions (Signed)
 Mr. Bobby Woods , Thank you for taking time out of your busy schedule to complete your Annual Wellness Visit with me. I enjoyed our conversation and look forward to speaking with you again next year. I, as well as your care team,  appreciate your ongoing commitment to your health goals. Please review the following plan we discussed and let me know if I can assist you in the future. Your Game plan/ To Do List     Follow up Visits: Next Medicare AWV with our clinical staff: 06/16/24 3pm in person4/26   Have you seen your provider in the last 6 months (3 months if uncontrolled diabetes)? Yes Next Office Visit with your provider: 08/18/23  Clinician Recommendations:  Aim for 30 minutes of exercise or brisk walking, 6-8 glasses of water, and 5 servings of fruits and vegetables each day.       This is a list of the screening recommended for you and due dates:  Health Maintenance  Topic Date Due   Hepatitis C Screening  Never done   COVID-19 Vaccine (6 - 2024-25 season) 09/14/2022   DTaP/Tdap/Td vaccine (3 - Tdap) 02/09/2023   Zoster (Shingles) Vaccine (1 of 2) 08/13/2023*   Flu Shot  08/14/2023   Medicare Annual Wellness Visit  06/15/2024   Pneumonia Vaccine  Completed   HPV Vaccine  Aged Out   Meningitis B Vaccine  Aged Out   Colon Cancer Screening  Discontinued  *Topic was postponed. The date shown is not the original due date.    Advanced directives: (Copy Requested) Please bring a copy of your health care power of attorney and living will to the office to be added to your chart at your convenience. You can mail to [facility] [address] [place], [place] [address] or email to [email or web address] Advance Care Planning is important because it:  [x]  Makes sure you receive the medical care that is consistent with your values, goals, and preferences  [x]  It provides guidance to your family and loved ones and reduces their decisional burden about whether or not they are  making the right decisions based on your wishes.  Follow the link provided in your after visit summary or read over the paperwork we have mailed to you to help you started getting your Advance Directives in place. If you need assistance in completing these, please reach out to us  so that we can help you!

## 2023-06-16 NOTE — Progress Notes (Signed)
 Subjective:   Bobby Woods is a 78 y.o. who presents for a Medicare Wellness preventive visit.  As a reminder, Annual Wellness Visits don't include a physical exam, and some assessments may be limited, especially if this visit is performed virtually. We may recommend an in-person follow-up visit with your provider if needed.  Visit Complete: In person  Persons Participating in Visit: Patient.  AWV Questionnaire: No: Patient Medicare AWV questionnaire was not completed prior to this visit.  Cardiac Risk Factors include: advanced age (>11men, >48 women);hypertension;male gender;obesity (BMI >30kg/m2)     Objective:     Today's Vitals   06/16/23 1513  BP: 122/78  Weight: 181 lb 3.2 oz (82.2 kg)  Height: 5' 3.5" (1.613 m)   Body mass index is 31.59 kg/m.     06/16/2023    3:33 PM 10/11/2019    9:56 PM 09/16/2019    8:23 AM 01/01/2019    2:27 PM 05/01/2015   12:15 PM 06/26/2014   11:14 AM 04/25/2014   10:14 AM  Advanced Directives  Does Patient Have a Medical Advance Directive? Yes No Yes Yes Yes Yes Yes  Type of Estate agent of [place];Living will  Healthcare Power of [place];Living will  Healthcare Power of [place];Living will Healthcare Power of [place];Living will Healthcare Power of [place];Living will  Does patient want to make changes to medical advance directive?     No - Patient declined  No - Patient declined  Copy of Healthcare Power of Attorney in Chart? No - copy requested  No - copy requested  No - copy requested  No - copy requested    Current Medications (verified) Outpatient Encounter Medications as of 06/16/2023  Medication Sig   ALPRAZolam  (XANAX ) 0.25 MG tablet Take 1 tablet (0.25 mg total) by mouth 2 (two) times daily as needed for anxiety.   amLODipine  (NORVASC ) 5 MG tablet Take 1 tablet (5 mg total) by mouth daily.   clopidogrel  (PLAVIX ) 75 MG tablet TAKE 1 TABLET BY MOUTH EVERY DAY   finasteride  (PROSCAR ) 5 MG tablet TAKE 1  TABLET BY MOUTH EVERY DAY   hydrocortisone  (ANUSOL -HC) 2.5 % rectal cream Place 1 Application rectally at bedtime.   magnesium  oxide (MAG-OX) 400 MG tablet TAKE 1 CAPSULE BY MOUTH DAILY.   metoprolol  succinate (TOPROL -XL) 50 MG 24 hr tablet Take 50 mg by mouth 2 (two) times daily. Take with or immediately following a meal.   pantoprazole  (PROTONIX ) 40 MG tablet TAKE 1 TABLET BY MOUTH EVERY DAY   tamsulosin  (FLOMAX ) 0.4 MG CAPS capsule TAKE 2 CAPSULES (0.8 MG TOTAL) BY MOUTH DAILY AFTER SUPPER.   Vibegron  (GEMTESA ) 75 MG TABS Take 1 tablet (75 mg total) by mouth daily.   No facility-administered encounter medications on file as of 06/16/2023.    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   Procedure: PACEMAKER IMPLANT;  Surgeon: Boyce Byes, MD;  Location: [facility];  Service: Cardiovascular;  Laterality: N/A;   PROSTATE BIOPSY  2011   negative   RETINAL DETACHMENT SURGERY  1985   left, right 05/07   ROTATOR CUFF REPAIR  10/09   right   TONSILLECTOMY  1972   Family History  Problem Relation Age of Onset   Cancer Father        prostate   Cancer Paternal Uncle        prostate cancer   Colon cancer Neg Hx    Rectal cancer Neg Hx    Stomach cancer Neg Hx    Social History   Socioeconomic History   Marital status: Married    Spouse name: Not on file   Number of children: 3   Years of education: Not on file   Highest education level: Not on file  Occupational History   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[employer]   Retired 8/13    Employer: [employer]   Occupation: Quality assurance--cleaning    Employer: [employer]    Comment:     Occupation: Custodial/funeral assistance    Comment: [employer]  Tobacco Use   Smoking status: Never    Passive exposure: Past   Smokeless tobacco: Never  Vaping Use   Vaping status: Never Used  Substance and Sexual Activity   Alcohol use: No   Drug use: No   Sexual activity: Not on file  Other Topics Concern   Not on file  Social History Narrative   Has living will   Wife has [place] health care POA and alternate is Bartholomew Boros (cousin)   Would accept attempts at resuscitation but no prolonged artificial ventilation   Not prolonged feeding tube   Social Drivers of Health   Financial Resource Strain: Low Risk  (06/16/2023)   Overall Financial Resource Strain (CARDIA)    Difficulty of Paying Living Expenses: Not hard at all  Food Insecurity: No Food Insecurity (06/16/2023)   Hunger Vital Sign    Worried About Running Out of Food in the Last Year: Never true    Ran Out of Food in the Last Year: Never true  Transportation Needs: No Transportation Needs (06/16/2023)   PRAPARE - Administrator, Civil Service (Medical): No    Lack of Transportation (Non-Medical): No  Physical Activity: Sufficiently Active (06/16/2023)   Exercise Vital Sign    Days of Exercise per Week: 4 days    Minutes of Exercise per Session: 40 min  Stress: No Stress Concern Present (06/16/2023)   [employer] of Occupational Health - Occupational Stress Questionnaire    Feeling of Stress : Not at all  Social Connections: Moderately Isolated (06/16/2023)   Social Connection and Isolation Panel [NHANES]    Frequency of Communication with Friends and Family: Once a week    Frequency of Social Gatherings with Friends and Family: Never    Attends Religious Services: More than 4 times per year    Active Member of [employer] or Organizations: No    Attends Museum/gallery exhibitions officer: Never    Marital Status: Married    Tobacco Counseling Counseling given: Not Answered    Clinical Intake:  Pre-visit preparation completed: Yes  Pain : No/denies pain     BMI - recorded: 31.59 Nutritional Status: BMI > 30  Obese Nutritional Risks: None Diabetes: No  Lab Results  Component Value Date   HGBA1C 6.2 01/20/2023     How often do you need to have someone help you when you  read instructions, pamphlets, or other written materials from your doctor or pharmacy?: 1 - Never  Interpreter Needed?: No  Comments: lives with wife Information entered by :: B.Karren Newland,LPN   Activities of Daily Living     06/16/2023    3:34 PM  In your present state of health, do you have any difficulty performing the following activities:  Hearing? 0  Vision? 1  Difficulty concentrating or making decisions? 0  Walking or climbing stairs? 0  Dressing or bathing? 0  Doing errands, shopping? 0  Preparing Food and eating ? N  Using the Toilet? N  In the past six months, have you accidently leaked urine? Y  Do you have problems with loss of bowel control? N  Managing your Medications? N  Managing your Finances? N  Housekeeping or managing your Housekeeping? N    Patient Care Team: Helaine Llanos, MD as PCP - General Constancia Delton, MD as PCP - Cardiology (Cardiology) Boyce Byes, MD as PCP - Electrophysiology (Cardiology)  I have updated your Care Teams any recent Medical Services you may have received from other providers in the past year.     Assessment:    This is a routine wellness examination for Bobby Woods.  Hearing/Vision screen Hearing Screening - Comments:: Pt says his hearing is good with hearing aids Vision Screening - Comments:: Pt says his vision is some loss at 20/50 and blurry at times [facility]:sending to retina specialist   Goals Addressed             This Visit's Progress    Patient Stated        I want to keep managing my health       Depression Screen     06/16/2023    3:29 PM 05/14/2023    2:39 PM 08/12/2022    9:04 AM 08/12/2022    8:38 AM 08/02/2021    9:24 AM 07/27/2020    9:07 AM 07/22/2019    9:17 AM  PHQ 2/9 Scores  PHQ - 2 Score 0 0 1 0 1 1 0    Fall Risk     06/16/2023    3:16 PM 05/14/2023    2:39 PM 01/20/2023    3:34 PM 08/12/2022    9:04 AM 08/12/2022    8:37 AM  Fall Risk   Falls in the past year? 0 0 0 0 0  Number falls in past yr: 0 0 0  0  Injury with Fall? 0 0 0  0  Risk for fall due to : No Fall Risks No Fall Risks No Fall Risks  No Fall Risks  Follow up Education provided;Falls prevention discussed Falls evaluation completed Falls evaluation completed  Falls evaluation completed    MEDICARE RISK AT HOME:  Medicare Risk at Home Any stairs in or around the home?: No (ramp) If so, are there any without handrails?: No Home free of loose throw rugs in walkways, pet beds, electrical cords, etc?: Yes Adequate lighting in your home to reduce risk of falls?: Yes Life alert?: No Use of a cane, walker or w/c?: No Grab bars in the bathroom?: Yes Shower chair or bench in shower?: Yes Elevated toilet seat or a handicapped toilet?: No  TIMED UP AND GO:  Was the test performed?  Yes  Length of time to ambulate 10 feet: 11 sec Gait steady and fast without use of assistive device  Cognitive Function: 6CIT completed        06/16/2023  3:35 PM  6CIT Screen  What Year? 0 points  What month? 0 points  What time? 0 points  Count back from 20 0 points  Months in reverse 0 points  Repeat phrase 0 points  Total Score 0 points    Immunizations Immunization History  Administered Date(s) Administered   Fluad Quad(high Dose 65+) 10/08/2018, 09/27/2020, 10/28/2021   Fluad Trivalent(High Dose 65+) 10/01/2022   Influenza Split 10/14/2010, 10/27/2012   Influenza Whole 10/13/2005, 11/13/2008   Influenza, High Dose Seasonal PF 10/17/2015, 11/28/2016, 11/03/2017    Influenza, Seasonal, Injecte, Preservative Fre 10/14/2014   Influenza-Unspecified 11/07/2013, 11/08/2019   PFIZER(Purple Top)SARS-COV-2 Vaccination 03/07/2019, 03/28/2019, 01/24/2020   Pfizer Covid-19 Vaccine Bivalent Booster 59yrs & up 10/17/2020   Pfizer(Comirnaty)Fall Seasonal Vaccine 12 years and older 01/20/2022   Pneumococcal Conjugate-13 04/25/2014   Pneumococcal Polysaccharide-23 01/22/2011, 07/08/2017   Td 08/16/2002, 02/08/2013   Zoster, Live 09/24/2012    Screening Tests Health Maintenance  Topic Date Due   Hepatitis C Screening  Never done   COVID-19 Vaccine (6 - 2024-25 season) 09/14/2022   DTaP/Tdap/Td (3 - Tdap) 02/09/2023   Zoster Vaccines- Shingrix (1 of 2) 08/13/2023 (Originally 11/05/1964)   INFLUENZA VACCINE  08/14/2023   Medicare Annual Wellness (AWV)  06/15/2024   Pneumonia Vaccine 10+ Years old  Completed   HPV VACCINES  Aged Out   Meningococcal B Vaccine  Aged Out   Colonoscopy  Discontinued    Health Maintenance  Health Maintenance Due  Topic Date Due   Hepatitis C Screening  Never done   COVID-19 Vaccine (6 - 2024-25 season) 09/14/2022   DTaP/Tdap/Td (3 - Tdap) 02/09/2023   Health Maintenance Items Addressed: None needed at this time  Additional Screening:  Vision Screening: Recommended annual ophthalmology exams for early detection of glaucoma and other disorders of the eye. Would you like a referral to an eye doctor? No    Dental Screening: Recommended annual dental exams for proper oral hygiene  Community Resource Referral / Chronic Care Management: CRR required this visit?  No   CCM required this visit?  No   Plan:    I have personally reviewed and noted the following in the patient's chart:   Medical and social history Use of alcohol, tobacco or illicit drugs  Current medications and supplements including opioid prescriptions. Patient is not currently taking opioid prescriptions. Functional ability and status Nutritional  status Physical activity Advanced directives List of other physicians Hospitalizations, surgeries, and ER visits in previous 12 months Vitals Screenings to include cognitive, depression, and falls Referrals and appointments  In addition, I have reviewed and discussed with patient certain preventive protocols, quality metrics, and best practice recommendations. A written personalized care plan for preventive services as well as general preventive health recommendations were provided to patient.   Nerissa Bannister, LPN   01/18/1094   After Visit Summary: (In Person-Printed) AVS printed and given to the patient  Notes: Nothing significant to report at this time.

## 2023-06-17 NOTE — Addendum Note (Signed)
 Addended by: Nerissa Bannister on: 06/17/2023 08:29 AM   Modules accepted: Level of Service

## 2023-06-23 ENCOUNTER — Telehealth: Payer: Self-pay | Admitting: Urology

## 2023-06-23 ENCOUNTER — Telehealth: Payer: Self-pay | Admitting: Cardiology

## 2023-06-23 ENCOUNTER — Ambulatory Visit: Payer: Medicare HMO | Admitting: Cardiology

## 2023-06-23 NOTE — Telephone Encounter (Signed)
 Called pt informed him that his PCP as well as his cardiologist have been made aware of the need to stop his Plavix  7 days prior to his biopsy. Pt voiced understanding.

## 2023-06-23 NOTE — Telephone Encounter (Signed)
 Called pt no answer, pt has already received cardia clearance as documented in the chart.

## 2023-06-23 NOTE — Telephone Encounter (Addendum)
 Spoke with patient.  Pt concerned with increased palpitations that feels like "heart is beating out of his chest"; Denies chest pain or shortness of breath with palpitations; I was able to schedule him an appointment with Dr. Junnie Olives on 6/11.  Also sending message to device poole for interrogation report of pacemaker.  Pt appreciative to be able to be seen soon.

## 2023-06-23 NOTE — Telephone Encounter (Signed)
 Patient returned RN's call.

## 2023-06-23 NOTE — Telephone Encounter (Signed)
 Patient came by office States this morning around 4-5am it felt like heart beat was jumping out of his chest - happened about 4 times  Please call to discuss

## 2023-06-23 NOTE — Telephone Encounter (Signed)
 Attempted to contact patient.  Left Message to call back.

## 2023-06-23 NOTE — Telephone Encounter (Signed)
 Pt called saying that he was told to stop taking Plavix  a week before his Biopsy but needs to know if he needs Cardiac clarence fro that since he has a Peacemaker. Please advise patient on what he should do?

## 2023-06-24 ENCOUNTER — Encounter: Payer: Self-pay | Admitting: Cardiology

## 2023-06-24 ENCOUNTER — Ambulatory Visit: Attending: Cardiology | Admitting: Cardiology

## 2023-06-24 VITALS — BP 130/78 | HR 72 | Ht 63.0 in | Wt 181.6 lb

## 2023-06-24 DIAGNOSIS — I10 Essential (primary) hypertension: Secondary | ICD-10-CM

## 2023-06-24 DIAGNOSIS — R002 Palpitations: Secondary | ICD-10-CM

## 2023-06-24 DIAGNOSIS — I495 Sick sinus syndrome: Secondary | ICD-10-CM

## 2023-06-24 NOTE — Progress Notes (Signed)
 Cardiology Office Note:    Date:  06/24/2023   ID:  Bobby Woods, DOB 1945/09/08, MRN [id number]  PCP:  Helaine Llanos, MD  [facility] Cardiologist:  Constancia Delton, MD  [facility] Electrophysiologist:  Boyce Byes, MD   Referring MD: Helaine Llanos, MD   Chief Complaint  Patient presents with   Follow-up    F/u pt  has been doing well with no complaints of chest pain, chest pressure or SOB, had some heart palpations last night felt like it was jumping out of the chest , medication reviewed verbally with patient    History of Present Illness:    Bobby Woods is a 78 y.o. 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  Patient states having episodes of heart beating fast, felt like the pacemaker jolt 4 times yesterday.  Denies any prior symptoms, denies any symptoms since.  Compliant with his medications including metoprolol  as prescribed.  BP at home is adequately controlled.  Otherwise doing okay.  Prior notes Echocardiogram performed on 07/23/2019 showed normal systolic and diastolic function, EF 60 to 65%. 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 Coronary CTA, 08/2019 calcium score 0, no evidence of CAD.  Past Medical History:  Diagnosis Date   Arthritis    Basal cell carcinoma (BCC)    Blood transfusion without reported diagnosis    age 52 year    BPH (benign prostatic hypertrophy)    Chronic kidney disease    GERD (gastroesophageal reflux disease)    Hypertension    on medicines     Past Surgical History:  Procedure Laterality Date   COLONOSCOPY  2006   HAND SURGERY     INGUINAL HERNIA REPAIR  1965   PACEMAKER IMPLANT N/A 09/16/2019   Procedure: PACEMAKER IMPLANT;  Surgeon: Boyce Byes, MD;  [facility];  Service: Cardiovascular;  Laterality: N/A;   PROSTATE BIOPSY  2011   negative   RETINAL DETACHMENT SURGERY  1985   left, right 05/07   ROTATOR CUFF  REPAIR  10/09   right   TONSILLECTOMY  1972    Current Medications: Current Meds  Medication Sig   ALPRAZolam  (XANAX ) 0.25 MG tablet Take 1 tablet (0.25 mg total) by mouth 2 (two) times daily as needed for anxiety.   amLODipine  (NORVASC ) 5 MG tablet Take 1 tablet (5 mg total) by mouth daily.   clopidogrel  (PLAVIX ) 75 MG tablet TAKE 1 TABLET BY MOUTH EVERY DAY   cyanocobalamin  (VITAMIN B12) 1000 MCG tablet Take 1,000 mcg by mouth daily.   finasteride  (PROSCAR ) 5 MG tablet TAKE 1 TABLET BY MOUTH EVERY DAY   hydrocortisone  (ANUSOL -HC) 2.5 % rectal cream Place 1 Application rectally at bedtime.   magnesium  oxide (MAG-OX) 400 MG tablet TAKE 1 CAPSULE BY MOUTH DAILY.   metoprolol  succinate (TOPROL -XL) 50 MG 24 hr tablet Take 50 mg by mouth 2 (two) times daily. Take with or immediately following a meal.   pantoprazole  (PROTONIX ) 40 MG tablet TAKE 1 TABLET BY MOUTH EVERY DAY   tamsulosin  (FLOMAX ) 0.4 MG CAPS capsule TAKE 2 CAPSULES (0.8 MG TOTAL) BY MOUTH DAILY AFTER SUPPER.   Vibegron  (GEMTESA ) 75 MG TABS Take 1 tablet (75 mg total) by mouth daily.     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    Employer: [employer]   Occupation: Quality assurance--cleaning    Employer: [employer]    Comment:     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  Tobacco Use   Smoking status: Never    Passive exposure: Past   Smokeless tobacco: Never  Vaping Use   Vaping status: Never Used  Substance and Sexual Activity   Alcohol use: No   Drug use: No   Sexual activity: Not on file  Other Topics Concern   Not on file  Social History Narrative   Has living will   Wife has [facility] health care POA and  alternate is Bartholomew Boros (cousin)   Would accept attempts at resuscitation but no prolonged artificial ventilation   Not prolonged feeding tube   Social Drivers of Health   Financial Resource Strain: Low Risk  (06/16/2023)   Overall Financial Resource Strain (CARDIA)    Difficulty of Paying Living Expenses: Not hard at all  Food Insecurity: No Food Insecurity (06/16/2023)   Hunger Vital Sign    Worried About Running Out of Food in the Last Year: Never true    Ran Out of Food in the Last Year: Never true  Transportation Needs: No Transportation Needs (06/16/2023)   PRAPARE - Administrator, Civil Service (Medical): No    Lack of Transportation (Non-Medical): No  Physical Activity: Sufficiently Active (06/16/2023)   Exercise Vital Sign    Days of Exercise per Week: 4 days    Minutes of Exercise per Session: 40 min  Stress: No Stress Concern Present (06/16/2023)   [employer] of Occupational Health - Occupational Stress Questionnaire    Feeling of Stress : Not at all  Social Connections: Moderately Isolated (06/16/2023)   Social Connection and Isolation Panel [NHANES]    Frequency of Communication with Friends and Family: Once a week    Frequency of Social Gatherings with Friends and Family: Never    Attends Religious Services: More than 4 times per year    Active Member of [employer] or Organizations: No    Attends Banker Meetings: Never    Marital Status: Married     Family History: The patient's family history includes Cancer in his father and paternal uncle. There is no history of Colon cancer, Rectal cancer, or Stomach cancer.  ROS:   Please see the history of present illness.     All other systems reviewed and are negative.  EKGs/Labs/Other Studies Reviewed:    The following studies were reviewed today:   EKG Interpretation Date/Time:  Wednesday June 24 2023 09:42:13 EDT Ventricular Rate:  72 PR Interval:  224 QRS Duration:  62 QT  Interval:  370 QTC Calculation: 405 R Axis:   -9  Text Interpretation: Atrial-paced rhythm with prolonged AV conduction When compared with ECG of 17-Apr-2023 10:30, No significant change was found Confirmed by Constancia Delton ([id number]) on 06/24/2023 10:00:17 AM    Recent Labs: 05/14/2023: ALT 16; BUN 18; Creatinine, Ser 1.31; Hemoglobin 14.9; Platelets 213.0; Potassium 4.2; Sodium 139; TSH 4.10  Recent Lipid Panel    Component Value Date/Time   CHOL 119 05/14/2023 1510   TRIG 135.0 05/14/2023 1510   HDL 40.20 05/14/2023 1510   CHOLHDL 3 05/14/2023 1510   VLDL 27.0 05/14/2023 1510  LDLCALC 52 05/14/2023 1510    Physical Exam:    VS:  BP 130/78 (BP Location: Left Arm, Patient Position: Sitting, Cuff Size: Normal)   Pulse 72   Ht 5' 3 (1.6 m)   Wt 181 lb 9.6 oz (82.4 kg)   SpO2 97%   BMI 32.17 kg/m     Wt Readings from Last 3 Encounters:  06/24/23 181 lb 9.6 oz (82.4 kg)  06/16/23 181 lb 3.2 oz (82.2 kg)  05/14/23 181 lb (82.1 kg)     GEN:  Well nourished, well developed in no acute distress HEENT: Normal NECK: No JVD; No carotid bruits CARDIAC: RRR, no murmurs, rubs, gallops RESPIRATORY:  Clear to auscultation without rales, wheezing or rhonchi  ABDOMEN: Soft, non-tender, non-distended MUSCULOSKELETAL:  No edema; No deformity, chest tenderness noted on palpation. SKIN: Warm and dry NEUROLOGIC:  Alert and oriented x 3 PSYCHIATRIC:  Normal affect   ASSESSMENT:    1. Palpitations   2. Tachycardia-bradycardia syndrome (HCC)   3. Primary hypertension    PLAN:    In order of problems listed above:  Palpitations, occurred once.  Pacemaker interrogated today, no significant arrhythmias noted.  Fortunately this occurred once.  With no recurrent symptoms, denies any prior occurrence.  EKG today with pacemaker showing a paced rhythm. Continue Toprol -XL 50 mg twice daily.  Continue to monitor symptoms. tachy bradycardia syndrome s/p permanent pacemaker.  Pacemaker appears  to be functioning normally.  Pacemaker check as in #1 above.  Continue Toprol -XL 50 mg twice daily. History of hypertension, blood pressure controlled.  Continue Toprol -XL 50 mg twice daily, amlodipine  5 mg daily  Follow-up in 1 year   This note was generated in part or whole with voice recognition software. Voice recognition is usually quite accurate but there are transcription errors that can and very often do occur. I apologize for any typographical errors that were not detected and corrected.  Medication Adjustments/Labs and Tests Ordered: Current medicines are reviewed at length with the patient today.  Concerns regarding medicines are outlined above.  Orders Placed This Encounter  Procedures   EKG 12-Lead     No orders of the defined types were placed in this encounter.     Patient Instructions  Medication Instructions:  Your physician recommends that you continue on your current medications as directed. Please refer to the Current Medication list given to you today.   *If you need a refill on your cardiac medications before your next appointment, please call your pharmacy*  Lab Work: None ordered at this time   Follow-Up: At [facility], you and your health needs are our priority.  As part of our continuing mission to provide you with exceptional heart care, our providers are all part of one team.  This team includes your primary Cardiologist (physician) and Advanced Practice Providers or APPs (Physician Assistants and Nurse Practitioners) who all work together to provide you with the care you need, when you need it.  Your next appointment:   1 year(s)  Provider:   You may see Constancia Delton, MD or one of the following Advanced Practice Providers on your designated Care Team:   Laneta Pintos, NP Gildardo Labrador, PA-C Varney Gentleman, PA-C Cadence Gennaro Khat, PA-C Ronald Cockayne, NP Morey Ar, NP    Signed, Constancia Delton, MD  06/24/2023 11:21 AM     [facility]

## 2023-06-24 NOTE — Telephone Encounter (Signed)
 Remote transmission received 06/24/23. 1 NSVT event logged with duration 2 seconds.   Reports compliance with Toprol -XL 50 mg BID. No missed doses of medication.   Advised patient I will forward to Dr. Marven Slimmer to review.

## 2023-06-24 NOTE — Telephone Encounter (Signed)
 LM requesting patient send us  a manual transmission to assess.

## 2023-06-24 NOTE — Patient Instructions (Signed)
 Medication Instructions:  Your physician recommends that you continue on your current medications as directed. Please refer to the Current Medication list given to you today.   *If you need a refill on your cardiac medications before your next appointment, please call your pharmacy*  Lab Work: None ordered at this time   Follow-Up: At [facility], you and your health needs are our priority.  As part of our continuing mission to provide you with exceptional heart care, our providers are all part of one team.  This team includes your primary Cardiologist (physician) and Advanced Practice Providers or APPs (Physician Assistants and Nurse Practitioners) who all work together to provide you with the care you need, when you need it.  Your next appointment:   1 year(s)  Provider:   You may see Constancia Delton, MD or one of the following Advanced Practice Providers on your designated Care Team:   Laneta Pintos, NP Gildardo Labrador, PA-C Varney Gentleman, PA-C Cadence De Graff, PA-C Ronald Cockayne, NP Morey Ar, NP

## 2023-06-24 NOTE — Telephone Encounter (Signed)
 Patient called and advised of Dr. Candace Cerise recommendations. Patient was appreciative of call back.

## 2023-06-24 NOTE — Telephone Encounter (Signed)
 Pt called in letting us  know he sent in a transmission and would like a call back

## 2023-06-30 ENCOUNTER — Other Ambulatory Visit (INDEPENDENT_AMBULATORY_CARE_PROVIDER_SITE_OTHER)

## 2023-06-30 DIAGNOSIS — E538 Deficiency of other specified B group vitamins: Secondary | ICD-10-CM

## 2023-06-30 LAB — VITAMIN B12: Vitamin B-12: 405 pg/mL (ref 211–911)

## 2023-07-01 ENCOUNTER — Ambulatory Visit: Admitting: Urology

## 2023-07-01 ENCOUNTER — Encounter: Payer: Self-pay | Admitting: Urology

## 2023-07-01 ENCOUNTER — Ambulatory Visit: Payer: Self-pay | Admitting: Internal Medicine

## 2023-07-01 VITALS — BP 122/84 | HR 80

## 2023-07-01 DIAGNOSIS — R9389 Abnormal findings on diagnostic imaging of other specified body structures: Secondary | ICD-10-CM | POA: Diagnosis not present

## 2023-07-01 DIAGNOSIS — Z2989 Encounter for other specified prophylactic measures: Secondary | ICD-10-CM

## 2023-07-01 DIAGNOSIS — C61 Malignant neoplasm of prostate: Secondary | ICD-10-CM | POA: Diagnosis not present

## 2023-07-01 DIAGNOSIS — R972 Elevated prostate specific antigen [PSA]: Secondary | ICD-10-CM | POA: Diagnosis not present

## 2023-07-01 DIAGNOSIS — Z792 Long term (current) use of antibiotics: Secondary | ICD-10-CM

## 2023-07-01 MED ORDER — LEVOFLOXACIN 500 MG PO TABS
500.0000 mg | ORAL_TABLET | Freq: Once | ORAL | Status: AC
  Administered 2023-07-01: 500 mg via ORAL

## 2023-07-01 MED ORDER — GENTAMICIN SULFATE 40 MG/ML IJ SOLN
80.0000 mg | Freq: Once | INTRAMUSCULAR | Status: AC
  Administered 2023-07-01: 80 mg via INTRAMUSCULAR

## 2023-07-01 NOTE — Progress Notes (Signed)
   07/01/23  Indication: Elevated PSA, abnormal prostate MRI  MRI Fusion Prostate Biopsy Procedure   Informed consent was obtained, and we discussed the risks of bleeding and infection/sepsis. A time out was performed to ensure correct patient identity.  Pre-Procedure: - Last PSA Level: 9.9, corrected for finasteride  20 - Gentamicin  and levaquin given for antibiotic prophylaxis -Prostate measured 23 g on MRI, PSA density 0.87 - No median lobe  Procedure: - Prostate block performed using 10 cc 1% lidocaine   - MRI fusion biopsy was performed, and biopsies were taken from: ROI#1 PIRADS 4 lesion(2) ROI#2 PI-RADS 4 lesion(2)  - Standard biopsies taken from sextant areas, 12 under ultrasound guidance. - Total of 16 cores taken  Post-Procedure: - Patient tolerated the procedure well - He was counseled to seek immediate medical attention if experiences significant bleeding, fevers, or severe pain - Return in one week to discuss biopsy results  Assessment/ Plan: Will follow up in 1-2 weeks to discuss pathology with Dr. Derald Flattery, MD 07/01/2023

## 2023-07-01 NOTE — Patient Instructions (Addendum)

## 2023-07-02 ENCOUNTER — Other Ambulatory Visit: Payer: Self-pay | Admitting: Internal Medicine

## 2023-07-02 MED ORDER — VITAMIN B-12 1000 MCG PO TABS: 1000.0000 ug | ORAL_TABLET | Freq: Every day | ORAL | 4 refills | Status: AC

## 2023-07-02 NOTE — Telephone Encounter (Signed)
 Copied from CRM [id number]. Topic: Clinical - Medication Refill >> Jul 02, 2023  8:34 AM Marlan Silva wrote: Medication:  cyanocobalamin  (VITAMIN B12) 1000 MCG tablet   Has the patient contacted their pharmacy? Yes (Agent: If no, request that the patient contact the pharmacy for the refill. If patient does not wish to contact the pharmacy document the reason why and proceed with request.) (Agent: If yes, when and what did the pharmacy advise?)  This is the patient's preferred pharmacy:  [id number] - [place], [place] - [address] [place] [address] Phone: [phone] Fax: [phone]  Is this the correct pharmacy for this prescription? Yes If no, delete pharmacy and type the correct one.   Has the prescription been filled recently? Yes  Is the patient out of the medication? Yes  Has the patient been seen for an appointment in the last year OR does the patient have an upcoming appointment? Yes  Can we respond through MyChart? Yes  Agent: Please be advised that Rx refills may take up to 3 business days. We ask that you follow-up with your pharmacy.

## 2023-07-06 ENCOUNTER — Ambulatory Visit: Admitting: Medical

## 2023-07-08 ENCOUNTER — Ambulatory Visit: Admitting: Urology

## 2023-07-10 ENCOUNTER — Encounter: Payer: Self-pay | Admitting: Urology

## 2023-07-10 ENCOUNTER — Ambulatory Visit: Admitting: Urology

## 2023-07-10 VITALS — BP 121/79 | HR 77 | Ht 63.5 in | Wt 181.0 lb

## 2023-07-10 DIAGNOSIS — C61 Malignant neoplasm of prostate: Secondary | ICD-10-CM | POA: Diagnosis not present

## 2023-07-10 NOTE — Progress Notes (Unsigned)
 07/10/2023 6:16 PM   Prentiss MARLA [place] Dec 26, 1945 [id number]  Referring provider: Jimmy Charlie FERNS, MD [address] [place],  [place] [address]  Chief Complaint  Patient presents with   Results    HPI: Bobby Woods is a 78 y.o. male presents for prostate biopsy follow-up.  Prostate MRI performed 04/07/2023, PSA 11.97; 23 cc volume with PI-RADS 4 lesions x 2 in the anterior transition zone Benign DRE MR fusion biopsy performed by Dr. 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%, 8%); 8/12 template biopsies positive for adenocarcinoma: Gleason 4+5 RB, LM (26%, 31%); Gleason 4+4 RM, RML, LA (6%, 28%, 44%); Gleason 4+3 RA (62%) and Gleason 3+3 RBL, LML (5%, 5%) Prostate MRI showed no evidence of extracapsular disease   PMH: Past Medical History:  Diagnosis Date   Arthritis    Basal cell carcinoma (BCC)    Blood transfusion without reported diagnosis    age 46 year    BPH (benign prostatic hypertrophy)    Chronic kidney disease    GERD (gastroesophageal reflux disease)    Hypertension    on medicines     Surgical History: Past Surgical History:  Procedure Laterality Date   COLONOSCOPY  2006   HAND SURGERY     INGUINAL HERNIA REPAIR  1965   PACEMAKER IMPLANT N/A 09/16/2019   Procedure: PACEMAKER IMPLANT;  Surgeon: Cindie Ole DASEN, MD;  [facility];  Service: Cardiovascular;  Laterality: N/A;   PROSTATE BIOPSY  2011   negative   RETINAL DETACHMENT SURGERY  1985   left, right 05/07   ROTATOR CUFF REPAIR  10/09   right   TONSILLECTOMY  1972    Home Medications:  Allergies as of 07/10/2023       Reactions   Amoxicillin  Diarrhea   Azithromycin  Dihydrate Rash   GI upset        Medication List        Accurate as of July 10, 2023  6:16 PM. If you have any questions, ask your nurse or doctor.          ALPRAZolam  0.25 MG tablet Commonly known as: XANAX  Take 1  tablet (0.25 mg total) by mouth 2 (two) times daily as needed for anxiety.   amLODipine  5 MG tablet Commonly known as: NORVASC  Take 1 tablet (5 mg total) by mouth daily.   clopidogrel  75 MG tablet Commonly known as: PLAVIX  TAKE 1 TABLET BY MOUTH EVERY DAY   cyanocobalamin  1000 MCG tablet Commonly known as: VITAMIN B12 Take 1 tablet (1,000 mcg total) by mouth daily.   finasteride  5 MG tablet Commonly known as: PROSCAR  TAKE 1 TABLET BY MOUTH EVERY DAY   Gemtesa  75 MG Tabs Generic drug: Vibegron  Take 1 tablet (75 mg total) by mouth daily.   hydrocortisone  2.5 % rectal cream Commonly known as: ANUSOL -HC Place 1 Application rectally at bedtime.   magnesium  oxide 400 MG tablet Commonly known as: MAG-OX TAKE 1 CAPSULE BY MOUTH DAILY.   metoprolol  succinate 50 MG 24 hr tablet Commonly known as: TOPROL -XL Take 50 mg by mouth 2 (two) times daily. Take with or immediately following a meal.   pantoprazole  40 MG tablet Commonly known as: PROTONIX  TAKE 1 TABLET BY MOUTH EVERY DAY   tamsulosin  0.4 MG Caps capsule Commonly known as: FLOMAX  TAKE 2 CAPSULES (0.8 MG TOTAL) BY MOUTH DAILY AFTER SUPPER.        Allergies:  Allergies  Allergen Reactions  Amoxicillin  Diarrhea   Azithromycin  Dihydrate Rash    GI upset    Family History: Family History  Problem Relation Age of Onset   Cancer Father        prostate   Cancer Paternal Uncle        prostate cancer   Colon cancer Neg Hx    Rectal cancer Neg Hx    Stomach cancer Neg Hx     Social History:  reports that he has never smoked. He has been exposed to tobacco smoke. He has never used smokeless tobacco. He reports that he does not drink alcohol and does not use drugs.   Physical Exam: BP 121/79   Pulse 77   Ht 5' 3.5 (1.613 m)   Wt 181 lb (82.1 kg)   BMI 31.56 kg/m   Constitutional:  Alert and oriented, No acute distress. Psychiatric: Normal mood and affect.   Assessment & Plan:    1. Prostate cancer   The pathology report was discussed in detail with Bobby Woods and Bobby Woods NCCN risk ratification: Very high risk Recommend further staging with PSMA/PET and they will be notified with results We discussed potential management options including radiation therapy and ADT   Glendia JAYSON Barba, MD  [facility] [address] [place], [place] [address] [phone]

## 2023-07-17 ENCOUNTER — Other Ambulatory Visit: Payer: Self-pay | Admitting: Cardiology

## 2023-07-21 ENCOUNTER — Telehealth: Payer: Self-pay | Admitting: Physician Assistant

## 2023-07-21 DIAGNOSIS — N138 Other obstructive and reflux uropathy: Secondary | ICD-10-CM

## 2023-07-21 NOTE — Telephone Encounter (Signed)
 Unfortunately, we are no longer able to offer patients long-term Gemtesa  samples, as we are no longer receiving a supply to support this.  Alternatives include trospium, which may worsen his dry mouth, try Myrbetriq  again, though he failed this in the past, or coming into clinic to discuss third line OAB therapies.

## 2023-07-21 NOTE — Telephone Encounter (Signed)
 Patient stopped by wanting to know if he can have more samples of Gemtesa . Patient states he has about 3-4 tablets left. Please advise patient.

## 2023-07-22 ENCOUNTER — Ambulatory Visit
Admission: RE | Admit: 2023-07-22 | Discharge: 2023-07-22 | Disposition: A | Discharge status: Home / Self Care | Source: Ambulatory Visit | Attending: Urology | Admitting: Urology

## 2023-07-22 DIAGNOSIS — C61 Malignant neoplasm of prostate: Secondary | ICD-10-CM | POA: Insufficient documentation

## 2023-07-22 MED ORDER — FLOTUFOLASTAT F 18 GALLIUM 296-5846 MBQ/ML IV SOLN
8.0000 | Freq: Once | INTRAVENOUS | Status: AC
  Administered 2023-07-22: 8.35 via INTRAVENOUS
  Filled 2023-07-22: qty 8

## 2023-07-22 NOTE — Telephone Encounter (Signed)
 LVM for pt to return call

## 2023-07-23 MED ORDER — GEMTESA 75 MG PO TABS: 75.0000 mg | ORAL_TABLET | Freq: Every day | ORAL | Status: AC

## 2023-07-23 NOTE — Telephone Encounter (Signed)
 Pt called back to discuss alternative medications. Pt stated they received a call and would like a call back.

## 2023-07-23 NOTE — Addendum Note (Signed)
 Addended byBETHA CORIE PLATER on: 07/23/2023 02:40 PM   Modules accepted: Orders

## 2023-07-23 NOTE — Telephone Encounter (Signed)
 LVM for pt to schedule an appointment with Sam to discuss alternative medications/options

## 2023-07-24 ENCOUNTER — Telehealth: Payer: Self-pay | Admitting: Urology

## 2023-07-24 DIAGNOSIS — C61 Malignant neoplasm of prostate: Secondary | ICD-10-CM

## 2023-07-24 NOTE — Telephone Encounter (Signed)
 I contacted Bobby Woods to discuss his PSMA/PET results.  Scan remarkable for intense radiotracer activity in the anterior apical region of the prostate.  There was no evidence of extracapsular extension, pelvic or peritoneal adenopathy, visceral metastasis or skeletal metastasis.  Based on these findings he would be a candidate for curative therapy and is interested in a radiation oncology appointment.  Referral was placed.

## 2023-07-25 DIAGNOSIS — M25471 Effusion, right ankle: Secondary | ICD-10-CM | POA: Diagnosis not present

## 2023-07-25 DIAGNOSIS — M545 Low back pain, unspecified: Secondary | ICD-10-CM | POA: Diagnosis not present

## 2023-07-25 DIAGNOSIS — D6832 Hemorrhagic disorder due to extrinsic circulating anticoagulants: Secondary | ICD-10-CM | POA: Diagnosis not present

## 2023-07-27 DIAGNOSIS — Z8669 Personal history of other diseases of the nervous system and sense organs: Secondary | ICD-10-CM | POA: Diagnosis not present

## 2023-07-27 DIAGNOSIS — H4423 Degenerative myopia, bilateral: Secondary | ICD-10-CM | POA: Diagnosis not present

## 2023-07-27 DIAGNOSIS — H2703 Aphakia, bilateral: Secondary | ICD-10-CM | POA: Diagnosis not present

## 2023-07-27 DIAGNOSIS — H35103 Retinopathy of prematurity, unspecified, bilateral: Secondary | ICD-10-CM | POA: Diagnosis not present

## 2023-07-30 NOTE — Progress Notes (Signed)
 Remote pacemaker transmission.

## 2023-08-03 ENCOUNTER — Encounter: Payer: Self-pay | Admitting: Radiation Oncology

## 2023-08-03 ENCOUNTER — Ambulatory Visit
Admission: RE | Admit: 2023-08-03 | Discharge: 2023-08-03 | Disposition: A | Discharge status: Home / Self Care | Source: Ambulatory Visit | Attending: Radiation Oncology | Admitting: Radiation Oncology

## 2023-08-03 VITALS — Resp 16 | Ht 63.0 in | Wt 183.0 lb

## 2023-08-03 DIAGNOSIS — Z191 Hormone sensitive malignancy status: Secondary | ICD-10-CM | POA: Diagnosis not present

## 2023-08-03 DIAGNOSIS — C61 Malignant neoplasm of prostate: Secondary | ICD-10-CM

## 2023-08-03 NOTE — Consult Note (Signed)
 NEW PATIENT EVALUATION  Name: Bobby Woods  MRN: [id number]  Date:   08/03/2023     DOB: 09/25/1945   This 78 y.o. 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  REFERRING PHYSICIAN: Twylla Glendia BROCKS, MD  CHIEF COMPLAINT:  Chief Complaint  Patient presents with   Prostate Cancer    DIAGNOSIS: There were no encounter diagnoses.   PREVIOUS INVESTIGATIONS:  PSMA PET scan as well as MRI of prostate reviewed Clinical notes reviewed Pathology report reviewed  HPI: Patient is a 78 year old male who presented with a PSA in the 12 range back in March 2025.  He underwent an MRI prostate showing 2 PI-RADS category 4 lesions in the anterior transition zone.  No evidence of pelvic adenopathy or bone metastasis on MRI scan was noted.  He underwent neuronavigational biopsy showing 10 of 14 cores positive for adenocarcinoma a mixture of Gleason 9 (4+5) Gleason 8 (4+4) and Gleason 6 (3+3).  He had a PSMA PET scan performed showing intense radiotracer activity in the anterior apical region of the prostate gland consistent with primary prostate adenocarcinoma no evidence of metastatic adenopathy in the pelvis or periaortic lymph nodes.  No evidence of visceral metastasis or skeletal metastasis.  Patient is fairly asymptomatic does have nocturia x 3-4 no bone pain.  He has no bowel problems at this time.  He is seen today for radiation oncology opinion.  PLANNED TREATMENT REGIMEN: Image guided IMRT radiation therapy plus ADT therapy  PAST MEDICAL HISTORY:  has a past medical history of Arthritis, Basal cell carcinoma (BCC), Blood transfusion without reported diagnosis, BPH (benign prostatic hypertrophy), Chronic kidney disease, GERD (gastroesophageal reflux disease), and Hypertension.    PAST SURGICAL HISTORY:  Past Surgical History:  Procedure Laterality Date    COLONOSCOPY  2006   HAND SURGERY     INGUINAL HERNIA REPAIR  1965   PACEMAKER IMPLANT N/A 09/16/2019   Procedure: PACEMAKER IMPLANT;  Surgeon: Cindie Ole DASEN, MD;  [facility];  Service: Cardiovascular;  Laterality: N/A;   PROSTATE BIOPSY  2011   negative   RETINAL DETACHMENT SURGERY  1985   left, right 05/07   ROTATOR CUFF REPAIR  10/09   right   TONSILLECTOMY  1972    FAMILY HISTORY: family history includes Cancer in his father and paternal uncle.  SOCIAL HISTORY:  reports that he has never smoked. He has been exposed to tobacco smoke. He has never used smokeless tobacco. He reports that he does not drink alcohol and does not use drugs.  ALLERGIES: Amoxicillin  and Azithromycin  dihydrate  MEDICATIONS:  Current Outpatient Medications  Medication Sig Dispense Refill   ALPRAZolam  (XANAX ) 0.25 MG tablet Take 1 tablet (0.25 mg total) by mouth 2 (two) times daily as needed for anxiety. 20 tablet 0   amLODipine  (NORVASC ) 5 MG tablet Take 1 tablet (5 mg total) by mouth daily. 90 tablet 3   clopidogrel  (PLAVIX ) 75 MG tablet TAKE 1 TABLET BY MOUTH EVERY DAY 90 tablet 3   cyanocobalamin  (VITAMIN B12) 1000 MCG tablet Take 1 tablet (1,000 mcg total) by mouth daily. 100 tablet 4   finasteride  (PROSCAR ) 5 MG tablet TAKE 1 TABLET BY MOUTH EVERY DAY 90 tablet 3   hydrocortisone  (ANUSOL -HC) 2.5 % rectal cream Place 1 Application rectally at bedtime. 30 g 1   magnesium  oxide (MAG-OX) 400 MG tablet TAKE  1 CAPSULE BY MOUTH DAILY. 30 tablet 5   metoprolol  succinate (TOPROL -XL) 50 MG 24 hr tablet Take 50 mg by mouth 2 (two) times daily. Take with or immediately following a meal.     pantoprazole  (PROTONIX ) 40 MG tablet TAKE 1 TABLET BY MOUTH EVERY DAY 90 tablet 3   tamsulosin  (FLOMAX ) 0.4 MG CAPS capsule TAKE 2 CAPSULES (0.8 MG TOTAL) BY MOUTH DAILY AFTER SUPPER. 180 capsule 3   Vibegron  (GEMTESA ) 75 MG TABS Take 1 tablet (75 mg total) by mouth daily. 30 tablet 11   Vibegron  (GEMTESA )  75 MG TABS Take 1 tablet (75 mg total) by mouth daily for 18 days.     No current facility-administered medications for this encounter.    ECOG PERFORMANCE STATUS:  0 - Asymptomatic  REVIEW OF SYSTEMS: Patient denies any weight loss, fatigue, weakness, fever, chills or night sweats. Patient denies any loss of vision, blurred vision. Patient denies any ringing  of the ears or hearing loss. No irregular heartbeat. Patient denies heart murmur or history of fainting. Patient denies any chest pain or pain radiating to her upper extremities. Patient denies any shortness of breath, difficulty breathing at night, cough or hemoptysis. Patient denies any swelling in the lower legs. Patient denies any nausea vomiting, vomiting of blood, or coffee ground material in the vomitus. Patient denies any stomach pain. Patient states has had normal bowel movements no significant constipation or diarrhea. Patient denies any dysuria, hematuria or significant nocturia. Patient denies any problems walking, swelling in the joints or loss of balance. Patient denies any skin changes, loss of hair or loss of weight. Patient denies any excessive worrying or anxiety or significant depression. Patient denies any problems with insomnia. Patient denies excessive thirst, polyuria, polydipsia. Patient denies any swollen glands, patient denies easy bruising or easy bleeding. Patient denies any recent infections, allergies or URI. Patient s visual fields have not changed significantly in recent time.   PHYSICAL EXAM: Resp 16   Ht 5' 3 (1.6 m)   Wt 183 lb (83 kg)   BMI 32.42 kg/m  Well-developed well-nourished patient in NAD. HEENT reveals PERLA, EOMI, discs not visualized.  Oral cavity is clear. No oral mucosal lesions are identified. Neck is clear without evidence of cervical or supraclavicular adenopathy. Lungs are clear to A&P. Cardiac examination is essentially unremarkable with regular rate and rhythm without murmur rub or  thrill. Abdomen is benign with no organomegaly or masses noted. Motor sensory and DTR levels are equal and symmetric in the upper and lower extremities. Cranial nerves II through XII are grossly intact. Proprioception is intact. No peripheral adenopathy or edema is identified. No motor or sensory levels are noted. Crude visual fields are within normal range.  LABORATORY DATA: Pathology reports reviewed    RADIOLOGY RESULTS: MRI scan of prostate and PSMA PET scan both reviewed compatible with above-stated findings   IMPRESSION: Stage IIc adenocarcinoma the prostate in 78 year old male  PLAN: At this time I have done the [facility] which shows approximate 25% chance of lymph node involvement.  Based on the patient's age lack of evidence of adenopathy on both PSMA PET scan and MRI scan I will opt only to treat his prostate using image guided IMRT radiation therapy.  Would plan on delivering 50 Gray to the area of avid uptake on the PSMA PET scan treating the rest of his prostate gland to 80 Gray over 8 weeks.  Risks and benefits of treatment occluding increased lower Neri tract  symptoms diarrhea fatigue alteration blood counts possible skin reaction all were reviewed with the patient.  I have asked urology to place fiducial markers for daily image guided treatment and start the patient on 25-month Eligard depot.  Patient based on his Gleason score may benefit from at least 2 years of hormone suppression.  Patient comprehends my recommendations well.  We will set him up for simulation once we know when markers will be placed.  I would like to take this opportunity to thank you for allowing me to participate in the care of your patient.SABRA Marcey Penton, MD

## 2023-08-11 ENCOUNTER — Telehealth: Payer: Self-pay

## 2023-08-11 NOTE — Telephone Encounter (Signed)
 [facility]  Per CC pt needs Eligard and markers placed.   On plavix ???  If so will need clearance. -

## 2023-08-12 ENCOUNTER — Telehealth: Payer: Self-pay

## 2023-08-12 DIAGNOSIS — M25471 Effusion, right ankle: Secondary | ICD-10-CM | POA: Diagnosis not present

## 2023-08-12 DIAGNOSIS — M545 Low back pain, unspecified: Secondary | ICD-10-CM | POA: Diagnosis not present

## 2023-08-12 NOTE — Telephone Encounter (Signed)
 Appt made.   Pt aware he needs a fleets enema 2h prior. Bring markers with him. Cardiac Clearance sent to PCP. D/C Palvix 7 days prior.   Pt voiced understanding.

## 2023-08-12 NOTE — Telephone Encounter (Signed)
 LM to contact office.  Pt needs appt for Eligard and markers.   Cardiac clearance sent.

## 2023-08-12 NOTE — Telephone Encounter (Signed)
 [facility] [place] Phone: [phone] Fax: [phone] Cardiac Clearance  FMW:[id number]  Patient Name: Bobby Woods DOB: 1945-11-10 Procedure:Gold Seed Markers Placed Date of Procedure: Cardiologist/PCP: Dr. Jimmy Phone/Fax- [phone] Reason for Clearance:____Plavix 75 mg stop 7 days prior_____________  *Please complete note and fax back to our office. *  Clearance Note:  _____________________________________________________________________________________  Please sign:  Dr:__________________________________________________________________

## 2023-08-12 NOTE — Telephone Encounter (Signed)
 Pt called back and he already has the markers. He is on Plavix  so we will need a clearance from cardio.

## 2023-08-13 ENCOUNTER — Ambulatory Visit: Admitting: Physician Assistant

## 2023-08-13 VITALS — BP 104/72 | HR 85 | Ht 63.5 in | Wt 182.8 lb

## 2023-08-13 DIAGNOSIS — R351 Nocturia: Secondary | ICD-10-CM

## 2023-08-13 DIAGNOSIS — N401 Enlarged prostate with lower urinary tract symptoms: Secondary | ICD-10-CM

## 2023-08-13 MED ORDER — TROSPIUM CHLORIDE ER 60 MG PO CP24: 1.0000 | ORAL_CAPSULE | Freq: Every day | ORAL | 11 refills | Status: DC

## 2023-08-13 NOTE — Progress Notes (Unsigned)
 08/13/2023 4:03 PM   Bobby Woods May 04, 1945 [id number]  CC: Chief Complaint  Patient presents with   Other   HPI: Bobby Woods is a 78 y.o. male with PMH BPH on Flomax  and finasteride , recently diagnosed very high risk prostate cancer scheduled to start IMRT soon, lumbar stenosis, and OAB wet with nocturia previously on Gemtesa  samples who presents today to discuss Gemtesa  alternatives.   Today he reports recent issues related to his degenerative disc disease.  He is doing physical therapy.  Unfortunately, we are no longer able to keep him on Gemtesa  samples.  He denies constipation, dry mouth, or dry eye.  He previously failed Myrbetriq .  PMH: Past Medical History:  Diagnosis Date   Arthritis    Basal cell carcinoma (BCC)    Blood transfusion without reported diagnosis    age 65 year    BPH (benign prostatic hypertrophy)    Chronic kidney disease    GERD (gastroesophageal reflux disease)    Hypertension    on medicines     Surgical History: Past Surgical History:  Procedure Laterality Date   COLONOSCOPY  2006   HAND SURGERY     INGUINAL HERNIA REPAIR  1965   PACEMAKER IMPLANT N/A 09/16/2019   Procedure: PACEMAKER IMPLANT;  Surgeon: Cindie Ole DASEN, MD;  Location: [facility];  Service: Cardiovascular;  Laterality: N/A;   PROSTATE BIOPSY  2011   negative   RETINAL DETACHMENT SURGERY  1985   left, right 05/07   ROTATOR CUFF REPAIR  10/09   right   TONSILLECTOMY  1972    Home Medications:  Allergies as of 08/13/2023       Reactions   Amoxicillin  Diarrhea   Azithromycin  Dihydrate Rash   GI upset        Medication List        Accurate as of August 13, 2023  4:03 PM. If you have any questions, ask your nurse or doctor.          ALPRAZolam  0.25 MG tablet Commonly known as: XANAX  Take 1 tablet (0.25 mg total) by mouth 2 (two) times daily as needed for anxiety.   amLODipine  5 MG tablet Commonly known as: NORVASC  Take 1 tablet (5 mg  total) by mouth daily.   clopidogrel  75 MG tablet Commonly known as: PLAVIX  TAKE 1 TABLET BY MOUTH EVERY DAY   cyanocobalamin  1000 MCG tablet Commonly known as: VITAMIN B12 Take 1 tablet (1,000 mcg total) by mouth daily.   finasteride  5 MG tablet Commonly known as: PROSCAR  TAKE 1 TABLET BY MOUTH EVERY DAY   Gemtesa  75 MG Tabs Generic drug: Vibegron  Take 1 tablet (75 mg total) by mouth daily.   hydrocortisone  2.5 % rectal cream Commonly known as: ANUSOL -HC Place 1 Application rectally at bedtime.   magnesium  oxide 400 MG tablet Commonly known as: MAG-OX TAKE 1 CAPSULE BY MOUTH DAILY.   metoprolol  succinate 50 MG 24 hr tablet Commonly known as: TOPROL -XL Take 50 mg by mouth 2 (two) times daily. Take with or immediately following a meal.   pantoprazole  40 MG tablet Commonly known as: PROTONIX  TAKE 1 TABLET BY MOUTH EVERY DAY   tamsulosin  0.4 MG Caps capsule Commonly known as: FLOMAX  TAKE 2 CAPSULES (0.8 MG TOTAL) BY MOUTH DAILY AFTER SUPPER.        Allergies:  Allergies  Allergen Reactions   Amoxicillin  Diarrhea   Azithromycin  Dihydrate Rash    GI upset    Family History: Family History  Problem Relation Age of Onset   Cancer Father        prostate   Cancer Paternal Uncle        prostate cancer   Colon cancer Neg Hx    Rectal cancer Neg Hx    Stomach cancer Neg Hx     Social History:   reports that he has never smoked. He has been exposed to tobacco smoke. He has never used smokeless tobacco. He reports that he does not drink alcohol and does not use drugs.  Physical Exam: There were no vitals taken for this visit.  Constitutional:  Alert and oriented, no acute distress, nontoxic appearing HEENT: [place], AT Cardiovascular: No clubbing, cyanosis, or edema Respiratory: Normal respiratory effort, no increased work of breathing GI: Abdomen is soft, nontender, nondistended, no abdominal masses GU: No CVA tenderness Lymph: No cervical or inguinal  lymphadenopathy Skin: No rashes, bruises or suspicious lesions Neurologic: Grossly intact, no focal deficits, moving all 4 extremities Psychiatric: Normal mood and affect  Laboratory Data: Lab Results  Component Value Date   WBC 6.9 05/14/2023   HGB 14.9 05/14/2023   HCT 43.8 05/14/2023   MCV 94.2 05/14/2023   PLT 213.0 05/14/2023    Lab Results  Component Value Date   CREATININE 1.31 05/14/2023    CrCl cannot be calculated (Patient's most recent lab result is older than the maximum 21 days allowed.).  Results for orders placed or performed in visit on 06/30/23  Vitamin B12   Collection Time: 06/30/23 11:06 AM  Result Value Ref Range   Vitamin B-12 405 211 - 911 pg/mL    Pertinent Imaging: KUB, ***: *** No results found for this or any previous visit.  No results found for this or any previous visit.  No results found for this or any previous visit.  No results found for this or any previous visit.  No results found for this or any previous visit.  No results found for this or any previous visit.  No results found for this or any previous visit.  No results found for this or any previous visit.   I personally reviewed the images referenced above and note ***.  Assessment & Plan:   There are no diagnoses linked to this encounter.  No follow-ups on file.  Lucie Hones, PA-C  [facility] [place] [address] [place], [place] [address] [phone]

## 2023-08-14 ENCOUNTER — Telehealth: Payer: Self-pay

## 2023-08-14 ENCOUNTER — Encounter: Payer: Medicare HMO | Admitting: Internal Medicine

## 2023-08-14 NOTE — Telephone Encounter (Signed)
..     Pre-operative Risk Assessment    Patient Name: Bobby Woods  DOB: [birth year] MRN: [id number]   Date of last office visit: 06/24/23 Date of next office visit: NONE   Request for Surgical Clearance    Procedure:  GOLD SEED MARKERS PLACED  Date of Surgery:  Clearance TBD                                Surgeon:  MELODIE Socks Group or Practice Name:  [facility] Phone number:  [phone] Fax number:  [phone]   Type of Clearance Requested:   - Medical  - Pharmacy:  Hold Clopidogrel  (Plavix ) HOLD FOR 7 DAYS   Type of Anesthesia:  Not Indicated   Additional requests/questions:    [name] [name]   08/14/2023, 11:50 AM

## 2023-08-14 NOTE — Telephone Encounter (Signed)
     Primary Cardiologist: Redell Cave, MD  Chart reviewed as part of pre-operative protocol coverage. Given past medical history and time since last visit, based on ACC/AHA guidelines, Bobby Woods would be at acceptable risk for the planned procedure without further cardiovascular testing.   Patient's Plavix  is managed by his PCP.  Recommendations for holding Plavix  will need to come from Dr. Jimmy  I will route this recommendation to the requesting party via Epic fax function and remove from pre-op pool.  Please call with questions.  Josefa HERO. Conleigh Heinlein NP-C     08/14/2023, 12:44 PM [facility] [address] Office [phone] Fax [phone]

## 2023-08-18 ENCOUNTER — Ambulatory Visit (INDEPENDENT_AMBULATORY_CARE_PROVIDER_SITE_OTHER): Admitting: Internal Medicine

## 2023-08-18 ENCOUNTER — Encounter: Payer: Self-pay | Admitting: Internal Medicine

## 2023-08-18 VITALS — BP 122/88 | HR 73 | Temp 98.6°F | Ht 63.5 in | Wt 183.0 lb

## 2023-08-18 DIAGNOSIS — I10 Essential (primary) hypertension: Secondary | ICD-10-CM

## 2023-08-18 DIAGNOSIS — Z0000 Encounter for general adult medical examination without abnormal findings: Secondary | ICD-10-CM | POA: Diagnosis not present

## 2023-08-18 DIAGNOSIS — Z1211 Encounter for screening for malignant neoplasm of colon: Secondary | ICD-10-CM

## 2023-08-18 DIAGNOSIS — K219 Gastro-esophageal reflux disease without esophagitis: Secondary | ICD-10-CM | POA: Diagnosis not present

## 2023-08-18 DIAGNOSIS — N1831 Chronic kidney disease, stage 3a: Secondary | ICD-10-CM

## 2023-08-18 DIAGNOSIS — C61 Malignant neoplasm of prostate: Secondary | ICD-10-CM

## 2023-08-18 NOTE — Progress Notes (Signed)
 Subjective:    Patient ID: Bobby Woods, male    DOB: 1945-04-24, 78 y.o.   MRN: [id number]  HPI Here for physical  Recent diagnosis of prostate cancer Awaiting RT--starting next week with the markers Will be out of work during this--then reassess  Having some right low back pain Went to [facility]--found disc disease Got gabapentin twice a day and steroids Now having some leg itching--but it has helped Starting with PT at [facility]  Reviewed labs GFR 45-50's over the past few years  Having trouble with vision---blurred in left eye Went to [facility] retina specialist----now going to [facility]  Mood is better Hasn't really used the xanax  No depression now  Current Outpatient Medications on File Prior to Visit  Medication Sig Dispense Refill   ALPRAZolam  (XANAX ) 0.25 MG tablet Take 1 tablet (0.25 mg total) by mouth 2 (two) times daily as needed for anxiety. 20 tablet 0   amLODipine  (NORVASC ) 5 MG tablet Take 1 tablet (5 mg total) by mouth daily. 90 tablet 3   clopidogrel  (PLAVIX ) 75 MG tablet TAKE 1 TABLET BY MOUTH EVERY DAY 90 tablet 3   cyanocobalamin  (VITAMIN B12) 1000 MCG tablet Take 1 tablet (1,000 mcg total) by mouth daily. 100 tablet 4   finasteride  (PROSCAR ) 5 MG tablet TAKE 1 TABLET BY MOUTH EVERY DAY 90 tablet 3   gabapentin (NEURONTIN) 100 MG capsule Take 100 mg by mouth 2 (two) times daily.     hydrocortisone  (ANUSOL -HC) 2.5 % rectal cream Place 1 Application rectally at bedtime. 30 g 1   magnesium  oxide (MAG-OX) 400 MG tablet TAKE 1 CAPSULE BY MOUTH DAILY. 30 tablet 5   methylPREDNISolone (MEDROL DOSEPAK) 4 MG TBPK tablet Take by mouth.     metoprolol  succinate (TOPROL -XL) 50 MG 24 hr tablet Take 50 mg by mouth 2 (two) times daily. Take with or immediately following a meal.     pantoprazole  (PROTONIX ) 40 MG tablet TAKE 1 TABLET BY MOUTH EVERY DAY 90 tablet 3   tamsulosin  (FLOMAX ) 0.4 MG CAPS capsule TAKE 2 CAPSULES (0.8 MG TOTAL) BY MOUTH DAILY AFTER SUPPER.  180 capsule 3   Trospium  Chloride 60 MG CP24 Take 1 capsule (60 mg total) by mouth daily. 30 capsule 11   No current facility-administered medications on file prior to visit.    Allergies  Allergen Reactions   Amoxicillin  Diarrhea   Azithromycin  Dihydrate Rash    GI upset    Past Medical History:  Diagnosis Date   Arthritis    Basal cell carcinoma (BCC)    Blood transfusion without reported diagnosis    age 35 year    BPH (benign prostatic hypertrophy)    Chronic kidney disease    GERD (gastroesophageal reflux disease)    Hypertension    on medicines     Past Surgical History:  Procedure Laterality Date   COLONOSCOPY  2006   HAND SURGERY     INGUINAL HERNIA REPAIR  1965   PACEMAKER IMPLANT N/A 09/16/2019   Procedure: PACEMAKER IMPLANT;  Surgeon: Cindie Ole DASEN, MD;  [facility];  Service: Cardiovascular;  Laterality: N/A;   PROSTATE BIOPSY  2011   negative   RETINAL DETACHMENT SURGERY  1985   left, right 05/07   ROTATOR CUFF REPAIR  10/09   right   TONSILLECTOMY  1972    Family History  Problem Relation Age of Onset   Cancer Father        prostate   Cancer  Paternal Uncle        prostate cancer   Colon cancer Neg Hx    Rectal cancer Neg Hx    Stomach cancer Neg Hx     Social History   Socioeconomic History   Marital status: Married    Spouse name: Not on file   Number of children: 3   Years of education: Not on file   Highest education level: Not on file  Occupational History   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    Employer: [employer]   Occupation: Quality assurance--cleaning    Employer: [employer]    Comment:     Occupation: Custodial/funeral assistance    Comment: [employer]  Tobacco Use   Smoking status: Never    Passive exposure: Past   Smokeless tobacco: Never  Vaping Use   Vaping status: Never Used  Substance and Sexual Activity   Alcohol use: No   Drug use: No   Sexual activity: Not on file   Other Topics Concern   Not on file  Social History Narrative   Has living will   Wife has [place] health care POA and alternate is Montel Dales (cousin)   Would accept attempts at resuscitation but no prolonged artificial ventilation   Not prolonged feeding tube   Social Drivers of Health   Financial Resource Strain: Low Risk  (06/16/2023)   Overall Financial Resource Strain (CARDIA)    Difficulty of Paying Living Expenses: Not hard at all  Food Insecurity: No Food Insecurity (06/16/2023)   Hunger Vital Sign    Worried About Running Out of Food in the Last Year: Never true    Ran Out of Food in the Last Year: Never true  Transportation Needs: No Transportation Needs (06/16/2023)   PRAPARE - Administrator, Civil Service (Medical): No    Lack of Transportation (Non-Medical): No  Physical Activity: Sufficiently Active (06/16/2023)   Exercise Vital Sign    Days of Exercise per Week: 4 days    Minutes of Exercise per Session: 40 min  Stress: No Stress Concern Present (06/16/2023)   [employer] of Occupational Health - Occupational Stress Questionnaire    Feeling of Stress : Not at all  Social Connections: Moderately Isolated (06/16/2023)   Social Connection and Isolation Panel    Frequency of Communication with Friends and Family: Once a week    Frequency of Social Gatherings with Friends and Family: Never    Attends Religious Services: More than 4 times per year    Active Member of [employer] or Organizations: No    Attends Banker Meetings: Never    Marital Status: Married  Catering manager Violence: Not At Risk (06/16/2023)   Humiliation, Afraid, Rape, and Kick questionnaire    Fear of Current or Ex-Partner: No    Emotionally Abused: No    Physically Abused: No    Sexually Abused: No   Review of Systems  Constitutional:  Positive for fatigue. Negative for unexpected weight change.       Wears seat belt  HENT:  Negative for dental problem and trouble swallowing.         Hearing aides help Keeps up with dentist  Eyes:  Positive for visual disturbance.  Respiratory:  Positive for cough. Negative for chest tightness and shortness of breath.   Cardiovascular:  Negative for leg swelling.       Occ upper chest soreness No recent palpitations   Gastrointestinal:  Negative for blood in stool and constipation.       No heartburn but belches  Endocrine: Negative for polydipsia and polyuria.  Genitourinary:  Positive for frequency. Negative for difficulty urinating.       Urgency is better on trospium   Musculoskeletal:  Positive for back pain. Negative for arthralgias and joint swelling.  Skin:  Negative for rash.       Does need derm follow up---lesions on head  Allergic/Immunologic: Negative for environmental allergies and immunocompromised state.  Neurological:  Negative for dizziness, syncope, light-headedness and headaches.  Hematological:  Negative for adenopathy. Bruises/bleeds easily.  Psychiatric/Behavioral:  Negative for dysphoric mood. The patient is not nervous/anxious.        Sleep disturbed by nocturia--but does okay overall       Objective:   Physical Exam Constitutional:      Appearance: Normal appearance.  HENT:     Mouth/Throat:     Pharynx: No oropharyngeal exudate or posterior oropharyngeal erythema.  Eyes:     Conjunctiva/sclera: Conjunctivae normal.     Pupils: Pupils are equal, round, and reactive to light.     Comments: Eccentric left pupil Right exotropia  Cardiovascular:     Rate and Rhythm: Normal rate and regular rhythm.     Pulses: Normal pulses.     Heart sounds: No murmur heard.    No gallop.  Pulmonary:     Effort: Pulmonary effort is normal.     Breath sounds: Normal breath sounds. No wheezing or rales.  Abdominal:     Palpations: Abdomen is soft.     Tenderness: There is no abdominal tenderness.  Musculoskeletal:     Cervical back: Neck supple.     Right lower leg: No edema.     Left lower leg: No  edema.  Lymphadenopathy:     Cervical: No cervical adenopathy.  Skin:    Findings: No lesion or rash.  Neurological:     General: No focal deficit present.     Mental Status: He is alert and oriented to person, place, and time.  Psychiatric:        Mood and Affect: Mood normal.        Behavior: Behavior normal.            Assessment & Plan:

## 2023-08-18 NOTE — Assessment & Plan Note (Signed)
Controlled with pantoprazole 

## 2023-08-18 NOTE — Assessment & Plan Note (Signed)
Will be starting radiation soon

## 2023-08-18 NOTE — Assessment & Plan Note (Signed)
 Struggling with back now--new cancer diagnosis Discussed vaccines--flu/COVID/RSV Never had the colonoscopy--will try cologuard Not working--needs to exercise

## 2023-08-18 NOTE — Assessment & Plan Note (Signed)
 Controlled on amlodipine  5, metoprolol  50 bid

## 2023-08-18 NOTE — Assessment & Plan Note (Signed)
 Stable GFR If worsens, would start ARB

## 2023-08-25 ENCOUNTER — Telehealth: Payer: Self-pay | Admitting: Internal Medicine

## 2023-08-25 DIAGNOSIS — C61 Malignant neoplasm of prostate: Secondary | ICD-10-CM | POA: Diagnosis not present

## 2023-08-25 DIAGNOSIS — Z191 Hormone sensitive malignancy status: Secondary | ICD-10-CM | POA: Diagnosis not present

## 2023-08-25 NOTE — Telephone Encounter (Signed)
 Okay; thanks.

## 2023-08-25 NOTE — Telephone Encounter (Signed)
 Please call patient and schedule him to be seen next year for CPE. Thank you

## 2023-08-25 NOTE — Telephone Encounter (Signed)
 Lvmtcb

## 2023-08-25 NOTE — Telephone Encounter (Signed)
 Pt requested to transfer her care to Dr. Cleatus since Dr. Marval retiring. Per Dr. Jimmy, pt needs a cpe next year. Call back # [phone]

## 2023-08-26 NOTE — Progress Notes (Signed)
 Lvm for patient to return call to schedule transfer of care with Dr. Cleatus

## 2023-08-28 DIAGNOSIS — D044 Carcinoma in situ of skin of scalp and neck: Secondary | ICD-10-CM | POA: Diagnosis not present

## 2023-08-28 DIAGNOSIS — D485 Neoplasm of uncertain behavior of skin: Secondary | ICD-10-CM | POA: Diagnosis not present

## 2023-08-31 DIAGNOSIS — Z1211 Encounter for screening for malignant neoplasm of colon: Secondary | ICD-10-CM | POA: Diagnosis not present

## 2023-09-01 ENCOUNTER — Telehealth: Payer: Self-pay

## 2023-09-01 NOTE — Telephone Encounter (Signed)
 Auth Submission: NO AUTH NEEDED Site of care: Urology Payer: [employer] medicare Medication & CPT/J Code(s) submitted: Eligard  [id number] Diagnosis Code:  Route of submission (phone, fax, portal): phone Phone # [phone] Fax # Auth type: Buy/Bill PB Units/visits requested: 45mg  every 6 months Reference number: [id number] Approval from: 09/01/23 to 01/13/24

## 2023-09-02 ENCOUNTER — Encounter: Payer: Self-pay | Admitting: Urology

## 2023-09-02 ENCOUNTER — Ambulatory Visit: Admitting: Urology

## 2023-09-02 VITALS — BP 135/88 | HR 81 | Ht 63.0 in | Wt 182.6 lb

## 2023-09-02 DIAGNOSIS — R972 Elevated prostate specific antigen [PSA]: Secondary | ICD-10-CM

## 2023-09-02 DIAGNOSIS — C61 Malignant neoplasm of prostate: Secondary | ICD-10-CM | POA: Diagnosis not present

## 2023-09-02 MED ORDER — LEUPROLIDE ACETATE (6 MONTH) 45 MG ~~LOC~~ KIT
45.0000 mg | PACK | Freq: Once | SUBCUTANEOUS | Status: AC
  Administered 2023-09-02: 45 mg via SUBCUTANEOUS

## 2023-09-02 MED ORDER — GENTAMICIN SULFATE 40 MG/ML IJ SOLN
80.0000 mg | Freq: Once | INTRAMUSCULAR | Status: AC
  Administered 2023-09-02: 80 mg via INTRAMUSCULAR

## 2023-09-02 MED ORDER — LEVOFLOXACIN 500 MG PO TABS
500.0000 mg | ORAL_TABLET | Freq: Once | ORAL | Status: AC
  Administered 2023-09-02: 500 mg via ORAL

## 2023-09-02 NOTE — Progress Notes (Signed)
 Eligard  SubQ Injection   Due to Prostate Cancer patient is present today for a Eligard  Injection.  Medication: Eligard  6 month Dose: 45 mg  Location: right  Lot: [id number] Exp: 09/2024  Patient tolerated well, no complications were noted  Performed by: Laymon Debby LATHER  Per Dr. Twylla patient is to continue therapy for 2 year . Patient's next follow up was scheduled for 02/26/2024. This appointment was scheduled using wheel and given to patient today along with reminder continue on Vitamin D  800-1000iu and Calcium 1000-1200mg  daily while on Androgen Deprivation Therapy.  PA approval dates:

## 2023-09-02 NOTE — Progress Notes (Signed)
   09/02/23  CC: gold fiducial marker placement  HPI: 78 y.o. male with prostate cancer who presents today for placement of fiducial markers in anticipation of his upcoming IMRT with Dr. Lenn.  Prostate Gold fiducial Marker Placement Procedure   Informed consent was obtained after discussing risks/benefits of the procedure.  A time out was performed to ensure correct patient identity.  Pre-Procedure: - Gentamicin  given prophylactically - PO Levaquin  500 mg also given today  Procedure: - Rectal ultrasound probe was placed without difficulty and the prostate visualized - Prostatic block performed with 10 mL 1% Xylocaine  - 3 fiducial gold seed markers placed, one at right base, one at left base, one at apex of prostate gland under transrectal ultrasound guidance  Post-Procedure: - Patient tolerated the procedure well - He was counseled to seek immediate medical attention if experiences any severe pain, significant bleeding, or fevers - Radiation oncology recommended ADT x 2 years.  We discussed the rationale of androgen deprivation therapy and the most common side effects of hot flashes, tiredness, fatigue, decreased libido, erectile dysfunction, osteoporosis    Glendia Barba, MD

## 2023-09-02 NOTE — Progress Notes (Signed)
 Pt scheduled for toc in 2026 with Dr Cleatus

## 2023-09-03 ENCOUNTER — Encounter: Payer: Self-pay | Admitting: Cardiology

## 2023-09-03 NOTE — Progress Notes (Signed)
 TO BE COMPLETED BY RADIATION ONCOLOGIST OFFICE:   Patient Name: Bobby Woods   Date of Birth: 1945/11/28   Radiation Oncologist: Dr. Marcey Penton  Site to be Treated: Prostate - GOLD SEED MARKERS PLACED   Will the radiation be >10 cm from the device? Yes  Planned Treatment Start Date: 09/21/2023  TO BE COMPLETED BY CARDIOLOGIST OFFICE:   Device Information:  Pacemaker [x]      ICD []    Brand: Medtronic: [phone] Model #: Medtronic [id number] [address] MRI  Serial Number: [id number]     Date of Placement: 09/16/2019  Site of Placement: Chest  Remote Device Check--Frequency: Ever 91 days   Last Check: 06/24/2023  Is the Patient Pacer Dependent?:  Yes [x]   No []   Does cardiologist request Radiation Oncology to schedule device testing by vendor for the following:  Prior to the Initiation of Treatments?  Yes []  No [x]  During Treatments?  Yes []  No [x]  Post Radiation Treatments?  Yes []  No [x]   Is device monitoring necessary by vendor/cardiologist team during treatments?  Yes []   No [x]   Is cardiac monitoring by Radiation Oncology nursing necessary during treatments? Yes []   No [x]   Do you recommend device be relocated prior to Radiation Treatment? Yes []   No [x]   **PLEASE LIST ANY NOTES OR SPECIAL REQUESTS:       CARDIOLOGIST SIGNATURE:  Dr. Ole Holts Per Device Clinic Standing Orders, GARMON DEHN  09/03/2023 4:03 PM  **Please route completed form back to Radiation Oncology Nursing and P [facility] RAD ONC ADMIN, OR send an update if there will be a delay in having form completed by expected start date.  **Call [phone] if you have any questions or do not get an in-basket response from a Radiation Oncology staff member

## 2023-09-05 LAB — COLOGUARD: COLOGUARD: NEGATIVE

## 2023-09-07 ENCOUNTER — Ambulatory Visit
Admission: RE | Admit: 2023-09-07 | Discharge: 2023-09-07 | Disposition: A | Discharge status: Home / Self Care | Source: Ambulatory Visit | Attending: Radiation Oncology | Admitting: Radiation Oncology

## 2023-09-07 ENCOUNTER — Ambulatory Visit: Payer: Self-pay | Admitting: Internal Medicine

## 2023-09-07 ENCOUNTER — Encounter: Payer: Self-pay | Admitting: *Deleted

## 2023-09-07 DIAGNOSIS — Z191 Hormone sensitive malignancy status: Secondary | ICD-10-CM | POA: Diagnosis not present

## 2023-09-07 DIAGNOSIS — C61 Malignant neoplasm of prostate: Secondary | ICD-10-CM | POA: Diagnosis not present

## 2023-09-09 DIAGNOSIS — H541141 Blindness right eye category 4, low vision left eye category 1: Secondary | ICD-10-CM | POA: Diagnosis not present

## 2023-09-09 DIAGNOSIS — H35103 Retinopathy of prematurity, unspecified, bilateral: Secondary | ICD-10-CM | POA: Diagnosis not present

## 2023-09-09 DIAGNOSIS — H4422 Degenerative myopia, left eye: Secondary | ICD-10-CM | POA: Diagnosis not present

## 2023-09-10 DIAGNOSIS — M25471 Effusion, right ankle: Secondary | ICD-10-CM | POA: Diagnosis not present

## 2023-09-10 DIAGNOSIS — M545 Low back pain, unspecified: Secondary | ICD-10-CM | POA: Diagnosis not present

## 2023-09-10 DIAGNOSIS — Z191 Hormone sensitive malignancy status: Secondary | ICD-10-CM | POA: Diagnosis not present

## 2023-09-10 DIAGNOSIS — C61 Malignant neoplasm of prostate: Secondary | ICD-10-CM | POA: Diagnosis not present

## 2023-09-11 ENCOUNTER — Ambulatory Visit (INDEPENDENT_AMBULATORY_CARE_PROVIDER_SITE_OTHER): Payer: Medicare PPO

## 2023-09-11 DIAGNOSIS — I495 Sick sinus syndrome: Secondary | ICD-10-CM

## 2023-09-12 ENCOUNTER — Ambulatory Visit: Payer: Self-pay | Admitting: Cardiology

## 2023-09-12 LAB — CUP PACEART REMOTE DEVICE CHECK
Battery Remaining Longevity: 110 mo
Battery Voltage: 3.01 V
Brady Statistic AP VP Percent: 0.11 %
Brady Statistic AP VS Percent: 98.17 %
Brady Statistic AS VP Percent: 0 %
Brady Statistic AS VS Percent: 1.72 %
Brady Statistic RA Percent Paced: 98.27 %
Brady Statistic RV Percent Paced: 0.11 %
Date Time Interrogation Session: 20250829004522
Implantable Lead Connection Status: 753985
Implantable Lead Connection Status: 753985
Implantable Lead Implant Date: 20210903
Implantable Lead Implant Date: 20210903
Implantable Lead Location: 753859
Implantable Lead Location: 753860
Implantable Lead Model: 5076
Implantable Lead Model: 5076
Implantable Pulse Generator Implant Date: 20210903
Lead Channel Impedance Value: 285 Ohm
Lead Channel Impedance Value: 323 Ohm
Lead Channel Impedance Value: 342 Ohm
Lead Channel Impedance Value: 380 Ohm
Lead Channel Pacing Threshold Amplitude: 0.625 V
Lead Channel Pacing Threshold Amplitude: 0.625 V
Lead Channel Pacing Threshold Pulse Width: 0.4 ms
Lead Channel Pacing Threshold Pulse Width: 0.4 ms
Lead Channel Sensing Intrinsic Amplitude: 2.25 mV
Lead Channel Sensing Intrinsic Amplitude: 2.25 mV
Lead Channel Sensing Intrinsic Amplitude: 7.125 mV
Lead Channel Sensing Intrinsic Amplitude: 7.125 mV
Lead Channel Setting Pacing Amplitude: 1.5 V
Lead Channel Setting Pacing Amplitude: 2 V
Lead Channel Setting Pacing Pulse Width: 0.4 ms
Lead Channel Setting Sensing Sensitivity: 1.2 mV
Zone Setting Status: 755011
Zone Setting Status: 755011

## 2023-09-15 ENCOUNTER — Other Ambulatory Visit: Payer: Self-pay | Admitting: Internal Medicine

## 2023-09-17 ENCOUNTER — Ambulatory Visit
Admission: RE | Admit: 2023-09-17 | Discharge: 2023-09-17 | Disposition: A | Discharge status: Home / Self Care | Source: Ambulatory Visit | Attending: Radiation Oncology | Admitting: Radiation Oncology

## 2023-09-17 DIAGNOSIS — C61 Malignant neoplasm of prostate: Secondary | ICD-10-CM | POA: Insufficient documentation

## 2023-09-19 ENCOUNTER — Other Ambulatory Visit: Payer: Self-pay | Admitting: Cardiology

## 2023-09-21 ENCOUNTER — Other Ambulatory Visit: Payer: Self-pay

## 2023-09-21 ENCOUNTER — Ambulatory Visit
Admission: RE | Admit: 2023-09-21 | Discharge: 2023-09-21 | Disposition: A | Discharge status: Home / Self Care | Source: Ambulatory Visit | Attending: Radiation Oncology | Admitting: Radiation Oncology

## 2023-09-21 ENCOUNTER — Telehealth: Payer: Self-pay | Admitting: Cardiology

## 2023-09-21 DIAGNOSIS — Z191 Hormone sensitive malignancy status: Secondary | ICD-10-CM | POA: Diagnosis not present

## 2023-09-21 DIAGNOSIS — Z510 Encounter for antineoplastic radiation therapy: Secondary | ICD-10-CM | POA: Diagnosis not present

## 2023-09-21 DIAGNOSIS — C61 Malignant neoplasm of prostate: Secondary | ICD-10-CM | POA: Diagnosis not present

## 2023-09-21 LAB — RAD ONC ARIA SESSION SUMMARY
Course Elapsed Days: 0
Plan Fractions Treated to Date: 1
Plan Prescribed Dose Per Fraction: 2.5 Gy
Plan Total Fractions Prescribed: 28
Plan Total Prescribed Dose: 70 Gy
Reference Point Dosage Given to Date: 2.5 Gy
Reference Point Session Dosage Given: 2.5 Gy
Session Number: 1

## 2023-09-21 MED ORDER — METOPROLOL SUCCINATE ER 50 MG PO TB24: 50.0000 mg | ORAL_TABLET | Freq: Two times a day (BID) | ORAL | 2 refills | Status: AC

## 2023-09-21 NOTE — Progress Notes (Signed)
 Remote PPM Transmission

## 2023-09-21 NOTE — Telephone Encounter (Signed)
Pt's medication were sent to pt's pharmacy as requested. Confirmation received.  

## 2023-09-21 NOTE — Telephone Encounter (Signed)
*  STAT* If patient is at the pharmacy, call can be transferred to refill team.   1. Which medications need to be refilled? (please list name of each medication and dose if known) Metoprolol    2. Would you like to learn more about the convenience, safety, & potential cost savings by using the [facility]?    3. Are you open to using the [facility] (Type [facility]. ).   4. Which pharmacy/location (including street and city if local pharmacy) is medication to be sent to?[facility] [place]   5. Do they need a 30 day or 90 day supply? 90

## 2023-09-22 ENCOUNTER — Other Ambulatory Visit: Payer: Self-pay

## 2023-09-22 ENCOUNTER — Ambulatory Visit
Admission: RE | Admit: 2023-09-22 | Discharge: 2023-09-22 | Disposition: A | Discharge status: Home / Self Care | Source: Ambulatory Visit | Attending: Radiation Oncology | Admitting: Radiation Oncology

## 2023-09-22 DIAGNOSIS — C61 Malignant neoplasm of prostate: Secondary | ICD-10-CM | POA: Diagnosis not present

## 2023-09-22 LAB — RAD ONC ARIA SESSION SUMMARY
Course Elapsed Days: 1
Plan Fractions Treated to Date: 2
Plan Prescribed Dose Per Fraction: 2.5 Gy
Plan Total Fractions Prescribed: 28
Plan Total Prescribed Dose: 70 Gy
Reference Point Dosage Given to Date: 5 Gy
Reference Point Session Dosage Given: 2.5 Gy
Session Number: 2

## 2023-09-23 ENCOUNTER — Other Ambulatory Visit: Payer: Self-pay

## 2023-09-23 ENCOUNTER — Ambulatory Visit
Admission: RE | Admit: 2023-09-23 | Discharge: 2023-09-23 | Disposition: A | Discharge status: Home / Self Care | Source: Ambulatory Visit | Attending: Radiation Oncology | Admitting: Radiation Oncology

## 2023-09-23 DIAGNOSIS — Z191 Hormone sensitive malignancy status: Secondary | ICD-10-CM | POA: Diagnosis not present

## 2023-09-23 DIAGNOSIS — H6983 Other specified disorders of Eustachian tube, bilateral: Secondary | ICD-10-CM | POA: Diagnosis not present

## 2023-09-23 DIAGNOSIS — C61 Malignant neoplasm of prostate: Secondary | ICD-10-CM | POA: Diagnosis not present

## 2023-09-23 DIAGNOSIS — H93293 Other abnormal auditory perceptions, bilateral: Secondary | ICD-10-CM | POA: Diagnosis not present

## 2023-09-23 DIAGNOSIS — D044 Carcinoma in situ of skin of scalp and neck: Secondary | ICD-10-CM | POA: Diagnosis not present

## 2023-09-23 LAB — RAD ONC ARIA SESSION SUMMARY
Course Elapsed Days: 2
Plan Fractions Treated to Date: 3
Plan Prescribed Dose Per Fraction: 2.5 Gy
Plan Total Fractions Prescribed: 28
Plan Total Prescribed Dose: 70 Gy
Reference Point Dosage Given to Date: 7.5 Gy
Reference Point Session Dosage Given: 2.5 Gy
Session Number: 3

## 2023-09-24 ENCOUNTER — Other Ambulatory Visit: Payer: Self-pay | Admitting: *Deleted

## 2023-09-24 ENCOUNTER — Inpatient Hospital Stay: Attending: Radiation Oncology

## 2023-09-24 ENCOUNTER — Other Ambulatory Visit: Payer: Self-pay

## 2023-09-24 ENCOUNTER — Ambulatory Visit
Admission: RE | Admit: 2023-09-24 | Discharge: 2023-09-24 | Disposition: A | Discharge status: Home / Self Care | Source: Ambulatory Visit | Attending: Radiation Oncology | Admitting: Radiation Oncology

## 2023-09-24 DIAGNOSIS — C61 Malignant neoplasm of prostate: Secondary | ICD-10-CM | POA: Diagnosis not present

## 2023-09-24 DIAGNOSIS — Z191 Hormone sensitive malignancy status: Secondary | ICD-10-CM | POA: Diagnosis not present

## 2023-09-24 LAB — CBC (CANCER CENTER ONLY)
HCT: 41.8 % (ref 39.0–52.0)
Hemoglobin: 14.6 g/dL (ref 13.0–17.0)
MCH: 31.5 pg (ref 26.0–34.0)
MCHC: 34.9 g/dL (ref 30.0–36.0)
MCV: 90.1 fL (ref 80.0–100.0)
Platelet Count: 170 K/uL (ref 150–400)
RBC: 4.64 MIL/uL (ref 4.22–5.81)
RDW: 12.4 % (ref 11.5–15.5)
WBC Count: 7.6 K/uL (ref 4.0–10.5)
nRBC: 0 % (ref 0.0–0.2)

## 2023-09-24 LAB — RAD ONC ARIA SESSION SUMMARY
Course Elapsed Days: 3
Plan Fractions Treated to Date: 4
Plan Prescribed Dose Per Fraction: 2.5 Gy
Plan Total Fractions Prescribed: 28
Plan Total Prescribed Dose: 70 Gy
Reference Point Dosage Given to Date: 10 Gy
Reference Point Session Dosage Given: 2.5 Gy
Session Number: 4

## 2023-09-25 ENCOUNTER — Ambulatory Visit
Admission: RE | Admit: 2023-09-25 | Discharge: 2023-09-25 | Disposition: A | Discharge status: Home / Self Care | Source: Ambulatory Visit | Attending: Radiation Oncology | Admitting: Radiation Oncology

## 2023-09-25 ENCOUNTER — Other Ambulatory Visit: Payer: Self-pay

## 2023-09-25 DIAGNOSIS — Z191 Hormone sensitive malignancy status: Secondary | ICD-10-CM | POA: Diagnosis not present

## 2023-09-25 DIAGNOSIS — C61 Malignant neoplasm of prostate: Secondary | ICD-10-CM | POA: Diagnosis not present

## 2023-09-25 LAB — RAD ONC ARIA SESSION SUMMARY
Course Elapsed Days: 4
Plan Fractions Treated to Date: 5
Plan Prescribed Dose Per Fraction: 2.5 Gy
Plan Total Fractions Prescribed: 28
Plan Total Prescribed Dose: 70 Gy
Reference Point Dosage Given to Date: 12.5 Gy
Reference Point Session Dosage Given: 2.5 Gy
Session Number: 5

## 2023-09-28 ENCOUNTER — Other Ambulatory Visit: Payer: Self-pay

## 2023-09-28 ENCOUNTER — Ambulatory Visit
Admission: RE | Admit: 2023-09-28 | Discharge: 2023-09-28 | Disposition: A | Discharge status: Home / Self Care | Source: Ambulatory Visit | Attending: Radiation Oncology | Admitting: Radiation Oncology

## 2023-09-28 DIAGNOSIS — Z510 Encounter for antineoplastic radiation therapy: Secondary | ICD-10-CM | POA: Diagnosis not present

## 2023-09-28 DIAGNOSIS — Z191 Hormone sensitive malignancy status: Secondary | ICD-10-CM | POA: Diagnosis not present

## 2023-09-28 DIAGNOSIS — C61 Malignant neoplasm of prostate: Secondary | ICD-10-CM | POA: Diagnosis not present

## 2023-09-28 LAB — RAD ONC ARIA SESSION SUMMARY
Course Elapsed Days: 7
Plan Fractions Treated to Date: 6
Plan Prescribed Dose Per Fraction: 2.5 Gy
Plan Total Fractions Prescribed: 28
Plan Total Prescribed Dose: 70 Gy
Reference Point Dosage Given to Date: 15 Gy
Reference Point Session Dosage Given: 2.5 Gy
Session Number: 6

## 2023-09-29 ENCOUNTER — Other Ambulatory Visit: Payer: Self-pay

## 2023-09-29 ENCOUNTER — Ambulatory Visit
Admission: RE | Admit: 2023-09-29 | Discharge: 2023-09-29 | Disposition: A | Discharge status: Home / Self Care | Source: Ambulatory Visit | Attending: Radiation Oncology | Admitting: Radiation Oncology

## 2023-09-29 DIAGNOSIS — C61 Malignant neoplasm of prostate: Secondary | ICD-10-CM | POA: Diagnosis not present

## 2023-09-29 LAB — RAD ONC ARIA SESSION SUMMARY
Course Elapsed Days: 8
Plan Fractions Treated to Date: 7
Plan Prescribed Dose Per Fraction: 2.5 Gy
Plan Total Fractions Prescribed: 28
Plan Total Prescribed Dose: 70 Gy
Reference Point Dosage Given to Date: 17.5 Gy
Reference Point Session Dosage Given: 2.5 Gy
Session Number: 7

## 2023-09-30 ENCOUNTER — Ambulatory Visit
Admission: RE | Admit: 2023-09-30 | Discharge: 2023-09-30 | Disposition: A | Discharge status: Home / Self Care | Source: Ambulatory Visit | Attending: Radiation Oncology | Admitting: Radiation Oncology

## 2023-09-30 ENCOUNTER — Other Ambulatory Visit: Payer: Self-pay

## 2023-09-30 DIAGNOSIS — C61 Malignant neoplasm of prostate: Secondary | ICD-10-CM | POA: Diagnosis not present

## 2023-09-30 LAB — RAD ONC ARIA SESSION SUMMARY
Course Elapsed Days: 9
Plan Fractions Treated to Date: 8
Plan Prescribed Dose Per Fraction: 2.5 Gy
Plan Total Fractions Prescribed: 28
Plan Total Prescribed Dose: 70 Gy
Reference Point Dosage Given to Date: 20 Gy
Reference Point Session Dosage Given: 2.5 Gy
Session Number: 8

## 2023-10-01 ENCOUNTER — Ambulatory Visit
Admission: RE | Admit: 2023-10-01 | Discharge: 2023-10-01 | Disposition: A | Discharge status: Home / Self Care | Source: Ambulatory Visit | Attending: Radiation Oncology | Admitting: Radiation Oncology

## 2023-10-01 ENCOUNTER — Other Ambulatory Visit: Payer: Self-pay

## 2023-10-01 DIAGNOSIS — C61 Malignant neoplasm of prostate: Secondary | ICD-10-CM | POA: Diagnosis not present

## 2023-10-01 DIAGNOSIS — Z191 Hormone sensitive malignancy status: Secondary | ICD-10-CM | POA: Diagnosis not present

## 2023-10-01 LAB — RAD ONC ARIA SESSION SUMMARY
Course Elapsed Days: 10
Plan Fractions Treated to Date: 9
Plan Prescribed Dose Per Fraction: 2.5 Gy
Plan Total Fractions Prescribed: 28
Plan Total Prescribed Dose: 70 Gy
Reference Point Dosage Given to Date: 22.5 Gy
Reference Point Session Dosage Given: 2.5 Gy
Session Number: 9

## 2023-10-02 ENCOUNTER — Ambulatory Visit

## 2023-10-02 ENCOUNTER — Other Ambulatory Visit: Payer: Self-pay

## 2023-10-02 MED ORDER — CLOPIDOGREL BISULFATE 75 MG PO TABS: 75.0000 mg | ORAL_TABLET | Freq: Every day | ORAL | 3 refills | Status: DC

## 2023-10-02 NOTE — Telephone Encounter (Signed)
 Rx sent electronically.

## 2023-10-05 ENCOUNTER — Ambulatory Visit
Admission: RE | Admit: 2023-10-05 | Discharge: 2023-10-05 | Disposition: A | Discharge status: Home / Self Care | Source: Ambulatory Visit | Attending: Radiation Oncology | Admitting: Radiation Oncology

## 2023-10-05 ENCOUNTER — Other Ambulatory Visit: Payer: Self-pay

## 2023-10-05 DIAGNOSIS — C61 Malignant neoplasm of prostate: Secondary | ICD-10-CM | POA: Diagnosis not present

## 2023-10-05 LAB — RAD ONC ARIA SESSION SUMMARY
Course Elapsed Days: 14
Plan Fractions Treated to Date: 10
Plan Prescribed Dose Per Fraction: 2.5 Gy
Plan Total Fractions Prescribed: 28
Plan Total Prescribed Dose: 70 Gy
Reference Point Dosage Given to Date: 25 Gy
Reference Point Session Dosage Given: 2.5 Gy
Session Number: 10

## 2023-10-06 ENCOUNTER — Ambulatory Visit
Admission: RE | Admit: 2023-10-06 | Discharge: 2023-10-06 | Disposition: A | Discharge status: Home / Self Care | Source: Ambulatory Visit | Attending: Radiation Oncology | Admitting: Radiation Oncology

## 2023-10-06 ENCOUNTER — Other Ambulatory Visit: Payer: Self-pay

## 2023-10-06 DIAGNOSIS — Z191 Hormone sensitive malignancy status: Secondary | ICD-10-CM | POA: Diagnosis not present

## 2023-10-06 DIAGNOSIS — C61 Malignant neoplasm of prostate: Secondary | ICD-10-CM | POA: Diagnosis not present

## 2023-10-06 LAB — RAD ONC ARIA SESSION SUMMARY
Course Elapsed Days: 15
Plan Fractions Treated to Date: 11
Plan Prescribed Dose Per Fraction: 2.5 Gy
Plan Total Fractions Prescribed: 28
Plan Total Prescribed Dose: 70 Gy
Reference Point Dosage Given to Date: 27.5 Gy
Reference Point Session Dosage Given: 2.5 Gy
Session Number: 11

## 2023-10-07 ENCOUNTER — Ambulatory Visit
Admission: RE | Admit: 2023-10-07 | Discharge: 2023-10-07 | Discharge status: Home / Self Care | Source: Ambulatory Visit | Attending: Radiation Oncology | Admitting: Radiation Oncology

## 2023-10-07 ENCOUNTER — Other Ambulatory Visit: Payer: Self-pay

## 2023-10-07 DIAGNOSIS — C61 Malignant neoplasm of prostate: Secondary | ICD-10-CM | POA: Diagnosis not present

## 2023-10-07 LAB — RAD ONC ARIA SESSION SUMMARY
Course Elapsed Days: 16
Plan Fractions Treated to Date: 12
Plan Prescribed Dose Per Fraction: 2.5 Gy
Plan Total Fractions Prescribed: 28
Plan Total Prescribed Dose: 70 Gy
Reference Point Dosage Given to Date: 30 Gy
Reference Point Session Dosage Given: 2.5 Gy
Session Number: 12

## 2023-10-08 ENCOUNTER — Inpatient Hospital Stay

## 2023-10-08 ENCOUNTER — Ambulatory Visit
Admission: RE | Admit: 2023-10-08 | Discharge: 2023-10-08 | Disposition: A | Discharge status: Home / Self Care | Source: Ambulatory Visit | Attending: Radiation Oncology | Admitting: Radiation Oncology

## 2023-10-08 ENCOUNTER — Other Ambulatory Visit: Payer: Self-pay

## 2023-10-08 DIAGNOSIS — Z191 Hormone sensitive malignancy status: Secondary | ICD-10-CM | POA: Diagnosis not present

## 2023-10-08 DIAGNOSIS — C61 Malignant neoplasm of prostate: Secondary | ICD-10-CM

## 2023-10-08 LAB — RAD ONC ARIA SESSION SUMMARY
Course Elapsed Days: 17
Plan Fractions Treated to Date: 13
Plan Prescribed Dose Per Fraction: 2.5 Gy
Plan Total Fractions Prescribed: 28
Plan Total Prescribed Dose: 70 Gy
Reference Point Dosage Given to Date: 32.5 Gy
Reference Point Session Dosage Given: 2.5 Gy
Session Number: 13

## 2023-10-08 LAB — CBC (CANCER CENTER ONLY)
HCT: 42.4 % (ref 39.0–52.0)
Hemoglobin: 14.6 g/dL (ref 13.0–17.0)
MCH: 31.5 pg (ref 26.0–34.0)
MCHC: 34.4 g/dL (ref 30.0–36.0)
MCV: 91.4 fL (ref 80.0–100.0)
Platelet Count: 126 K/uL — ABNORMAL LOW (ref 150–400)
RBC: 4.64 MIL/uL (ref 4.22–5.81)
RDW: 12.6 % (ref 11.5–15.5)
WBC Count: 8.4 K/uL (ref 4.0–10.5)
nRBC: 0 % (ref 0.0–0.2)

## 2023-10-09 ENCOUNTER — Ambulatory Visit
Admission: RE | Admit: 2023-10-09 | Discharge: 2023-10-09 | Disposition: A | Discharge status: Home / Self Care | Source: Ambulatory Visit | Attending: Radiation Oncology | Admitting: Radiation Oncology

## 2023-10-09 ENCOUNTER — Other Ambulatory Visit: Payer: Self-pay

## 2023-10-09 DIAGNOSIS — C61 Malignant neoplasm of prostate: Secondary | ICD-10-CM | POA: Diagnosis not present

## 2023-10-09 LAB — RAD ONC ARIA SESSION SUMMARY
Course Elapsed Days: 18
Plan Fractions Treated to Date: 14
Plan Prescribed Dose Per Fraction: 2.5 Gy
Plan Total Fractions Prescribed: 28
Plan Total Prescribed Dose: 70 Gy
Reference Point Dosage Given to Date: 35 Gy
Reference Point Session Dosage Given: 2.5 Gy
Session Number: 14

## 2023-10-12 ENCOUNTER — Ambulatory Visit
Admission: RE | Admit: 2023-10-12 | Discharge: 2023-10-12 | Disposition: A | Discharge status: Home / Self Care | Source: Ambulatory Visit | Attending: Radiation Oncology | Admitting: Radiation Oncology

## 2023-10-12 ENCOUNTER — Other Ambulatory Visit: Payer: Self-pay

## 2023-10-12 DIAGNOSIS — C61 Malignant neoplasm of prostate: Secondary | ICD-10-CM | POA: Diagnosis not present

## 2023-10-12 LAB — RAD ONC ARIA SESSION SUMMARY
Course Elapsed Days: 21
Plan Fractions Treated to Date: 15
Plan Prescribed Dose Per Fraction: 2.5 Gy
Plan Total Fractions Prescribed: 28
Plan Total Prescribed Dose: 70 Gy
Reference Point Dosage Given to Date: 37.5 Gy
Reference Point Session Dosage Given: 2.5 Gy
Session Number: 15

## 2023-10-13 ENCOUNTER — Ambulatory Visit
Admission: RE | Admit: 2023-10-13 | Discharge: 2023-10-13 | Disposition: A | Discharge status: Home / Self Care | Source: Ambulatory Visit | Attending: Radiation Oncology | Admitting: Radiation Oncology

## 2023-10-13 ENCOUNTER — Other Ambulatory Visit: Payer: Self-pay

## 2023-10-13 DIAGNOSIS — C61 Malignant neoplasm of prostate: Secondary | ICD-10-CM | POA: Diagnosis not present

## 2023-10-13 LAB — RAD ONC ARIA SESSION SUMMARY
Course Elapsed Days: 22
Plan Fractions Treated to Date: 16
Plan Prescribed Dose Per Fraction: 2.5 Gy
Plan Total Fractions Prescribed: 28
Plan Total Prescribed Dose: 70 Gy
Reference Point Dosage Given to Date: 40 Gy
Reference Point Session Dosage Given: 2.5 Gy
Session Number: 16

## 2023-10-14 ENCOUNTER — Other Ambulatory Visit: Payer: Self-pay

## 2023-10-14 ENCOUNTER — Ambulatory Visit
Admission: RE | Admit: 2023-10-14 | Discharge: 2023-10-14 | Disposition: A | Discharge status: Home / Self Care | Source: Ambulatory Visit | Attending: Radiation Oncology | Admitting: Radiation Oncology

## 2023-10-14 DIAGNOSIS — C61 Malignant neoplasm of prostate: Secondary | ICD-10-CM | POA: Diagnosis not present

## 2023-10-14 DIAGNOSIS — Z191 Hormone sensitive malignancy status: Secondary | ICD-10-CM | POA: Diagnosis not present

## 2023-10-14 DIAGNOSIS — Z510 Encounter for antineoplastic radiation therapy: Secondary | ICD-10-CM | POA: Diagnosis not present

## 2023-10-14 LAB — RAD ONC ARIA SESSION SUMMARY
Course Elapsed Days: 23
Plan Fractions Treated to Date: 17
Plan Prescribed Dose Per Fraction: 2.5 Gy
Plan Total Fractions Prescribed: 28
Plan Total Prescribed Dose: 70 Gy
Reference Point Dosage Given to Date: 42.5 Gy
Reference Point Session Dosage Given: 2.5 Gy
Session Number: 17

## 2023-10-15 ENCOUNTER — Ambulatory Visit
Admission: RE | Admit: 2023-10-15 | Discharge: 2023-10-15 | Disposition: A | Source: Ambulatory Visit | Attending: Radiation Oncology | Admitting: Radiation Oncology

## 2023-10-15 ENCOUNTER — Other Ambulatory Visit: Payer: Self-pay

## 2023-10-15 DIAGNOSIS — C61 Malignant neoplasm of prostate: Secondary | ICD-10-CM | POA: Diagnosis not present

## 2023-10-15 DIAGNOSIS — Z510 Encounter for antineoplastic radiation therapy: Secondary | ICD-10-CM | POA: Diagnosis not present

## 2023-10-15 DIAGNOSIS — Z191 Hormone sensitive malignancy status: Secondary | ICD-10-CM | POA: Diagnosis not present

## 2023-10-15 LAB — RAD ONC ARIA SESSION SUMMARY
Course Elapsed Days: 24
Plan Fractions Treated to Date: 18
Plan Prescribed Dose Per Fraction: 2.5 Gy
Plan Total Fractions Prescribed: 28
Plan Total Prescribed Dose: 70 Gy
Reference Point Dosage Given to Date: 45 Gy
Reference Point Session Dosage Given: 2.5 Gy
Session Number: 18

## 2023-10-16 ENCOUNTER — Ambulatory Visit
Admission: RE | Admit: 2023-10-16 | Discharge: 2023-10-16 | Disposition: A | Discharge status: Home / Self Care | Source: Ambulatory Visit | Attending: Radiation Oncology | Admitting: Radiation Oncology

## 2023-10-16 ENCOUNTER — Other Ambulatory Visit: Payer: Self-pay

## 2023-10-16 ENCOUNTER — Encounter: Admitting: Cardiology

## 2023-10-16 DIAGNOSIS — C61 Malignant neoplasm of prostate: Secondary | ICD-10-CM | POA: Diagnosis not present

## 2023-10-16 LAB — RAD ONC ARIA SESSION SUMMARY
Course Elapsed Days: 25
Plan Fractions Treated to Date: 19
Plan Prescribed Dose Per Fraction: 2.5 Gy
Plan Total Fractions Prescribed: 28
Plan Total Prescribed Dose: 70 Gy
Reference Point Dosage Given to Date: 47.5 Gy
Reference Point Session Dosage Given: 2.5 Gy
Session Number: 19

## 2023-10-19 ENCOUNTER — Ambulatory Visit
Admission: RE | Admit: 2023-10-19 | Discharge: 2023-10-19 | Disposition: A | Discharge status: Home / Self Care | Source: Ambulatory Visit | Attending: Radiation Oncology | Admitting: Radiation Oncology

## 2023-10-19 ENCOUNTER — Other Ambulatory Visit: Payer: Self-pay

## 2023-10-19 DIAGNOSIS — Z510 Encounter for antineoplastic radiation therapy: Secondary | ICD-10-CM | POA: Diagnosis not present

## 2023-10-19 DIAGNOSIS — Z191 Hormone sensitive malignancy status: Secondary | ICD-10-CM | POA: Diagnosis not present

## 2023-10-19 DIAGNOSIS — C61 Malignant neoplasm of prostate: Secondary | ICD-10-CM | POA: Diagnosis not present

## 2023-10-19 LAB — RAD ONC ARIA SESSION SUMMARY
Course Elapsed Days: 28
Plan Fractions Treated to Date: 20
Plan Prescribed Dose Per Fraction: 2.5 Gy
Plan Total Fractions Prescribed: 28
Plan Total Prescribed Dose: 70 Gy
Reference Point Dosage Given to Date: 50 Gy
Reference Point Session Dosage Given: 2.5 Gy
Session Number: 20

## 2023-10-20 ENCOUNTER — Ambulatory Visit
Admission: RE | Admit: 2023-10-20 | Discharge: 2023-10-20 | Disposition: A | Source: Ambulatory Visit | Attending: Radiation Oncology | Admitting: Radiation Oncology

## 2023-10-20 ENCOUNTER — Other Ambulatory Visit: Payer: Self-pay

## 2023-10-20 DIAGNOSIS — C61 Malignant neoplasm of prostate: Secondary | ICD-10-CM | POA: Diagnosis not present

## 2023-10-20 LAB — RAD ONC ARIA SESSION SUMMARY
Course Elapsed Days: 29
Plan Fractions Treated to Date: 21
Plan Prescribed Dose Per Fraction: 2.5 Gy
Plan Total Fractions Prescribed: 28
Plan Total Prescribed Dose: 70 Gy
Reference Point Dosage Given to Date: 52.5 Gy
Reference Point Session Dosage Given: 2.5 Gy
Session Number: 21

## 2023-10-21 ENCOUNTER — Other Ambulatory Visit: Payer: Self-pay

## 2023-10-21 ENCOUNTER — Ambulatory Visit
Admission: RE | Admit: 2023-10-21 | Discharge: 2023-10-21 | Disposition: A | Discharge status: Home / Self Care | Source: Ambulatory Visit | Attending: Radiation Oncology | Admitting: Radiation Oncology

## 2023-10-21 DIAGNOSIS — Z510 Encounter for antineoplastic radiation therapy: Secondary | ICD-10-CM | POA: Diagnosis not present

## 2023-10-21 DIAGNOSIS — C61 Malignant neoplasm of prostate: Secondary | ICD-10-CM | POA: Diagnosis not present

## 2023-10-21 LAB — RAD ONC ARIA SESSION SUMMARY
Course Elapsed Days: 30
Plan Fractions Treated to Date: 22
Plan Prescribed Dose Per Fraction: 2.5 Gy
Plan Total Fractions Prescribed: 28
Plan Total Prescribed Dose: 70 Gy
Reference Point Dosage Given to Date: 55 Gy
Reference Point Session Dosage Given: 2.5 Gy
Session Number: 22

## 2023-10-22 ENCOUNTER — Ambulatory Visit
Admission: RE | Admit: 2023-10-22 | Discharge: 2023-10-22 | Disposition: A | Discharge status: Home / Self Care | Source: Ambulatory Visit | Attending: Radiation Oncology | Admitting: Radiation Oncology

## 2023-10-22 ENCOUNTER — Other Ambulatory Visit: Payer: Self-pay

## 2023-10-22 ENCOUNTER — Inpatient Hospital Stay

## 2023-10-22 ENCOUNTER — Inpatient Hospital Stay: Attending: Radiation Oncology

## 2023-10-22 DIAGNOSIS — Z79899 Other long term (current) drug therapy: Secondary | ICD-10-CM | POA: Diagnosis not present

## 2023-10-22 DIAGNOSIS — C61 Malignant neoplasm of prostate: Secondary | ICD-10-CM | POA: Insufficient documentation

## 2023-10-22 LAB — RAD ONC ARIA SESSION SUMMARY
Course Elapsed Days: 31
Plan Fractions Treated to Date: 23
Plan Prescribed Dose Per Fraction: 2.5 Gy
Plan Total Fractions Prescribed: 28
Plan Total Prescribed Dose: 70 Gy
Reference Point Dosage Given to Date: 57.5 Gy
Reference Point Session Dosage Given: 2.5 Gy
Session Number: 23

## 2023-10-22 LAB — CBC (CANCER CENTER ONLY)
HCT: 41.8 % (ref 39.0–52.0)
Hemoglobin: 14.5 g/dL (ref 13.0–17.0)
MCH: 31.6 pg (ref 26.0–34.0)
MCHC: 34.7 g/dL (ref 30.0–36.0)
MCV: 91.1 fL (ref 80.0–100.0)
Platelet Count: 143 K/uL — ABNORMAL LOW (ref 150–400)
RBC: 4.59 MIL/uL (ref 4.22–5.81)
RDW: 12.8 % (ref 11.5–15.5)
WBC Count: 5.6 K/uL (ref 4.0–10.5)
nRBC: 0 % (ref 0.0–0.2)

## 2023-10-23 ENCOUNTER — Other Ambulatory Visit: Payer: Self-pay

## 2023-10-23 ENCOUNTER — Ambulatory Visit
Admission: RE | Admit: 2023-10-23 | Discharge: 2023-10-23 | Disposition: A | Discharge status: Home / Self Care | Source: Ambulatory Visit | Attending: Radiation Oncology | Admitting: Radiation Oncology

## 2023-10-23 DIAGNOSIS — C61 Malignant neoplasm of prostate: Secondary | ICD-10-CM | POA: Diagnosis not present

## 2023-10-23 LAB — RAD ONC ARIA SESSION SUMMARY
Course Elapsed Days: 32
Plan Fractions Treated to Date: 24
Plan Prescribed Dose Per Fraction: 2.5 Gy
Plan Total Fractions Prescribed: 28
Plan Total Prescribed Dose: 70 Gy
Reference Point Dosage Given to Date: 60 Gy
Reference Point Session Dosage Given: 2.5 Gy
Session Number: 24

## 2023-10-26 ENCOUNTER — Other Ambulatory Visit: Payer: Self-pay

## 2023-10-26 ENCOUNTER — Ambulatory Visit
Admission: RE | Admit: 2023-10-26 | Discharge: 2023-10-26 | Disposition: A | Discharge status: Home / Self Care | Source: Ambulatory Visit | Attending: Radiation Oncology | Admitting: Radiation Oncology

## 2023-10-26 DIAGNOSIS — Z510 Encounter for antineoplastic radiation therapy: Secondary | ICD-10-CM | POA: Diagnosis not present

## 2023-10-26 DIAGNOSIS — C61 Malignant neoplasm of prostate: Secondary | ICD-10-CM | POA: Diagnosis not present

## 2023-10-26 LAB — RAD ONC ARIA SESSION SUMMARY
Course Elapsed Days: 35
Plan Fractions Treated to Date: 25
Plan Prescribed Dose Per Fraction: 2.5 Gy
Plan Total Fractions Prescribed: 28
Plan Total Prescribed Dose: 70 Gy
Reference Point Dosage Given to Date: 62.5 Gy
Reference Point Session Dosage Given: 2.5 Gy
Session Number: 25

## 2023-10-27 ENCOUNTER — Ambulatory Visit
Admission: RE | Admit: 2023-10-27 | Discharge: 2023-10-27 | Disposition: A | Discharge status: Home / Self Care | Source: Ambulatory Visit | Attending: Radiation Oncology | Admitting: Radiation Oncology

## 2023-10-27 ENCOUNTER — Other Ambulatory Visit: Payer: Self-pay

## 2023-10-27 DIAGNOSIS — C61 Malignant neoplasm of prostate: Secondary | ICD-10-CM | POA: Diagnosis not present

## 2023-10-27 DIAGNOSIS — Z510 Encounter for antineoplastic radiation therapy: Secondary | ICD-10-CM | POA: Diagnosis not present

## 2023-10-27 DIAGNOSIS — Z191 Hormone sensitive malignancy status: Secondary | ICD-10-CM | POA: Diagnosis not present

## 2023-10-27 LAB — RAD ONC ARIA SESSION SUMMARY
Course Elapsed Days: 36
Plan Fractions Treated to Date: 26
Plan Prescribed Dose Per Fraction: 2.5 Gy
Plan Total Fractions Prescribed: 28
Plan Total Prescribed Dose: 70 Gy
Reference Point Dosage Given to Date: 65 Gy
Reference Point Session Dosage Given: 2.5 Gy
Session Number: 26

## 2023-10-28 ENCOUNTER — Ambulatory Visit
Admission: RE | Admit: 2023-10-28 | Discharge: 2023-10-28 | Disposition: A | Discharge status: Home / Self Care | Source: Ambulatory Visit | Attending: Radiation Oncology | Admitting: Radiation Oncology

## 2023-10-28 ENCOUNTER — Other Ambulatory Visit: Payer: Self-pay

## 2023-10-28 DIAGNOSIS — C61 Malignant neoplasm of prostate: Secondary | ICD-10-CM | POA: Diagnosis not present

## 2023-10-28 LAB — RAD ONC ARIA SESSION SUMMARY
Course Elapsed Days: 37
Plan Fractions Treated to Date: 27
Plan Prescribed Dose Per Fraction: 2.5 Gy
Plan Total Fractions Prescribed: 28
Plan Total Prescribed Dose: 70 Gy
Reference Point Dosage Given to Date: 67.5 Gy
Reference Point Session Dosage Given: 2.5 Gy
Session Number: 27

## 2023-10-29 ENCOUNTER — Ambulatory Visit
Admission: RE | Admit: 2023-10-29 | Discharge: 2023-10-29 | Disposition: A | Discharge status: Home / Self Care | Source: Ambulatory Visit | Attending: Radiation Oncology | Admitting: Radiation Oncology

## 2023-10-29 ENCOUNTER — Ambulatory Visit

## 2023-10-29 ENCOUNTER — Other Ambulatory Visit: Payer: Self-pay

## 2023-10-29 DIAGNOSIS — Z191 Hormone sensitive malignancy status: Secondary | ICD-10-CM | POA: Diagnosis not present

## 2023-10-29 DIAGNOSIS — C61 Malignant neoplasm of prostate: Secondary | ICD-10-CM | POA: Diagnosis not present

## 2023-10-29 LAB — RAD ONC ARIA SESSION SUMMARY
Course Elapsed Days: 38
Plan Fractions Treated to Date: 28
Plan Prescribed Dose Per Fraction: 2.5 Gy
Plan Total Fractions Prescribed: 28
Plan Total Prescribed Dose: 70 Gy
Reference Point Dosage Given to Date: 70 Gy
Reference Point Session Dosage Given: 2.5 Gy
Session Number: 28

## 2023-10-30 ENCOUNTER — Ambulatory Visit

## 2023-10-30 NOTE — Radiation Completion Notes (Signed)
 Patient Name: Bobby Woods, Bobby Woods MRN: [id number] Date of Birth: 12-28-1945 Referring Physician: GLENDIA BARBA, M.D. Date of Service: 2023-10-30 Radiation Oncologist: Marcey Penton, M.D. [facility]                             RADIATION ONCOLOGY END OF TREATMENT NOTE     Diagnosis: C61 Malignant neoplasm of prostate Intent: Curative     HPI: Patient is a 78 year old male who presented with a PSA in the 12 range back in March 2025.  He underwent an MRI prostate showing 2 PI-RADS category 4 lesions in the anterior transition zone.  No evidence of pelvic adenopathy or bone metastasis on MRI scan was noted.  He underwent neuronavigational biopsy showing 10 of 14 cores positive for adenocarcinoma a mixture of Gleason 9 (4+5) Gleason 8 (4+4) and Gleason 6 (3+3).  He had a PSMA PET scan performed showing intense radiotracer activity in the anterior apical region of the prostate gland consistent with primary prostate adenocarcinoma no evidence of metastatic adenopathy in the pelvis or periaortic lymph nodes.  No evidence of visceral metastasis or skeletal metastasis.  Patient is fairly asymptomatic does have nocturia x 3-4 no bone pain.  He has no bowel problems at this time.  He is seen today for radiation oncology opinion.      ==========DELIVERED PLANS==========  First Treatment Date: 2023-09-21 Last Treatment Date: 2023-10-29   Plan Name: Prostate Site: Prostate Technique: IMRT Mode: Photon Dose Per Fraction: 2.5 Gy Prescribed Dose (Delivered / Prescribed): 70 Gy / 70 Gy Prescribed Fxs (Delivered / Prescribed): 28 / 28     ==========ON TREATMENT VISIT DATES========== 2023-09-22, 2023-09-29, 2023-10-06, 2023-10-13, 2023-10-20, 2023-10-27     ==========UPCOMING VISITS========== 12/09/2023 [facility] RAD ONCOLOGY FOLLOW UP 30 Chrystal, Marcey, MD  12/07/2023 [facility] REMOTE PACER CK [facility] HVT DEVICE REMOTES  11/06/2023 [facility] PHYS  PACER CK Riddle, Suzann, NP        ==========APPENDIX - ON TREATMENT VISIT NOTES==========   See weekly On Treatment Notes in Epic for details in the Media tab (listed as Progress notes on the On Treatment Visit Dates listed above).

## 2023-11-02 ENCOUNTER — Ambulatory Visit

## 2023-11-03 ENCOUNTER — Ambulatory Visit

## 2023-11-04 ENCOUNTER — Encounter: Payer: Self-pay | Admitting: Cardiology

## 2023-11-04 ENCOUNTER — Ambulatory Visit: Attending: Cardiology | Admitting: Cardiology

## 2023-11-04 ENCOUNTER — Ambulatory Visit

## 2023-11-04 VITALS — BP 134/78 | HR 71 | Ht 63.5 in | Wt 186.8 lb

## 2023-11-04 DIAGNOSIS — Z950 Presence of cardiac pacemaker: Secondary | ICD-10-CM

## 2023-11-04 DIAGNOSIS — I471 Supraventricular tachycardia, unspecified: Secondary | ICD-10-CM | POA: Diagnosis not present

## 2023-11-04 DIAGNOSIS — I493 Ventricular premature depolarization: Secondary | ICD-10-CM | POA: Diagnosis not present

## 2023-11-04 DIAGNOSIS — I495 Sick sinus syndrome: Secondary | ICD-10-CM | POA: Diagnosis not present

## 2023-11-04 NOTE — Patient Instructions (Signed)
 Medication Instructions:  Your physician recommends that you continue on your current medications as directed. Please refer to the Current Medication list given to you today.  *If you need a refill on your cardiac medications before your next appointment, please call your pharmacy*  Lab Work: No labs ordered today  If you have labs (blood work) drawn today and your tests are completely normal, you will receive your results only by: MyChart Message (if you have MyChart) OR A paper copy in the mail If you have any lab test that is abnormal or we need to change your treatment, we will call you to review the results.  Testing/Procedures: No test ordered today   Follow-Up: At [facility], you and your health needs are our priority.  As part of our continuing mission to provide you with exceptional heart care, our providers are all part of one team.  This team includes your primary Cardiologist (physician) and Advanced Practice Providers or APPs (Physician Assistants and Nurse Practitioners) who all work together to provide you with the care you need, when you need it.  Your next appointment:   12 month(s)  Provider:  [name], NP  We recommend signing up for the patient portal called MyChart.  Sign up information is provided on this After Visit Summary.  MyChart is used to connect with patients for Virtual Visits (Telemedicine).  Patients are able to view lab/test results, encounter notes, upcoming appointments, etc.  Non-urgent messages can be sent to your provider as well.   To learn more about what you can do with MyChart, go to [email or web address].

## 2023-11-04 NOTE — Progress Notes (Signed)
 Electrophysiology Clinic Note    Date:  11/04/2023  Patient ID:  Bobby Woods, Bobby Woods, Bobby Woods, Bobby Woods PCP:  Jimmy Charlie FERNS, MD  Cardiologist:  Redell Cave, MD  Electrophysiologist:  OLE ONEIDA HOLTS, MD  Electrophysiology APP:  Mackayla Mullins, NP    Discussed the use of AI scribe software for clinical note transcription with the patient, who gave verbal consent to proceed.   Patient Profile    Chief Complaint: 6mon follow-up  History of Present Illness: Bobby Woods is a 78 y.o. male with PMH notable for parox SVT, tachy-brady s/p PPM, PVCs, HTN ; seen today for OLE ONEIDA HOLTS, MD for routine electrophysiology followup.   He is overall doing well from a cardiac standpoint. He continues to have rare chest discomfort that has not happened in several weeks. He describes the episodes as chest soreness, previously associated with PVCs on monitor.  He recently completed radiation for prostate cancer, has been out of work for 7 months. He is having frequent nocturia, got up 14 times last night. He says this is expected and should improve with time.   He denies chest pain, chest pressure, palpitations today. No edema, SOB or presyncope.    Arrhythmia/Device History MDT dual chamber PPM, imp 09/2019; dx SND    ROS:  Please see the history of present illness. All other systems are reviewed and otherwise negative.    Physical Exam    VS:  BP 134/78   Pulse 71   Ht 5' 3.5 (1.613 m)   Wt 186 lb 12.8 oz (84.7 kg)   SpO2 98%   BMI 32.57 kg/m  BMI: Body mass index is 32.57 kg/m.           Wt Readings from Last 3 Encounters:  10/22/Woods 186 lb 12.8 oz (84.7 kg)  08/20/Woods 182 lb 9.6 oz (82.8 kg)  08/05/Woods 183 lb (83 kg)     GEN- The patient is well appearing, alert and oriented x 3 today.   Lungs- Clear to ausculation bilaterally, normal work of breathing.  Heart- Regular rate and rhythm, no murmurs, rubs or gallops Extremities- No peripheral  edema, warm, dry Skin-  device pocket well-healed, no tethering    Brief check performed without iterative lead testing/measurements Very brief SVT episodes noted, less than 3 seconds.  08/2023 Remote interrogation reviewed. Battery good, lead measurements stable   Studies Reviewed   Previous EP, cardiology notes.    EKG is ordered. Personal review of EKG from today shows:    EKG Interpretation Date/Time:  Wednesday November 04 2023 11:08:11 EDT Ventricular Rate:  71 PR Interval:  200 QRS Duration:  74 QT Interval:  402 QTC Calculation: 436 R Axis:   -1  Text Interpretation: Atrial-paced rhythm Confirmed by Royale Lennartz [phone]) on 11/04/2023 11:11:16 AM    Long term monitor, 07/10/2022 Paroxysmal SVT noted, longest lasting 19 beats, fastest lasting 9 seconds. Overall PVCs with rare less than 1%, PACs also rare less than 1%.   TTE, 07/07/2022  1. Left ventricular ejection fraction, by estimation, is 60 to 65%. The left ventricle has normal function. The left ventricle has no regional wall motion abnormalities. There is mild left ventricular hypertrophy. Left ventricular diastolic parameters are consistent with Grade I diastolic dysfunction (impaired relaxation). The average left ventricular global longitudinal strain is -17.6 %. The global longitudinal strain is normal.   2. Right ventricular systolic function is normal. The right ventricular size is normal.   3. The  mitral valve is normal in structure. Mild mitral valve regurgitation.   4. The aortic valve is tricuspid. Aortic valve regurgitation is not visualized.   5. The inferior vena cava is normal in size with greater than 50% respiratory variability, suggesting right atrial pressure of 3 mmHg.      Assessment and Plan     #) SVT #) PVC #) tachy-brady s/p PPM Device functioning well Very brief, rare SVT episodes noted less than 3 seconds Continue 50mg  toprol  BID Low VP         Current medicines are reviewed  at length with the patient today.   The patient does not have concerns regarding his medicines.  The following changes were made today:  none  Labs/ tests ordered today include:  Orders Placed This Encounter  Procedures   EKG 12-Lead     Disposition: Follow up with EP Team or EP APP in 12 months  Follow up with Dr. Darliss or gen cards APP in ~6 months as previously recommended   Signed, Jheremy Boger, NP  10/22/Woods  11:48 AM  Electrophysiology [facility]

## 2023-11-05 ENCOUNTER — Inpatient Hospital Stay

## 2023-11-05 ENCOUNTER — Ambulatory Visit

## 2023-11-06 ENCOUNTER — Ambulatory Visit: Admitting: Cardiology

## 2023-11-06 ENCOUNTER — Ambulatory Visit

## 2023-11-09 ENCOUNTER — Ambulatory Visit

## 2023-11-10 ENCOUNTER — Ambulatory Visit

## 2023-11-11 ENCOUNTER — Ambulatory Visit

## 2023-11-12 ENCOUNTER — Ambulatory Visit

## 2023-11-13 ENCOUNTER — Ambulatory Visit

## 2023-11-15 DIAGNOSIS — D6832 Hemorrhagic disorder due to extrinsic circulating anticoagulants: Secondary | ICD-10-CM | POA: Diagnosis not present

## 2023-11-15 DIAGNOSIS — M25512 Pain in left shoulder: Secondary | ICD-10-CM | POA: Diagnosis not present

## 2023-12-07 ENCOUNTER — Ambulatory Visit (INDEPENDENT_AMBULATORY_CARE_PROVIDER_SITE_OTHER): Payer: Medicare PPO

## 2023-12-07 ENCOUNTER — Ambulatory Visit: Admitting: Radiation Oncology

## 2023-12-07 DIAGNOSIS — I493 Ventricular premature depolarization: Secondary | ICD-10-CM | POA: Diagnosis not present

## 2023-12-08 LAB — CUP PACEART REMOTE DEVICE CHECK
Battery Remaining Longevity: 107 mo
Battery Voltage: 3 V
Brady Statistic AP VP Percent: 0.1 %
Brady Statistic AP VS Percent: 97.08 %
Brady Statistic AS VP Percent: 0 %
Brady Statistic AS VS Percent: 2.82 %
Brady Statistic RA Percent Paced: 97.18 %
Brady Statistic RV Percent Paced: 0.1 %
Date Time Interrogation Session: 20251123212001
Implantable Lead Connection Status: 753985
Implantable Lead Connection Status: 753985
Implantable Lead Implant Date: 20210903
Implantable Lead Implant Date: 20210903
Implantable Lead Location: 753859
Implantable Lead Location: 753860
Implantable Lead Model: 5076
Implantable Lead Model: 5076
Implantable Pulse Generator Implant Date: 20210903
Lead Channel Impedance Value: 304 Ohm
Lead Channel Impedance Value: 342 Ohm
Lead Channel Impedance Value: 361 Ohm
Lead Channel Impedance Value: 399 Ohm
Lead Channel Pacing Threshold Amplitude: 0.5 V
Lead Channel Pacing Threshold Amplitude: 0.625 V
Lead Channel Pacing Threshold Pulse Width: 0.4 ms
Lead Channel Pacing Threshold Pulse Width: 0.4 ms
Lead Channel Sensing Intrinsic Amplitude: 2.25 mV
Lead Channel Sensing Intrinsic Amplitude: 2.25 mV
Lead Channel Sensing Intrinsic Amplitude: 7.125 mV
Lead Channel Sensing Intrinsic Amplitude: 7.125 mV
Lead Channel Setting Pacing Amplitude: 1.5 V
Lead Channel Setting Pacing Amplitude: 2 V
Lead Channel Setting Pacing Pulse Width: 0.4 ms
Lead Channel Setting Sensing Sensitivity: 1.2 mV
Zone Setting Status: 755011
Zone Setting Status: 755011

## 2023-12-09 ENCOUNTER — Ambulatory Visit
Admission: EM | Admit: 2023-12-09 | Discharge: 2023-12-09 | Disposition: A | Discharge status: Home / Self Care | Attending: Emergency Medicine | Admitting: Emergency Medicine

## 2023-12-09 ENCOUNTER — Ambulatory Visit: Admitting: Radiation Oncology

## 2023-12-09 ENCOUNTER — Encounter: Payer: Self-pay | Admitting: Emergency Medicine

## 2023-12-09 ENCOUNTER — Ambulatory Visit: Payer: Self-pay

## 2023-12-09 DIAGNOSIS — J22 Unspecified acute lower respiratory infection: Secondary | ICD-10-CM | POA: Diagnosis not present

## 2023-12-09 MED ORDER — DOXYCYCLINE HYCLATE 100 MG PO CAPS: 100.0000 mg | ORAL_CAPSULE | Freq: Two times a day (BID) | ORAL | 0 refills | Status: AC

## 2023-12-09 NOTE — Discharge Instructions (Signed)
 Take antibiotic as directed. Drink plenty of water. Eat some yogurt while taking antibiotic as any antibiotic may cause upset stomach. If you have any new or worsening issues chest pain, shortness of breath, or palpations go to Er for further evaluation.

## 2023-12-09 NOTE — ED Provider Notes (Signed)
 [facility]    CSN: [id number] Arrival date & time: 12/09/23  1335      History   Chief Complaint Chief Complaint  Patient presents with   Sore Throat   Cough   Nasal Congestion    HPI Bobby Woods is a 78 y.o. male.   78 year old male pt, Bobby Woods, presents to urgent care for evaluation of cough and sore throat for 5 days. Pt has been around several sick family members recently and has been receiving radiation treatment for prostate cancer.  Patient has used cough drops for his symptoms.   The history is provided by the patient. No language interpreter was used.    Past Medical History:  Diagnosis Date   Arthritis    Basal cell carcinoma (BCC)    Blood transfusion without reported diagnosis    age 63 year    BPH (benign prostatic hypertrophy)    Chronic kidney disease    GERD (gastroesophageal reflux disease)    Hypertension    on medicines    Prostate cancer [facility])     Patient Active Problem List   Diagnosis Date Noted   Acute respiratory infection 12/09/2023   Prostate cancer (HCC) 08/18/2023   Basal cell carcinoma (BCC)    Lumbar muscle pain 10/01/2022   Mood disorder 08/12/2022   Right knee pain 08/12/2022   History of stroke 08/02/2021   Eustachian tube dysfunction, right 02/19/2021   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 11/29/2020   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   Pacemaker 12/21/2019   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   Chronic renal disease, stage III (HCC) 07/20/2018   Lower back pain 11/10/2014   Advance directive discussed with patient 04/25/2014   Routine general medical examination at a health care facility 01/22/2011   Actinic keratoses 01/22/2011   GERD (gastroesophageal reflux disease)    BPH with obstruction/lower urinary tract symptoms 01/03/2009   Osteoarthritis 12/14/2006   Essential hypertension, benign 05/25/2006    Past Surgical History:  Procedure Laterality Date    COLONOSCOPY  2006   HAND SURGERY     INGUINAL HERNIA REPAIR  1965   PACEMAKER IMPLANT N/A 09/16/2019   Procedure: PACEMAKER IMPLANT;  Surgeon: Cindie Ole DASEN, MD;  Location: [facility];  Service: Cardiovascular;  Laterality: N/A;   PROSTATE BIOPSY  2011   negative   RETINAL DETACHMENT SURGERY  1985   left, right 05/07   ROTATOR CUFF REPAIR  10/09   right   TONSILLECTOMY  1972       Home Medications    Prior to Admission medications   Medication Sig Start Date End Date Taking? Authorizing Provider  doxycycline  (VIBRAMYCIN ) 100 MG capsule Take 1 capsule (100 mg total) by mouth 2 (two) times daily for 7 days. 12/09/23 12/16/23 Yes Camyah Pultz, NP  ALPRAZolam  (XANAX ) 0.25 MG tablet Take 1 tablet (0.25 mg total) by mouth 2 (two) times daily as needed for anxiety. Patient not taking: Reported on 11/04/2023 08/12/22   Jimmy Charlie FERNS, MD  amLODipine  (NORVASC ) 5 MG tablet Take 1 tablet (5 mg total) by mouth daily. 07/20/23   Darliss Rogue, MD  clopidogrel  (PLAVIX ) 75 MG tablet Take 1 tablet (75 mg total) by mouth daily. 10/02/23   Webb, Padonda B, FNP  cyanocobalamin  (VITAMIN B12) 1000 MCG tablet Take 1 tablet (1,000 mcg total) by mouth daily. 07/02/23   Jimmy Charlie FERNS, MD  finasteride  (PROSCAR ) 5 MG tablet TAKE 1 TABLET BY  MOUTH EVERY DAY 12/08/22   Jimmy Charlie FERNS, MD  gabapentin (NEURONTIN) 100 MG capsule Take 100 mg by mouth 2 (two) times daily. Patient not taking: Reported on 11/04/2023 08/12/23   [provider]  hydrocortisone  (ANUSOL -HC) 2.5 % rectal cream Place 1 Application rectally at bedtime. 08/29/22   Abran Norleen SAILOR, MD  magnesium  oxide (MAG-OX) 400 MG tablet TAKE 1 CAPSULE BY MOUTH DAILY. 04/28/23   Riddle, Suzann, NP  methylPREDNISolone (MEDROL DOSEPAK) 4 MG TBPK tablet Take by mouth. Patient not taking: Reported on 11/04/2023 08/12/23   [provider]  metoprolol  succinate (TOPROL -XL) 50 MG 24 hr tablet Take 1 tablet (50 mg total) by  mouth 2 (two) times daily. Take with or immediately following a meal. 09/21/23   Darliss Rogue, MD  pantoprazole  (PROTONIX ) 40 MG tablet TAKE 1 TABLET BY MOUTH EVERY DAY 09/15/23   Webb, Padonda B, FNP  tamsulosin  (FLOMAX ) 0.4 MG CAPS capsule TAKE 2 CAPSULES (0.8 MG TOTAL) BY MOUTH DAILY AFTER SUPPER. 09/15/23   Webb, Padonda B, FNP  Trospium  Chloride 60 MG CP24 Take 1 capsule (60 mg total) by mouth daily. 08/13/23   Vaillancourt, Samantha, PA-C    Family History Family History  Problem Relation Age of Onset   Cancer Father        prostate   Cancer Paternal Uncle        prostate cancer   Colon cancer Neg Hx    Rectal cancer Neg Hx    Stomach cancer Neg Hx     Social History Social History   Tobacco Use   Smoking status: Never    Passive exposure: Past   Smokeless tobacco: Never  Vaping Use   Vaping status: Never Used  Substance Use Topics   Alcohol use: No   Drug use: No     Allergies   Amoxicillin  and Azithromycin  dihydrate   Review of Systems Review of Systems  HENT:  Positive for congestion and sore throat.   Respiratory:  Positive for cough.   All other systems reviewed and are negative.    Physical Exam Triage Vital Signs ED Triage Vitals  Encounter Vitals Group     BP 12/09/23 1354 117/87     Girls Systolic BP Percentile --      Girls Diastolic BP Percentile --      Boys Systolic BP Percentile --      Boys Diastolic BP Percentile --      Pulse Rate 12/09/23 1354 74     Resp 12/09/23 1354 16     Temp 12/09/23 1354 98.4 F (36.9 C)     Temp Source 12/09/23 1354 Oral     SpO2 12/09/23 1354 99 %     Weight 12/09/23 1352 185 lb (83.9 kg)     Height --      Head Circumference --      Peak Flow --      Pain Score 12/09/23 1353 0     Pain Loc --      Pain Education --      Exclude from Growth Chart --    No data found.  Updated Vital Signs BP 117/87 (BP Location: Left Arm)   Pulse 74   Temp 98.4 F (36.9 C) (Oral)   Resp 16   Wt 185 lb (83.9  kg)   SpO2 99%   BMI 32.26 kg/m   Visual Acuity Right Eye Distance:   Left Eye Distance:   Bilateral Distance:    Right  Eye Near:   Left Eye Near:    Bilateral Near:     Physical Exam Vitals and nursing note reviewed.  Constitutional:      General: He is not in acute distress.    Appearance: He is well-developed. He is not ill-appearing or toxic-appearing.  HENT:     Head: Normocephalic.     Right Ear: Tympanic membrane is retracted.     Left Ear: Tympanic membrane is retracted.     Nose: Mucosal edema and congestion present.     Right Sinus: Maxillary sinus tenderness present.     Left Sinus: Maxillary sinus tenderness present.     Mouth/Throat:     Lips: Pink.     Mouth: Mucous membranes are moist.     Pharynx: Uvula midline. Postnasal drip present.  Eyes:     General: Lids are normal.     Conjunctiva/sclera: Conjunctivae normal.     Pupils: Pupils are equal, round, and reactive to light.  Cardiovascular:     Rate and Rhythm: Normal rate and regular rhythm.     Heart sounds: Normal heart sounds.  Pulmonary:     Effort: Pulmonary effort is normal. No respiratory distress.     Breath sounds: Normal breath sounds and air entry. No decreased breath sounds or wheezing.  Abdominal:     General: There is no distension.     Palpations: Abdomen is soft.  Musculoskeletal:        General: Normal range of motion.     Cervical back: Normal range of motion.  Skin:    General: Skin is warm and dry.     Findings: No rash.  Neurological:     General: No focal deficit present.     Mental Status: He is alert and oriented to person, place, and time.     GCS: GCS eye subscore is 4. GCS verbal subscore is 5. GCS motor subscore is 6.     Cranial Nerves: No cranial nerve deficit.     Sensory: No sensory deficit.  Psychiatric:        Speech: Speech normal.        Behavior: Behavior normal. Behavior is cooperative.      UC Treatments / Results  Labs (all labs ordered are  listed, but only abnormal results are displayed) Labs Reviewed - No data to display  EKG   Radiology No results found.  Procedures Procedures (including critical care time)  Medications Ordered in UC Medications - No data to display  Initial Impression / Assessment and Plan / UC Course  I have reviewed the triage vital signs and the nursing notes.  Pertinent labs & imaging results that were available during my care of the patient were reviewed by me and considered in my medical decision making (see chart for details).    Discussed exam findings and plan of care with patient, will treat with doxycycline  due to exam and hx(cancer), strict go to ER precautions given.   Patient verbalized understanding to this provider.  Ddx: Acute respiratory infection, allergies,viral illness.  Final Clinical Impressions(s) / UC Diagnoses   Final diagnoses:  Acute respiratory infection     Discharge Instructions      Take antibiotic as directed. Drink plenty of water. Eat some yogurt while taking antibiotic as any antibiotic may cause upset stomach. If you have any new or worsening issues chest pain, shortness of breath, or palpations go to Er for further evaluation.      ED Prescriptions  Medication Sig Dispense Auth. Provider   doxycycline  (VIBRAMYCIN ) 100 MG capsule Take 1 capsule (100 mg total) by mouth 2 (two) times daily for 7 days. 14 capsule Bellanie Matthew, NP      PDMP not reviewed this encounter.   Aminta Loose, NP 12/09/23 [phone]

## 2023-12-09 NOTE — Telephone Encounter (Signed)
 FYI Only or Action Required?: FYI only for provider: UC advised.  Patient was last seen in primary care on 08/18/2023 by Jimmy Charlie FERNS, MD.  Called Nurse Triage reporting Cough and Sore Throat.  Symptoms began several days ago.  Interventions attempted: Nothing.  Symptoms are: stable.  Triage Disposition: See PCP When Office is Open (Within 3 Days)  Patient/caregiver understands and will follow disposition?: Yes Reason for Disposition  [1] Sore throat with cough/cold symptoms AND [2] present > 5 days  Answer Assessment - Initial Assessment Questions Advised UC for symptoms, no appointments in PCP offices until next week.  patient stated he will go to UC  1. ONSET: When did the throat start hurting? (Hours or days ago)      Sunday  2. SEVERITY: How bad is the sore throat? (Scale 1-10; mild, moderate or severe)     Worse as of last night, pretty bad  3.  VIRAL SYMPTOMS: Are there any symptoms of a cold, such as a runny nose, cough, hoarse voice or red eyes?      Dry cough, sneezing, blowing nose a lot, states its clear  4. FEVER: Do you have a fever? If Yes, ask: What is your temperature, how was it measured, and when did it start?     Denies  5. PUS ON THE TONSILS: Is there pus on the tonsils in the back of your throat?     Unsure, has not looked  6. OTHER SYMPTOMS: Do you have any other symptoms? (e.g., difficulty breathing, headache, rash)     Denies  Protocols used: Sore Throat-A-AH  Copied from CRM #[id number]. Topic: Clinical - Red Word Triage >> Dec 09, 2023  8:31 AM Adelita E wrote: [facility] that prompted transfer to Nurse Triage: Worsening throat pain since this past Sunday 11/23. Patient also experiencing a dry cough.

## 2023-12-09 NOTE — Progress Notes (Signed)
 Remote PPM Transmission

## 2023-12-09 NOTE — ED Triage Notes (Signed)
 Pt presents with a cough, congestion and sore throat x 3 days. Pt has used a cough drop for his symptoms.

## 2023-12-14 ENCOUNTER — Ambulatory Visit: Payer: Self-pay | Admitting: Cardiology

## 2023-12-14 ENCOUNTER — Other Ambulatory Visit: Payer: Self-pay | Admitting: Internal Medicine

## 2023-12-14 MED ORDER — FINASTERIDE 5 MG PO TABS: 5.0000 mg | ORAL_TABLET | Freq: Every day | ORAL | 2 refills | Status: AC

## 2023-12-14 NOTE — Telephone Encounter (Signed)
 Called and spoke to pt. Advised him we have sent in the med for him.

## 2023-12-14 NOTE — Telephone Encounter (Signed)
 Copied from CRM #[id number]. Topic: Clinical - Prescription Issue >> Dec 14, 2023 12:35 PM Nessti S wrote: Reason for CRM: pt called in because he needs a refill for finasteride  (PROSCAR ) 5 MG tablet and has been out of meds for 3 days. He will like a call back [phone]

## 2023-12-23 ENCOUNTER — Other Ambulatory Visit: Payer: Self-pay | Admitting: *Deleted

## 2023-12-23 ENCOUNTER — Ambulatory Visit
Admission: RE | Admit: 2023-12-23 | Discharge: 2023-12-23 | Disposition: A | Discharge status: Home / Self Care | Source: Ambulatory Visit | Attending: Radiation Oncology | Admitting: Radiation Oncology

## 2023-12-23 ENCOUNTER — Encounter: Payer: Self-pay | Admitting: Radiation Oncology

## 2023-12-23 VITALS — BP 131/81 | HR 86 | Temp 98.0°F | Resp 16 | Wt 182.0 lb

## 2023-12-23 DIAGNOSIS — Z923 Personal history of irradiation: Secondary | ICD-10-CM | POA: Diagnosis not present

## 2023-12-23 DIAGNOSIS — C61 Malignant neoplasm of prostate: Secondary | ICD-10-CM | POA: Diagnosis not present

## 2023-12-23 NOTE — Progress Notes (Signed)
 Radiation Oncology Follow up Note  Name: Bobby Woods   Date:   12/23/2023 MRN:  [id number] DOB: 1945/08/27    This 78 y.o. male presents to the clinic today for 1 month follow-up status post image guided IMRT radiation therapy.  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  For stage IIc Gleason 9 adenocarcinoma the prostate.  Seen today in routine follow-up he is doing well.  He specifically denies any increased lower urinary tract symptoms diarrhea or fatigue.  Patient is currently on finasteride  which was just refilled as well as Flomax .  He is also on ADT therapy.  COMPLICATIONS OF TREATMENT: None  FOLLOW UP COMPLIANCE: keeps appointments   PHYSICAL EXAM:  BP 131/81   Pulse 86   Temp 98 F (36.7 C) (Tympanic)   Resp 16   Wt 182 lb (82.6 kg)   BMI 31.73 kg/m  Well-developed well-nourished patient in NAD. HEENT reveals PERLA, EOMI, discs not visualized.  Oral cavity is clear. No oral mucosal lesions are identified. Neck is clear without evidence of cervical or supraclavicular adenopathy. Lungs are clear to A&P. Cardiac examination is essentially unremarkable with regular rate and rhythm without murmur rub or thrill. Abdomen is benign with no organomegaly or masses noted. Motor sensory and DTR levels are equal and symmetric in the upper and lower extremities. Cranial nerves II through XII are grossly intact. Proprioception is intact. No peripheral adenopathy or edema is identified. No motor or sensory levels are noted. Crude visual fields are within normal range.  RADIOLOGY RESULTS: No current films for review  PLAN: At the present time patient is doing well.  I have asked to see him back in 3 months with a PSA prior to the visit.  I also would  recommend at least 2 years of additional ADT therapy through urology.  He continues follow-up care with urology.  Patient knows to call with any concerns.  I would like to take this opportunity to thank you for allowing me to participate in the care of your patient.SABRA Marcey Penton, MD

## 2024-01-18 ENCOUNTER — Other Ambulatory Visit: Payer: Self-pay | Admitting: Family Medicine

## 2024-01-18 MED ORDER — CLOPIDOGREL BISULFATE 75 MG PO TABS: 75.0000 mg | ORAL_TABLET | Freq: Every day | ORAL | 1 refills | Status: AC

## 2024-01-18 NOTE — Telephone Encounter (Signed)
 Copied from CRM [id number]. Topic: Clinical - Medication Refill >> Jan 18, 2024 11:25 AM Alfonso HERO wrote: Medication: clopidogrel  (PLAVIX ) 75 MG tablet  Has the patient contacted their pharmacy? Yes (Agent: If no, request that the patient contact the pharmacy for the refill. If patient does not wish to contact the pharmacy document the reason why and proceed with request.) (Agent: If yes, when and what did the pharmacy advise?)  This is the patient's preferred pharmacy:  [id number] - [place], [place] - [address] [place] [address] Phone: [phone] Fax: [phone]  Is this the correct pharmacy for this prescription? Yes If no, delete pharmacy and type the correct one.   Has the prescription been filled recently? Yes  Is the patient out of the medication? Yes  Has the patient been seen for an appointment in the last year OR does the patient have an upcoming appointment? Yes  Can we respond through MyChart? Yes  Agent: Please be advised that Rx refills may take up to 3 business days. We ask that you follow-up with your pharmacy.

## 2024-01-18 NOTE — Telephone Encounter (Signed)
 Sent. Thanks.

## 2024-02-10 ENCOUNTER — Telehealth: Payer: Self-pay | Admitting: Physician Assistant

## 2024-02-10 NOTE — Telephone Encounter (Signed)
 Patient presented to clinic stating his Trospium  Chloride 60 MG CP24 refill cost is $475. Pharmacy advised patient that Oxybutynin or Solifenacin would be covered at no cost as alternatives.  Patient is requesting a prescription for one of these alternatives to be sent to his pharmacy if appropriate. He reports having enough current medication to last through Sunday.  Please advise .

## 2024-02-11 NOTE — Telephone Encounter (Signed)
 Spoke with patient and informed him of previous message. Patient states he is willing to try the Trosprium IR 20mg  twice daily. Patient stated he will be in touch if the price is too expensive for him.

## 2024-02-11 NOTE — Telephone Encounter (Signed)
 The safest alternative for him would be trospium  IR 20 mg twice daily.  Oxybutynin and solifenacin cross the blood-brain barrier and carry long-term risks of cognitive impairment or dementia.  I do not recommend trying either of those.

## 2024-02-16 ENCOUNTER — Other Ambulatory Visit: Payer: Self-pay

## 2024-02-16 DIAGNOSIS — N401 Enlarged prostate with lower urinary tract symptoms: Secondary | ICD-10-CM

## 2024-02-16 MED ORDER — TROSPIUM CHLORIDE 20 MG PO TABS: 20.0000 mg | ORAL_TABLET | Freq: Two times a day (BID) | ORAL | 11 refills | Status: AC

## 2024-02-16 NOTE — Telephone Encounter (Signed)
 Medication sent to pharmacy

## 2024-02-26 ENCOUNTER — Ambulatory Visit: Admitting: Physician Assistant

## 2024-03-07 ENCOUNTER — Ambulatory Visit: Payer: Medicare PPO

## 2024-03-15 ENCOUNTER — Inpatient Hospital Stay

## 2024-03-22 ENCOUNTER — Ambulatory Visit: Admitting: Radiation Oncology

## 2024-03-23 ENCOUNTER — Ambulatory Visit: Admitting: Radiation Oncology

## 2024-06-16 ENCOUNTER — Ambulatory Visit

## 2024-06-17 ENCOUNTER — Ambulatory Visit

## 2024-08-18 ENCOUNTER — Encounter: Admitting: Family Medicine
